# Patient Record
Sex: Male | Born: 1975 | Race: Black or African American | Hispanic: No | Marital: Single | Smoking: Never smoker
Health system: Southern US, Community
[De-identification: ages and names within clinical notes are randomized; demographics above are authoritative.]

## PROBLEM LIST (undated history)

## (undated) DIAGNOSIS — E7800 Pure hypercholesterolemia, unspecified: Secondary | ICD-10-CM

## (undated) DIAGNOSIS — E119 Type 2 diabetes mellitus without complications: Secondary | ICD-10-CM

## (undated) DIAGNOSIS — D649 Anemia, unspecified: Secondary | ICD-10-CM

## (undated) DIAGNOSIS — E559 Vitamin D deficiency, unspecified: Secondary | ICD-10-CM

## (undated) DIAGNOSIS — B338 Other specified viral diseases: Secondary | ICD-10-CM

## (undated) DIAGNOSIS — K602 Anal fissure, unspecified: Secondary | ICD-10-CM

## (undated) DIAGNOSIS — E538 Deficiency of other specified B group vitamins: Secondary | ICD-10-CM

## (undated) DIAGNOSIS — A048 Other specified bacterial intestinal infections: Secondary | ICD-10-CM

## (undated) DIAGNOSIS — I10 Essential (primary) hypertension: Secondary | ICD-10-CM

## (undated) DIAGNOSIS — G4733 Obstructive sleep apnea (adult) (pediatric): Secondary | ICD-10-CM

## (undated) DIAGNOSIS — E349 Endocrine disorder, unspecified: Secondary | ICD-10-CM

## (undated) HISTORY — DX: Other specified bacterial intestinal infections: A04.8

## (undated) HISTORY — DX: Deficiency of other specified B group vitamins: E53.8

## (undated) HISTORY — DX: Obstructive sleep apnea (adult) (pediatric): G47.33

## (undated) HISTORY — DX: Anal fissure, unspecified: K60.2

## (undated) HISTORY — DX: Other specified viral diseases: B33.8

## (undated) HISTORY — DX: Vitamin D deficiency, unspecified: E55.9

## (undated) HISTORY — DX: Morbid (severe) obesity due to excess calories: E66.01

## (undated) HISTORY — DX: Endocrine disorder, unspecified: E34.9

## (undated) HISTORY — DX: Anemia, unspecified: D64.9

## (undated) HISTORY — DX: Hypomagnesemia: E83.42

## (undated) HISTORY — DX: Type 2 diabetes mellitus without complications: E11.9

## (undated) HISTORY — PX: KNEE ARTHROSCOPY W/ ACL RECONSTRUCTION: SHX1858

---

## 2015-05-23 ENCOUNTER — Emergency Department (HOSPITAL_BASED_OUTPATIENT_CLINIC_OR_DEPARTMENT_OTHER): Payer: Self-pay

## 2015-05-23 ENCOUNTER — Encounter (HOSPITAL_BASED_OUTPATIENT_CLINIC_OR_DEPARTMENT_OTHER): Payer: Self-pay | Admitting: *Deleted

## 2015-05-23 ENCOUNTER — Emergency Department (HOSPITAL_BASED_OUTPATIENT_CLINIC_OR_DEPARTMENT_OTHER)
Admission: EM | Admit: 2015-05-23 | Discharge: 2015-05-23 | Disposition: A | Discharge status: Home / Self Care | Payer: Self-pay | Attending: Emergency Medicine | Admitting: Emergency Medicine

## 2015-05-23 DIAGNOSIS — S62309A Unspecified fracture of unspecified metacarpal bone, initial encounter for closed fracture: Secondary | ICD-10-CM

## 2015-05-23 DIAGNOSIS — Y9389 Activity, other specified: Secondary | ICD-10-CM | POA: Insufficient documentation

## 2015-05-23 DIAGNOSIS — S62336A Displaced fracture of neck of fifth metacarpal bone, right hand, initial encounter for closed fracture: Secondary | ICD-10-CM | POA: Insufficient documentation

## 2015-05-23 DIAGNOSIS — W231XXA Caught, crushed, jammed, or pinched between stationary objects, initial encounter: Secondary | ICD-10-CM | POA: Insufficient documentation

## 2015-05-23 DIAGNOSIS — Y9289 Other specified places as the place of occurrence of the external cause: Secondary | ICD-10-CM | POA: Insufficient documentation

## 2015-05-23 DIAGNOSIS — Y998 Other external cause status: Secondary | ICD-10-CM | POA: Insufficient documentation

## 2015-05-23 MED ORDER — OXYCODONE-ACETAMINOPHEN 5-325 MG PO TABS: 1.0000 | ORAL_TABLET | Freq: Four times a day (QID) | ORAL | Status: DC | PRN

## 2015-05-23 MED ORDER — KETOROLAC TROMETHAMINE 60 MG/2ML IM SOLN
60.0000 mg | Freq: Once | INTRAMUSCULAR | Status: AC
  Administered 2015-05-23: 60 mg via INTRAMUSCULAR
  Filled 2015-05-23: qty 2
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
## 2015-05-23 NOTE — ED Notes (Signed)
Right hand injury. It was caught in a door 2 days ago. Swelling, pain and bruising.

## 2015-05-23 NOTE — Discharge Instructions (Signed)
Boxer's Fracture °Boxer's fracture is a broken bone (fracture) of the fourth or fifth metacarpal (ring or pinky finger). The metacarpal bones connect the wrist to the fingers and make up the arch of the hand. Boxer's fracture occurs toward the body (proximal) from the first knuckle. This injury is known as a boxer's fracture, because it often occurs from hitting an object with a closed fist. °SYMPTOMS  °· Severe pain at the time of injury. °· Pain and swelling around the first knuckle on the fourth or fifth finger. °· Bruising (contusion) in the area within 48 hours of injury. °· Visible deformity, such as a pushed down knuckle. This can occur if the fracture is complete, and the bone fragments separate enough to distort normal body shape. °· Numbness or paralysis from swelling in the hand, causing pressure on the blood vessels or nerves (uncommon). °CAUSES  °· Direct injury (trauma), such as a striking blow with the fist. °· Indirect stress to the hand, such as twisting or violent muscle contraction (uncommon). °RISK INCREASES WITH: °· Punching an object with an unprotected knuckle. °· Contact sports (football, rugby). °· Sports that require hitting (boxing, martial arts). °· History of bone or joint disease (osteoporosis). °PREVENTION °· Maintain physical fitness: °¨ Muscle strength and flexibility. °¨ Endurance. °¨ Cardiovascular fitness. °· For participation in contact sports, wear proper protective equipment for the hand and make sure it fits properly. °· Learn and use proper technique when hitting or punching. °PROGNOSIS  °When proper treatment is given, to ensure normal alignment of the bones, healing can usually be expected in 4 to 6 weeks. Occasionally, surgery is necessary.  °RELATED COMPLICATIONS  °· Bone does not heal back together (nonunion). °· Bone heals together in an improper position (malunion), causing twisting of the finger when making a fist. °· Chronic pain, stiffness, or swelling of the  hand. °· Excessive bleeding in the hand, causing pressure and injury to nerves and blood vessels (rare). °· Stopping of normal hand growth in children. °· Infection of the wound, if skin is broken over the fracture (open fracture), or at the incision site if surgery is performed. °· Shortening of injured bones. °· Bony bump (prominence) in the palm or loss of shape of the knuckles. °· Pain and weakness when gripping. °· Arthritis of the affected joint, if the fracture goes into the joint, after repeated injury, or after delayed treatment. °· Scarring around the knuckle, and limited motion. °TREATMENT  °Treatment varies, depending on the injury. The place in the hand where the injury occurs has a great deal of motion, which allows the hand to move properly. If the fracture is not aligned properly, this function may be decreased. If the bone ends are in proper alignment, treatment first involves ice and elevation of the injured hand, at or above heart level, to reduce inflammation. Pain medicines help to relieve pain. Treatment also involves restraint by splinting, bandaging, casting, or bracing for 4 or more weeks.  °If the fracture is out of alignment (displaced), or it involves the joint, surgery is usually recommended. Surgery typically involves cutting through the skin to place removable pins, screws, and sometimes plates over the fracture. After surgery, the bone and joint are restrained for 4 or more weeks. After restraint (with or without surgery), stretching and strengthening exercises are needed to regain proper strength and function of the joint. Exercises may be done at home or with the assistance of a therapist. Depending on the sport and position played, a   brace or splint may be recommended when first returning to sports.  °MEDICATION  °· If pain medication is necessary, nonsteroidal anti-inflammatory medications, such as aspirin and ibuprofen, or other minor pain relievers, such as acetaminophen, are  often recommended. °· Do not take pain medication for 7 days before surgery. °· Prescription pain relievers may be necessary. Use only as directed and only as much as you need. °COLD THERAPY °Cold treatment (icing) relieves pain and reduces inflammation. Cold treatment should be applied for 10 to 15 minutes every 2 to 3 hours for inflammation and pain, and immediately after any activity that aggravates your symptoms. Use ice packs or an ice massage. °SEEK MEDICAL CARE IF:  °· Pain, tenderness, or swelling gets worse, despite treatment. °· You experience pain, numbness, or coldness in the hand. °· Blue, gray, or dark color appears in the fingernails. °· You develop signs of infection, after surgery (fever, increased pain, swelling, redness, drainage of fluids, or bleeding in the surgical area). °· You feel you have reinjured the hand. °· New, unexplained symptoms develop. (Drugs used in treatment may produce side effects.) °Document Released: 12/02/2005 Document Revised: 02/24/2012 Document Reviewed: 03/16/2009 °ExitCare® Patient Information ©2015 [employer]. This information is not intended to replace advice given to you by your health care provider. Make sure you discuss any questions you have with your health care provider. ° °

## 2015-06-01 ENCOUNTER — Ambulatory Visit: Payer: Self-pay | Attending: Orthopedic Surgery | Admitting: Occupational Therapy

## 2015-06-01 ENCOUNTER — Encounter: Payer: Self-pay | Admitting: Occupational Therapy

## 2015-06-01 DIAGNOSIS — M25641 Stiffness of right hand, not elsewhere classified: Secondary | ICD-10-CM | POA: Insufficient documentation

## 2015-06-01 DIAGNOSIS — M79641 Pain in right hand: Secondary | ICD-10-CM | POA: Insufficient documentation

## 2015-06-01 NOTE — Therapy (Signed)
[facility] [address] [place], [place], [address] Phone: [phone]   Fax:  [phone]  Occupational Therapy Evaluation  Patient Details  Name: Dylan Munoz MRN: [id number] Date of Birth: 13-Dec-1976 Referring Provider:  Betha Loa, MD  Encounter Date: 06/01/2015      OT End of Session - 06/01/15 0934    Visit Number 1   Date for OT Re-Evaluation 08/01/15   Authorization Type self pay   OT Start Time 0800   OT Stop Time 0900   OT Time Calculation (min) 60 min   Activity Tolerance Patient tolerated treatment well      History reviewed. No pertinent past medical history.  History reviewed. No pertinent past surgical history.  There were no vitals filed for this visit.  Visit Diagnosis:  Pain in joint, hand, right - Plan: Ot plan of care cert/re-cert  Stiffness of hand joint, right - Plan: Ot plan of care cert/re-cert      Subjective Assessment - 06/01/15 0810    Subjective  I didn't have surgery, but it happened 2 Sunday's ago (June 5th) and then I got the soft cast the following Tuesday (June 7th)   Pertinent History none   Currently in Pain? Yes   Pain Score 7    Pain Location Hand   Pain Orientation Right   Pain Descriptors / Indicators Aching;Throbbing   Pain Type Acute pain   Pain Onset In the past 7 days   Pain Frequency Intermittent   Aggravating Factors  certain movements   Pain Relieving Factors nothing           [facility] OT Assessment - 06/01/15 0001    Assessment   Diagnosis Rt small metacarpal neck fracture  no surgery   Onset Date 05/21/15  Soft cast placed 05/23/15 for immobolization until splint fabrication today   Assessment Pt arrived fully wrapped and protected from 05/23/15   Prior Therapy none   Precautions   Precautions --  splint on at all times (except hygiene care)   Required Braces or Orthoses Other Brace/Splint  ulnar gutter (wrist neutral, MP's flexed, IP's ext)  per MD orders   Home  Environment   Lives With Son  39 y.o.    Prior Function   Level of Independence Independent   Vocation Full time employment   Vocation Requirements working in [employer]   ADL   ADL comments Pt mod Independence with BADLS (wearing slip on shoes and elastic pants). Requires assist from son for IADLS   Written Expression   Dominant Hand Right   Edema   Edema Mild Rt hand                  OT Treatments/Exercises (OP) - 06/01/15 0001    Splinting   Splinting Pt arrived with soft cast, which was carefully removed and hand cleaned/dried while keeping immobolized in same position he arrived. Fabricated and fitted ulnar gutter splint with wrist neutral, MP's flexed and IP's extended (of 4th-5th digits) per MD orders. Issued splint                OT Education - 06/01/15 0904    Education provided Yes   Education Details Splint wear and care, precautions   Person(s) Educated Patient   Methods Explanation;Handout   Comprehension Verbalized understanding          OT Short Term Goals - 06/01/15 0939    OT SHORT TERM GOAL #1   Title Independent  w/ splint wear and care - due 07/01/15   Baseline issued today, may need adjustments/review   Time 4   Period Weeks   Status On-going   OT SHORT TERM GOAL #2   Title Verbalize understanding with precautions of Rt hand   Time 4   Period Weeks   Status Achieved           OT Long Term Goals - 06/01/15 0940    OT LONG TERM GOAL #1   Title Independent w/ HEP (if warranted) - due 08/01/15   Time 8   Period Weeks   Status New   OT LONG TERM GOAL #2   Title Pt to return to using Rt hand as dominant hand for BADLS    Baseline DEPENDENT    Time 8   Period Weeks   Status New   OT LONG TERM GOAL #3   Title Pt to demo composite finger flexion to 90% or greater    Time 8   Period Weeks   Status New               Plan - 06/01/15 0935    Clinical Impression Statement Pt is a 39 y.o. male  who presents to outpatient rehab with Rt small metacarpal neck fracture (no surgery) fully wrapped/protected since June 7th, 2016 per pt report. Pt arrives today for splint fabrication. Issued splint with no current concerns/problems   Pt will benefit from skilled therapeutic intervention in order to improve on the following deficits (Retired) Increased edema;Pain;Decreased range of motion;Decreased strength;Decreased knowledge of precautions;Impaired UE functional use   Rehab Potential Good   OT Frequency 1x / week   OT Duration 8 weeks   OT Treatment/Interventions Self-care/ADL training;Therapeutic exercise;Electrical Stimulation;Moist Heat;Splinting;Fluidtherapy;Scar mobilization;Patient/family education;DME and/or AE instruction;Manual Therapy;Passive range of motion;Therapeutic activities;Ultrasound   Plan splint adjustments prn, will begin f/u therapy if/when MD deems appropriate   Consulted and Agree with Plan of Care Patient        Problem List There are no active problems to display for this patient.   Kelli Churn, OTR/L 06/01/2015, 9:45 AM  [facility] [address] [place], [place], [address] Phone: [phone]   Fax:  [phone]

## 2015-06-01 NOTE — Patient Instructions (Signed)
SPLINT WEAR AND CARE   WEARING SCHEDULE:  Wear splint at ALL times except for hygiene care   PURPOSE:  To prevent movement and for protection until injury can heal  CARE OF SPLINT:  Keep splint away from heat sources including: stove, radiator or furnace, or a car in sunlight. The splint can melt and will no longer fit you properly  Keep away from pets and children  Clean the splint with rubbing alcohol 1-2 times per day.  * During this time, make sure you also clean your hand/arm as instructed by your therapist and/or perform dressing changes as needed. Then dry hand/arm completely before replacing splint. (When cleaning hand/arm, keep it immobilized in same position until splint is replaced)  PRECAUTIONS/POTENTIAL PROBLEMS: *If you notice or experience increased pain, swelling, numbness, or a lingering reddened area from the splint: Contact your therapist immediately by calling [phone]. You must wear the splint for protection, but we will get you scheduled for adjustments as quickly as possible.  (If only straps or hooks need to be replaced and NO adjustments to the splint need to be made, just call the office ahead and let them know you are coming in)  If you have any medical concerns or signs of infection, please call your doctor immediately    

## 2015-07-21 ENCOUNTER — Ambulatory Visit: Payer: Self-pay | Attending: Orthopedic Surgery | Admitting: Occupational Therapy

## 2015-07-21 DIAGNOSIS — M79642 Pain in left hand: Secondary | ICD-10-CM | POA: Insufficient documentation

## 2015-07-21 DIAGNOSIS — M25642 Stiffness of left hand, not elsewhere classified: Secondary | ICD-10-CM | POA: Insufficient documentation

## 2015-07-21 NOTE — Patient Instructions (Signed)
WEARING SCHEDULE:  ?Wear splint at ALL times except for hygiene care  ? ?PURPOSE:  ?To prevent movement and for protection until injury can heal ? ?CARE OF SPLINT:  ?Keep splint away from heat sources including: stove, radiator or furnace, or a car in sunlight. The splint can melt and will no longer fit you properly ? ?Keep away from pets and children ? ?Clean the splint with rubbing alcohol 1-2 times per day.  ?* During this time, make sure you also clean your hand/arm as instructed by your therapist and/or perform dressing changes as needed. Then dry hand/arm completely before replacing splint. (When cleaning hand/arm, keep it immobilized in same position until splint is replaced) ? ?PRECAUTIONS/POTENTIAL PROBLEMS: ?*If you notice or experience increased pain, swelling, numbness, or a lingering reddened area from the splint: ?Contact your therapist immediately by calling [phone]. You must wear the splint for protection, but we will get you scheduled for adjustments as quickly as possible.  ?(If only straps or hooks need to be replaced and NO adjustments to the splint need to be made, just call the office ahead and let them know you are coming in) ? ?If you have any medical concerns or signs of infection, please call your doctor immediately ?  ?

## 2015-07-21 NOTE — Therapy (Signed)
[facility] [address] [place], [place], [address] Phone: [phone]   Fax:  [phone]  Occupational Therapy Evaluation  Patient Details  Name: Dylan Munoz MRN: [id number] Date of Birth: 1976/04/03 Referring Provider:  Betha Loa, MD  Encounter Date: 07/21/2015      OT End of Session - 07/21/15 1245    Visit Number 1   Number of Visits 4   Date for OT Re-Evaluation 09/18/15   Authorization Type self pay   OT Start Time 1130   OT Stop Time 1225   OT Time Calculation (min) 55 min      No past medical history on file.  No past surgical history on file.  There were no vitals filed for this visit.  Visit Diagnosis:  Stiffness of hand joint, left - Plan: Ot plan of care cert/re-cert  Pain in joint, hand, left - Plan: Ot plan of care cert/re-cert      Subjective Assessment - 07/21/15 1125    Subjective  Pt reports no pain today, it was hurting last week.   Patient Stated Goals get splint   Currently in Pain? No/denies           [facility] OT Assessment - 07/21/15 0001    Assessment   Diagnosis left small metacarpal fx   Onset Date --  approx 1 week ago, pt saw MD today 07/21/15   Assessment Pt sustained a left small metacarpal fx last week and saw MD today for confirmation. Pt arrived unprotected with prescription for ulnar gutter splint.   Prior Therapy splinting for RUE   Precautions   Precautions Other (comment)   Precaution Comments splint at all times except hygeine   Required Braces or Orthoses Other Brace/Splint  ulnar gutter    Restrictions   Weight Bearing Restrictions Yes   LUE Weight Bearing --  no heavy use of LUE   Home  Environment   Lives With Son   Prior Function   Level of Independence Independent   Vocation Full time employment   Vocation Requirements works at [employer] station   ADL   ADL comments Pt is modified independent with basic ADLS using RUE   Written Expression   Dominant Hand Right   Edema   Edema mild left hand                  OT Treatments/Exercises (OP) - 07/21/15 0001    Splinting   Splinting Pt arrived unprotected. He was fitted with an ulnar gutter splint with MP's slightly flexed and IP's extended, wrist in neutral. Pt was educated in splint wear, care and precautions. (However pt may be non compliant as he asked therapist "Is this really necessary prior to fabrication of splint" Therapist reinforced importance of splint wear for proper healing.               OT Education - 07/21/15 1309    Education provided Yes   Education Details splint wear, care and precautions   Person(s) Educated Patient   Methods Explanation;Demonstration;Verbal cues;Handout   Comprehension Verbalized understanding             OT Long Term Goals - 07/21/15 1304    OT LONG TERM GOAL #1   Title I with splint wear care and precautions following 1-2 weeks of wear to ensure proper fit.   Time 8   Period Weeks   Status New  Plan - 07/21/15 1247    Clinical Impression Statement Pt presents to occupational therapy with a left small metacarpal fx. Pt can benefit from skilled OT for splinting.(Pt arrived unprotected)   Pt will benefit from skilled therapeutic intervention in order to improve on the following deficits (Retired) Increased edema;Pain;Decreased range of motion;Decreased strength;Decreased knowledge of precautions;Impaired UE functional use   Rehab Potential Good   OT Frequency --  4 visits   OT Duration 8 weeks   OT Treatment/Interventions Self-care/ADL training;Therapeutic exercise;Electrical Stimulation;Moist Heat;Splinting;Fluidtherapy;Scar mobilization;Patient/family education;DME and/or AE instruction;Manual Therapy;Passive range of motion;Therapeutic activities;Ultrasound   Plan splint adjustments PRN   Consulted and Agree with Plan of Care Patient        Problem List There are no active problems  to display for this patient.   RINE,KATHRYN 07/21/2015, 1:09 PM Keene Breath, OTR/L [phone] Phone: [phone] 1:09 PM 07/21/2015 [facility] [address] [place], [place], [address] Phone: [phone]   Fax:  [phone]

## 2015-11-25 ENCOUNTER — Emergency Department (HOSPITAL_BASED_OUTPATIENT_CLINIC_OR_DEPARTMENT_OTHER)
Admission: EM | Admit: 2015-11-25 | Discharge: 2015-11-25 | Disposition: A | Discharge status: Home / Self Care | Payer: No Typology Code available for payment source | Attending: Emergency Medicine | Admitting: Emergency Medicine

## 2015-11-25 ENCOUNTER — Emergency Department (HOSPITAL_BASED_OUTPATIENT_CLINIC_OR_DEPARTMENT_OTHER): Payer: No Typology Code available for payment source

## 2015-11-25 ENCOUNTER — Encounter (HOSPITAL_BASED_OUTPATIENT_CLINIC_OR_DEPARTMENT_OTHER): Payer: Self-pay | Admitting: *Deleted

## 2015-11-25 DIAGNOSIS — S9031XA Contusion of right foot, initial encounter: Secondary | ICD-10-CM | POA: Insufficient documentation

## 2015-11-25 DIAGNOSIS — Y92410 Unspecified street and highway as the place of occurrence of the external cause: Secondary | ICD-10-CM | POA: Diagnosis not present

## 2015-11-25 DIAGNOSIS — S99921A Unspecified injury of right foot, initial encounter: Secondary | ICD-10-CM | POA: Diagnosis present

## 2015-11-25 DIAGNOSIS — Y998 Other external cause status: Secondary | ICD-10-CM | POA: Insufficient documentation

## 2015-11-25 DIAGNOSIS — Y9389 Activity, other specified: Secondary | ICD-10-CM | POA: Insufficient documentation

## 2015-11-25 DIAGNOSIS — Z79899 Other long term (current) drug therapy: Secondary | ICD-10-CM | POA: Insufficient documentation

## 2015-11-25 MED ORDER — METHOCARBAMOL 500 MG PO TABS
ORAL_TABLET | ORAL | Status: AC
  Filled 2015-11-25: qty 2

## 2015-11-25 MED ORDER — ACETAMINOPHEN 500 MG PO TABS
1000.0000 mg | ORAL_TABLET | Freq: Once | ORAL | Status: AC
  Administered 2015-11-25: 1000 mg via ORAL
  Filled 2015-11-25: qty 2

## 2015-11-25 MED ORDER — METHOCARBAMOL 500 MG PO TABS
1000.0000 mg | ORAL_TABLET | Freq: Once | ORAL | Status: AC
  Administered 2015-11-25: 1000 mg via ORAL
  Filled 2015-11-25: qty 2

## 2015-11-25 MED ORDER — MELOXICAM 15 MG PO TABS: 15.0000 mg | ORAL_TABLET | Freq: Every day | ORAL | Status: DC

## 2015-11-25 MED ORDER — METHOCARBAMOL 500 MG PO TABS: 500.0000 mg | ORAL_TABLET | Freq: Two times a day (BID) | ORAL | Status: DC
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
## 2015-11-25 NOTE — Discharge Instructions (Signed)

## 2016-04-17 ENCOUNTER — Encounter: Payer: Self-pay | Admitting: Family Medicine

## 2016-04-17 ENCOUNTER — Ambulatory Visit (INDEPENDENT_AMBULATORY_CARE_PROVIDER_SITE_OTHER): Payer: 59 | Admitting: Family Medicine

## 2016-04-17 VITALS — BP 154/102 | HR 101 | Ht 72.0 in | Wt 359.0 lb

## 2016-04-17 DIAGNOSIS — E291 Testicular hypofunction: Secondary | ICD-10-CM

## 2016-04-17 DIAGNOSIS — G4730 Sleep apnea, unspecified: Secondary | ICD-10-CM

## 2016-04-17 DIAGNOSIS — Z23 Encounter for immunization: Secondary | ICD-10-CM | POA: Diagnosis not present

## 2016-04-17 DIAGNOSIS — G4733 Obstructive sleep apnea (adult) (pediatric): Secondary | ICD-10-CM | POA: Insufficient documentation

## 2016-04-17 DIAGNOSIS — M25562 Pain in left knee: Secondary | ICD-10-CM

## 2016-04-17 DIAGNOSIS — R7303 Prediabetes: Secondary | ICD-10-CM

## 2016-04-17 DIAGNOSIS — I10 Essential (primary) hypertension: Secondary | ICD-10-CM | POA: Diagnosis not present

## 2016-04-17 DIAGNOSIS — E559 Vitamin D deficiency, unspecified: Secondary | ICD-10-CM

## 2016-04-17 DIAGNOSIS — E785 Hyperlipidemia, unspecified: Secondary | ICD-10-CM

## 2016-04-17 DIAGNOSIS — E349 Endocrine disorder, unspecified: Secondary | ICD-10-CM

## 2016-04-17 DIAGNOSIS — R0602 Shortness of breath: Secondary | ICD-10-CM

## 2016-04-17 DIAGNOSIS — M79602 Pain in left arm: Secondary | ICD-10-CM

## 2016-04-17 DIAGNOSIS — M79601 Pain in right arm: Secondary | ICD-10-CM

## 2016-04-17 DIAGNOSIS — M79603 Pain in arm, unspecified: Secondary | ICD-10-CM

## 2016-04-17 DIAGNOSIS — R202 Paresthesia of skin: Secondary | ICD-10-CM

## 2016-04-17 HISTORY — DX: Morbid (severe) obesity due to excess calories: E66.01

## 2016-04-17 HISTORY — DX: Obstructive sleep apnea (adult) (pediatric): G47.33

## 2016-04-17 MED ORDER — LISINOPRIL-HYDROCHLOROTHIAZIDE 10-12.5 MG PO TABS: 1.0000 | ORAL_TABLET | Freq: Every day | ORAL | Status: DC

## 2016-04-17 NOTE — Assessment & Plan Note (Addendum)
Records 10 years are not obtainable. Repeat sleep study

## 2016-04-17 NOTE — Assessment & Plan Note (Signed)
Very likely excessive calories are we will perform a endocrine/metabolic workup including TSH and testosterone. Recommend patient keep calorie log with myfitnesspal

## 2016-04-17 NOTE — Assessment & Plan Note (Signed)
EKG normal. Basic fasting labs and blood pressure control. His symptoms continue will likely obtain echocardiogram and stress test.

## 2016-04-17 NOTE — Assessment & Plan Note (Signed)
Unclear etiology continue to follow

## 2016-04-17 NOTE — Patient Instructions (Addendum)
Thank you for coming in today. Take the blood pressure pill.  Get fasting labs in the morning in the near future.  Return in 1 month.  Call or go to the emergency room if you get worse, have trouble breathing, have chest pains, or palpitations.  Use the application Myfitnesspal to keep track of calories.  We will also order a home sleep study to check for sleep apnea.    Hydrochlorothiazide, HCTZ; Lisinopril tablets What is this medicine? HYDROCHLOROTHIAZIDE; LISINOPRIL (hye droe klor oh THYE a zide; lyse IN oh pril) is a combination of a diuretic and an ACE inhibitor. It is used to treat high blood pressure. This medicine may be used for other purposes; ask your health care provider or pharmacist if you have questions. What should I tell my health care provider before I take this medicine? They need to know if you have any of these conditions: -bone marrow disease -decreased urine -heart or blood vessel disease -if you are on a special diet like a low salt diet -immune system problems, like lupus -kidney disease -liver disease -previous swelling of the tongue, face, or lips with difficulty breathing, difficulty swallowing, hoarseness, or tightening of the throat -recent heart attack or stroke -an unusual or allergic reaction to lisinopril, hydrochlorothiazide, sulfa drugs, other medicines, insect venom, foods, dyes, or preservatives -pregnant or trying to get pregnant -breast-feeding How should I use this medicine? Take this medicine by mouth with a glass of water. Follow the directions on the prescription label. You can take it with or without food. If it upsets your stomach, take it with food. Take your medicine at regular intervals. Do not take it more often than directed. Do not stop taking except on your doctor's advice. Talk to your pediatrician regarding the use of this medicine in children. Special care may be needed. Overdosage: If you think you have taken too much of this  medicine contact a poison control center or emergency room at once. NOTE: This medicine is only for you. Do not share this medicine with others. What if I miss a dose? If you miss a dose, take it as soon as you can. If it is almost time for your next dose, take only that dose. Do not take double or extra doses. What may interact with this medicine? -barbiturates like phenobarbital -blood pressure medicines -corticosteroids like prednisone -diabetic medications -diuretics, especially triamterene, spironolactone or amiloride -lithium -NSAIDs like ibuprofen -potassium salts or potassium supplements -prescription pain medicines -skeletal muscle relaxants like tubocurarine -some cholesterol lowering medications like cholestyramine or colestipol This list may not describe all possible interactions. Give your health care provider a list of all the medicines, herbs, non-prescription drugs, or dietary supplements you use. Also tell them if you smoke, drink alcohol, or use illegal drugs. Some items may interact with your medicine. What should I watch for while using this medicine? Visit your doctor or health care professional for regular checks on your progress. Check your blood pressure as directed. Ask your doctor or health care professional what your blood pressure should be and when you should contact him or her. Call your doctor or health care professional if you notice an irregular or fast heart beat. You must not get dehydrated. Ask your doctor or health care professional how much fluid you need to drink a day. Check with him or her if you get an attack of severe diarrhea, nausea and vomiting, or if you sweat a lot. The loss of too much body fluid  can make it dangerous for you to take this medicine. Women should inform their doctor if they wish to become pregnant or think they might be pregnant. There is a potential for serious side effects to an unborn child. Talk to your health care professional  or pharmacist for more information. You may get drowsy or dizzy. Do not drive, use machinery, or do anything that needs mental alertness until you know how this drug affects you. Do not stand or sit up quickly, especially if you are an older patient. This reduces the risk of dizzy or fainting spells. Alcohol can make you more drowsy and dizzy. Avoid alcoholic drinks. This medicine may affect your blood sugar level. If you have diabetes, check with your doctor or health care professional before changing the dose of your diabetic medicine. Avoid salt substitutes unless you are told otherwise by your doctor or health care professional. This medicine can make you more sensitive to the sun. Keep out of the sun. If you cannot avoid being in the sun, wear protective clothing and use sunscreen. Do not use sun lamps or tanning beds/booths. Do not treat yourself for coughs, colds, or pain while you are taking this medicine without asking your doctor or health care professional for advice. Some ingredients may increase your blood pressure. What side effects may I notice from receiving this medicine? Side effects that you should report to your doctor or health care professional as soon as possible: -changes in vision -confusion, dizziness, light headedness or fainting spells -decreased amount of urine passed -difficulty breathing or swallowing, hoarseness, or tightening of the throat -eye pain -fast or irregular heart beat, palpitations, or chest pain -muscle cramps -nausea and vomiting -persistent dry cough -redness, blistering, peeling or loosening of the skin, including inside the mouth -stomach pain -swelling of your face, lips, tongue, hands, or feet -unusual rash, bleeding or bruising, or pinpoint red spots on the skin -worsened gout pain -yellowing of the eyes or skin Side effects that usually do not require medical attention (report to your doctor or health care professional if they continue or  are bothersome): -change in sex drive or performance -cough -headache This list may not describe all possible side effects. Call your doctor for medical advice about side effects. You may report side effects to FDA at 1-800-FDA-1088. Where should I keep my medicine? Keep out of the reach of children. Store at room temperature between 20 and 25 degrees C (68 and 77 degrees F). Protect from moisture and excessive light. Keep container tightly closed. Throw away any unused medicine after the expiration date. NOTE: This sheet is a summary. It may not cover all possible information. If you have questions about this medicine, talk to your doctor, pharmacist, or health care provider.    2016, Elsevier/Gold Standard. (2010-08-22 13:33:52)

## 2016-04-17 NOTE — Progress Notes (Signed)
       Dylan MunozDerrell Munoz is a 40 y.o. male [facility] today for establish care and discuss hypertension, left knee pain, shortness of breath was exertion, obesity, and sleep apnea.  1) hypertension: Patient has been told previously he has elevated blood pressure. He denies any chest pains currently or palpitations or severe shortness of breath. He feels well. He is reluctant to take medications if possible.  2) left knee pain: Ongoing intermittently for years now. Patient denies any recent injury. He does not take medicines for it. Has not had a workup for this issue previously. Symptoms are worse with activity and better with rest.  3) shortness of breath with exertion. Patient occasionally becomes short of breath with exertion. In the past and episode of chest pain in 2015 that was worked up with negative troponins and a reportedly normal EKG. He feels well currently with no recent chest pain episodes. He denies any palpitations.  4) obesity: Patient has a history of obesity. He's tried to lose weight in the past with some mild modification of his activity and diet. He does not count calories. He continues to gain weight. He is concerning may have a thyroid problem.  5) sleep apnea: Patient 10 years ago was diagnosed with sleep apnea but was unable to get a CPAP machine at that time due to lack of health insurance. He notes continued snoring.  6) hand numbness and tingling: Patient notes intermittent left hand numbness and tingling on the dorsal aspect of the hand. He denies any injury. This occurs with activity and out of sleep.    History reviewed. No pertinent past medical history. Past Surgical History  Procedure Laterality Date  . Knee arthroscopy w/ acl reconstruction     Social History  Substance Use Topics  . Smoking status: Never Smoker   . Smokeless tobacco:  Current User    Types: Chew  . Alcohol Use: 0.0 oz/week    0 Standard drinks or equivalent per week     Comment: 3x's per week   family history includes Diabetes in his paternal uncle; Heart disease in his father.  ROS as above No headache, visual changes, nausea, vomiting, diarrhea, constipation, dizziness, abdominal pain, skin rash, fevers, chills, night sweats, weight loss, swollen lymph nodes, body aches, joint swelling, muscle aches, chest pain, mood changes, visual or auditory hallucinations.    Medications: Current Outpatient Prescriptions  Medication Sig Dispense Refill  . lisinopril-hydrochlorothiazide (PRINZIDE,ZESTORETIC) 10-12.5 MG tablet Take 1 tablet by mouth daily. 30 tablet 0   No current facility-administered medications for this visit.   No Known Allergies   Exam:  BP 154/102 mmHg  Pulse 101  Ht 6' (1.829 m)  Wt 359 lb (162.841 kg)  BMI 48.68 kg/m2 Gen: Well NAD Morbidly obese HEENT: EOMI,  MMM Lungs: Normal work of breathing. CTABL Heart: RRR no MRG Abd: NABS, Soft. Nondistended, Nontender Exts: Brisk capillary refill, warm and well perfused.  Neck: Nontender to midline normal neck motion Upper extremity strength is equal and normal throughout. Sensation pulses capillary refill are intact throughout upper extremities. Negative Tinel's bilateral carpal tunnel and cubital tunnel.   12-lead EKG: Sinus tachycardia at 101 bpm. No ST segment elevation or depression. Aside from tachycardia normal EKG.  No results found for this or any previous visit (from the past 24 hour(s)). No results found.  Tdap given prior to discharge.   Please see individual assessment and plan sections.

## 2016-04-17 NOTE — Assessment & Plan Note (Signed)
Start senna plus/hydrochlorothiazide. Check basic fasting labs. Recheck in one month. Workup sleep apnea hopefully will treat which will also improve blood pressure.

## 2016-04-17 NOTE — Assessment & Plan Note (Signed)
Deferred to the next visit

## 2016-04-18 LAB — CBC
HEMATOCRIT: 43.6 % (ref 38.5–50.0)
Hemoglobin: 14.9 g/dL (ref 13.2–17.1)
MCH: 32.2 pg (ref 27.0–33.0)
MCHC: 34.2 g/dL (ref 32.0–36.0)
MCV: 94.2 fL (ref 80.0–100.0)
MPV: 9.3 fL (ref 7.5–12.5)
PLATELETS: 338 10*3/uL (ref 140–400)
RBC: 4.63 MIL/uL (ref 4.20–5.80)
RDW: 15.7 % — AB (ref 11.0–15.0)
WBC: 6.6 10*3/uL (ref 3.8–10.8)

## 2016-04-18 LAB — LIPID PANEL
CHOLESTEROL: 433 mg/dL — AB (ref 125–200)
HDL: 39 mg/dL — AB (ref >=40)
LDL Cholesterol: 357 mg/dL — ABNORMAL HIGH (ref <130)
TRIGLYCERIDES: 185 mg/dL — AB (ref <150)
Total CHOL/HDL Ratio: 11.1 Ratio — ABNORMAL HIGH (ref <=5.0)
VLDL: 37 mg/dL — AB (ref <30)

## 2016-04-18 LAB — HEMOGLOBIN A1C
Hgb A1c MFr Bld: 6.2 % — ABNORMAL HIGH (ref <5.7)
Mean Plasma Glucose: 131 mg/dL

## 2016-04-18 LAB — COMPREHENSIVE METABOLIC PANEL
ALT: 57 U/L — AB (ref 9–46)
AST: 56 U/L — ABNORMAL HIGH (ref 10–40)
Albumin: 4.1 g/dL (ref 3.6–5.1)
Alkaline Phosphatase: 95 U/L (ref 40–115)
BUN: 11 mg/dL (ref 7–25)
CALCIUM: 9.2 mg/dL (ref 8.6–10.3)
CHLORIDE: 103 mmol/L (ref 98–110)
CO2: 28 mmol/L (ref 20–31)
Creat: 0.85 mg/dL (ref 0.60–1.35)
GLUCOSE: 105 mg/dL — AB (ref 65–99)
POTASSIUM: 4.7 mmol/L (ref 3.5–5.3)
Sodium: 140 mmol/L (ref 135–146)
Total Bilirubin: 0.4 mg/dL (ref 0.2–1.2)
Total Protein: 6.9 g/dL (ref 6.1–8.1)

## 2016-04-18 LAB — TSH: TSH: 1.3 mIU/L (ref 0.40–4.50)

## 2016-04-18 LAB — URIC ACID: Uric Acid, Serum: 6.9 mg/dL (ref 4.0–7.8)

## 2016-04-19 DIAGNOSIS — R7303 Prediabetes: Secondary | ICD-10-CM | POA: Insufficient documentation

## 2016-04-19 DIAGNOSIS — E349 Endocrine disorder, unspecified: Secondary | ICD-10-CM | POA: Insufficient documentation

## 2016-04-19 DIAGNOSIS — E559 Vitamin D deficiency, unspecified: Secondary | ICD-10-CM

## 2016-04-19 DIAGNOSIS — E1165 Type 2 diabetes mellitus with hyperglycemia: Secondary | ICD-10-CM | POA: Insufficient documentation

## 2016-04-19 DIAGNOSIS — E785 Hyperlipidemia, unspecified: Secondary | ICD-10-CM | POA: Insufficient documentation

## 2016-04-19 HISTORY — DX: Endocrine disorder, unspecified: E34.9

## 2016-04-19 HISTORY — DX: Vitamin D deficiency, unspecified: E55.9

## 2016-04-19 LAB — VITAMIN D 25 HYDROXY (VIT D DEFICIENCY, FRACTURES): Vit D, 25-Hydroxy: 6 ng/mL — ABNORMAL LOW (ref 30–100)

## 2016-04-19 LAB — TESTOSTERONE: TESTOSTERONE: 135 ng/dL — AB (ref 250–827)

## 2016-04-19 MED ORDER — VITAMIN D (ERGOCALCIFEROL) 1.25 MG (50000 UNIT) PO CAPS: 50000.0000 [IU] | ORAL_CAPSULE | ORAL | Status: DC

## 2016-04-19 MED ORDER — ATORVASTATIN CALCIUM 80 MG PO TABS: 80.0000 mg | ORAL_TABLET | Freq: Every day | ORAL | Status: DC

## 2016-04-19 NOTE — Progress Notes (Signed)
Quick Note:  1) The cholesterol is really really bad. I think there may be a genetic cause. This is life threatening if left untreated. I prescribed Lipitor. Start taking this daily and return in one month. 2) vitamin D is also very low. I prescribed vitamin D tablets. 3) testosterone is very low as well. We will recheck this in 2 weeks in the morning along with some other labs and likely start you on testosterone or runs. This will definitely help with weight loss. 4) you have prediabetes. Weight loss and diet will definitely help. 5) thyroid hormone levels look normal ______

## 2016-04-19 NOTE — Addendum Note (Signed)
Addended by: Rodolph BongOREY, EVAN S on: 04/19/2016 07:58 AM   Modules accepted: Orders, SmartSet

## 2016-04-25 ENCOUNTER — Emergency Department (HOSPITAL_BASED_OUTPATIENT_CLINIC_OR_DEPARTMENT_OTHER)
Admission: EM | Admit: 2016-04-25 | Discharge: 2016-04-25 | Disposition: A | Discharge status: Home / Self Care | Payer: 59 | Attending: Emergency Medicine | Admitting: Emergency Medicine

## 2016-04-25 ENCOUNTER — Encounter (HOSPITAL_BASED_OUTPATIENT_CLINIC_OR_DEPARTMENT_OTHER): Payer: Self-pay

## 2016-04-25 DIAGNOSIS — R000 Tachycardia, unspecified: Secondary | ICD-10-CM | POA: Insufficient documentation

## 2016-04-25 DIAGNOSIS — R252 Cramp and spasm: Secondary | ICD-10-CM | POA: Diagnosis not present

## 2016-04-25 DIAGNOSIS — I10 Essential (primary) hypertension: Secondary | ICD-10-CM | POA: Diagnosis not present

## 2016-04-25 DIAGNOSIS — M79642 Pain in left hand: Secondary | ICD-10-CM | POA: Diagnosis present

## 2016-04-25 HISTORY — DX: Essential (primary) hypertension: I10

## 2016-04-25 HISTORY — DX: Pure hypercholesterolemia, unspecified: E78.00

## 2016-04-25 LAB — CBC WITH DIFFERENTIAL/PLATELET
BASOS ABS: 0 10*3/uL (ref 0.0–0.1)
Basophils Relative: 0 %
EOS ABS: 0.1 10*3/uL (ref 0.0–0.7)
EOS PCT: 2 %
HEMATOCRIT: 45 % (ref 39.0–52.0)
Hemoglobin: 15.7 g/dL (ref 13.0–17.0)
Lymphocytes Relative: 23 %
Lymphs Abs: 1.7 10*3/uL (ref 0.7–4.0)
MCH: 33 pg (ref 26.0–34.0)
MCHC: 34.9 g/dL (ref 30.0–36.0)
MCV: 94.5 fL (ref 78.0–100.0)
MONO ABS: 0.7 10*3/uL (ref 0.1–1.0)
MONOS PCT: 9 %
NEUTROS ABS: 4.7 10*3/uL (ref 1.7–7.7)
Neutrophils Relative %: 66 %
Platelets: 298 10*3/uL (ref 150–400)
RBC: 4.76 MIL/uL (ref 4.22–5.81)
RDW: 13.4 % (ref 11.5–15.5)
WBC: 7.2 10*3/uL (ref 4.0–10.5)

## 2016-04-25 LAB — COMPREHENSIVE METABOLIC PANEL
ALBUMIN: 4.1 g/dL (ref 3.5–5.0)
ALK PHOS: 104 U/L (ref 38–126)
ALT: 63 U/L (ref 17–63)
AST: 61 U/L — ABNORMAL HIGH (ref 15–41)
Anion gap: 7 (ref 5–15)
BILIRUBIN TOTAL: 1.2 mg/dL (ref 0.3–1.2)
BUN: 16 mg/dL (ref 6–20)
CALCIUM: 8.9 mg/dL (ref 8.9–10.3)
CO2: 26 mmol/L (ref 22–32)
Chloride: 101 mmol/L (ref 101–111)
Creatinine, Ser: 1.01 mg/dL (ref 0.61–1.24)
GFR calc Af Amer: >60 mL/min (ref >60)
GFR calc non Af Amer: >60 mL/min (ref >60)
GLUCOSE: 127 mg/dL — AB (ref 65–99)
Potassium: 4 mmol/L (ref 3.5–5.1)
SODIUM: 134 mmol/L — AB (ref 135–145)
Total Protein: 8 g/dL (ref 6.5–8.1)

## 2016-04-25 LAB — MAGNESIUM: Magnesium: 2.1 mg/dL (ref 1.7–2.4)

## 2016-04-25 MED ORDER — SODIUM CHLORIDE 0.9 % IV BOLUS (SEPSIS)
500.0000 mL | Freq: Once | INTRAVENOUS | Status: AC
  Administered 2016-04-25: 500 mL via INTRAVENOUS

## 2016-04-25 NOTE — ED Provider Notes (Signed)
CSN: [id number]     Arrival date & time 04/25/16  1703 History   First MD Initiated Contact with Patient 04/25/16 1719     Chief Complaint  Patient presents with  . Hand Pain     Patient is a 40 y.o. male presenting with hand pain. The history is provided by the patient. No language interpreter was used.  Hand Pain   Rasheed Welty. is a 40 y.o. male who presents to the Emergency Department complaining of hand cramps.  He reports 1 day of intermittent cramping in bilateral hands, left greater than right. Episodes of cramping result in significant pain to the hand and for arm. Episodes last about 30 minutes at a time. He denies any chest pain or shortness of breath, fevers, abdominal pain, vomiting, diarrhea. He saw his PCP a week ago and was started on Lipitor, lisinopril, HCTZ. He works as a Investment banker, operational and sweats significantly at work as well.   Past Medical History  Diagnosis Date  . Hypertension   . High cholesterol    Past Surgical History  Procedure Laterality Date  . Knee arthroscopy w/ acl reconstruction     Family History  Problem Relation Age of Onset  . Heart disease Father   . Diabetes Paternal Uncle    Social History  Substance Use Topics  . Smoking status: Never Smoker   . Smokeless tobacco: Current User    Types: Chew  . Alcohol Use: 0.0 oz/week    0 Standard drinks or equivalent per week     Comment: weekly    Review of Systems  All other systems reviewed and are negative.     Allergies  Review of patient's allergies indicates no known allergies.  Home Medications   Prior to Admission medications   Medication Sig Start Date End Date Taking? Authorizing Provider  lisinopril-hydrochlorothiazide (PRINZIDE,ZESTORETIC) 10-12.5 MG tablet Take 1 tablet by mouth daily. 04/17/16   Rodolph Bong, MD  Vitamin D, Ergocalciferol, (DRISDOL) 50000 units CAPS capsule Take 1 capsule (50,000 Units total) by mouth every 7 (seven) days. Take for 8 total doses(weeks) 04/19/16    Rodolph Bong, MD   BP 137/94 mmHg  Pulse 104  Temp(Src) 98.8 F (37.1 C) (Oral)  Resp 13  Ht  (1.778 m)  Wt 356 lb (161.481 kg)  BMI 51.08 kg/m2  SpO2 98% Physical Exam  Constitutional: He is oriented to person, place, and time. He appears well-developed and well-nourished.  obese  HENT:  Head: Normocephalic and atraumatic.  Cardiovascular: Regular rhythm.   No murmur heard. tachycardic  Pulmonary/Chest: Effort normal and breath sounds normal. No respiratory distress.  Abdominal: Soft. There is no tenderness. There is no rebound and no guarding.  Musculoskeletal: He exhibits no edema or tenderness.  2+ radial pulses bilaterally.  No swelling to hands, no masses or deformities.    Neurological: He is alert and oriented to person, place, and time.  Skin: Skin is warm and dry.  Psychiatric: He has a normal mood and affect. His behavior is normal.  Nursing note and vitals reviewed.   ED Course  Procedures (including critical care time) Labs Review Labs Reviewed  COMPREHENSIVE METABOLIC PANEL - Abnormal; Notable for the following:    Sodium 134 (*)    Glucose, Bld 127 (*)    AST 61 (*)    All other components within normal limits  CBC WITH DIFFERENTIAL/PLATELET  MAGNESIUM    Imaging Review No results found. I have personally reviewed and  evaluated these images and lab results as part of my medical decision-making.   EKG Interpretation   Date/Time:  Thursday Apr 25 2016 17:18:09 EDT Ventricular Rate:  116 PR Interval:  142 QRS Duration: 76 QT Interval:  302 QTC Calculation: 419 R Axis:   79 Text Interpretation:  Sinus tachycardia Borderline repolarization  abnormality Baseline wander in lead(s) II III aVF Confirmed by Lincoln Brighamees, Liz  [phone]([id number]) on 04/25/2016 5:47:44 PM      MDM   Final diagnoses:  Hand cramps  Sinus tachycardia (HCC)    Patient here for cramping in bilateral hands, cramping improved on recheck. He was recently started on Lipitor,  lisinopril, HCTZ. Electrolytes with no significant abnormalities. He is noted to be tachycardic but on review of prior PCP and ED visits he has been persistently tachycardic. He seems to be asymptomatic from the tachycardia standpoint. Recommend discontinuing the Lipitor with outpatient PCP follow-up. Presentation is not consistent with ACS or dissection.    Tilden FossaElizabeth Orlandria Kissner, MD 04/25/16 1931

## 2016-04-25 NOTE — ED Notes (Signed)
C/o bilat hand cramps started yesterday-today left hand is worse than right-NAD-steady gait

## 2016-04-25 NOTE — Discharge Instructions (Signed)
Stop taking your lipitor.  Follow up with your family doctor for recheck.  Your heart rate was elevated on today's visit and appears to be elevated on previous visits, this may need to be further evaluated by your family doctor or a Cardiologist.  Get rechecked immediately if you develop chest pain, difficulty breathing, dizziness, or new concerning symptoms.     Muscle Cramps and Spasms Muscle cramps and spasms occur when a muscle or muscles tighten and you have no control over this tightening (involuntary muscle contraction). They are a common problem and can develop in any muscle. The most common place is in the calf muscles of the leg. Both muscle cramps and muscle spasms are involuntary muscle contractions, but they also have differences:   Muscle cramps are sporadic and painful. They may last a few seconds to a quarter of an hour. Muscle cramps are often more forceful and last longer than muscle spasms.  Muscle spasms may or may not be painful. They may also last just a few seconds or much longer. CAUSES  It is uncommon for cramps or spasms to be due to a serious underlying problem. In many cases, the cause of cramps or spasms is unknown. Some common causes are:   Overexertion.   Overuse from repetitive motions (doing the same thing over and over).   Remaining in a certain position for a long period of time.   Improper preparation, form, or technique while performing a sport or activity.   Dehydration.   Injury.   Side effects of some medicines.   Abnormally low levels of the salts and ions in your blood (electrolytes), especially potassium and calcium. This could happen if you are taking water pills (diuretics) or you are pregnant.  Some underlying medical problems can make it more likely to develop cramps or spasms. These include, but are not limited to:   Diabetes.   Parkinson disease.   Hormone disorders, such as thyroid problems.   Alcohol abuse.   Diseases  specific to muscles, joints, and bones.   Blood vessel disease where not enough blood is getting to the muscles.  HOME CARE INSTRUCTIONS   Stay well hydrated. Drink enough water and fluids to keep your urine clear or pale yellow.  It may be helpful to massage, stretch, and relax the affected muscle.  For tight or tense muscles, use a warm towel, heating pad, or hot shower water directed to the affected area.  If you are sore or have pain after a cramp or spasm, applying ice to the affected area may relieve discomfort.  Put ice in a plastic bag.  Place a towel between your skin and the bag.  Leave the ice on for 15-20 minutes, 03-04 times a day.  Medicines used to treat a known cause of cramps or spasms may help reduce their frequency or severity. Only take over-the-counter or prescription medicines as directed by your caregiver. SEEK MEDICAL CARE IF:  Your cramps or spasms get more severe, more frequent, or do not improve over time.  MAKE SURE YOU:   Understand these instructions.  Will watch your condition.  Will get help right away if you are not doing well or get worse.   This information is not intended to replace advice given to you by your health care provider. Make sure you discuss any questions you have with your health care provider.   Document Released: 05/24/2002 Document Revised: 03/29/2013 Document Reviewed: 11/18/2012 Elsevier Interactive Patient Education [employer]2016 [employer]vier Inc.

## 2016-04-25 NOTE — ED Notes (Signed)
Pt on heart monitor 

## 2016-05-20 ENCOUNTER — Ambulatory Visit: Payer: 59 | Admitting: Family Medicine

## 2016-05-23 ENCOUNTER — Ambulatory Visit: Payer: 59 | Admitting: Family Medicine

## 2016-05-28 ENCOUNTER — Encounter: Payer: Self-pay | Admitting: Family Medicine

## 2016-05-28 ENCOUNTER — Other Ambulatory Visit: Payer: Self-pay | Admitting: Family Medicine

## 2016-05-28 ENCOUNTER — Ambulatory Visit (INDEPENDENT_AMBULATORY_CARE_PROVIDER_SITE_OTHER): Payer: 59 | Admitting: Family Medicine

## 2016-05-28 VITALS — BP 128/89 | HR 99 | Wt 356.0 lb

## 2016-05-28 DIAGNOSIS — I10 Essential (primary) hypertension: Secondary | ICD-10-CM | POA: Diagnosis not present

## 2016-05-28 DIAGNOSIS — E559 Vitamin D deficiency, unspecified: Secondary | ICD-10-CM

## 2016-05-28 DIAGNOSIS — R252 Cramp and spasm: Secondary | ICD-10-CM | POA: Diagnosis not present

## 2016-05-28 DIAGNOSIS — E291 Testicular hypofunction: Secondary | ICD-10-CM

## 2016-05-28 DIAGNOSIS — E785 Hyperlipidemia, unspecified: Secondary | ICD-10-CM

## 2016-05-28 DIAGNOSIS — E349 Endocrine disorder, unspecified: Secondary | ICD-10-CM

## 2016-05-28 MED ORDER — LISINOPRIL-HYDROCHLOROTHIAZIDE 10-12.5 MG PO TABS: 1.0000 | ORAL_TABLET | Freq: Every day | ORAL | Status: DC

## 2016-05-28 MED ORDER — ATORVASTATIN CALCIUM 80 MG PO TABS: 80.0000 mg | ORAL_TABLET | Freq: Every day | ORAL | Status: DC

## 2016-05-28 NOTE — Progress Notes (Signed)
       Dylan CullensWendell Cong Jr. is a 40 y.o. male who [facility] today for follow-up hypertension and hyperlipidemia and testosterone deficiency.  Patient was seen last month and started on lisinopril hydrochlorothiazide for hypertension. Additionally he was found to have significantly abnormal cholesterol started on Lipitor 80. Since then he developed significant cramping involving his left hand was seen in the emergency department. Lab workup was essentially normal. He is advised to discontinue the Lipitor. Since then the cramping has continued. He notes spasm in his forearm causing contracture of his fingers. This is quite painful and occurs intermittently. He denies any fevers or chills or abdominal pain.   Past Medical History  Diagnosis Date  . Hypertension   . High cholesterol    Past Surgical History  Procedure Laterality Date  . Knee arthroscopy w/ acl reconstruction     Social History  Substance Use Topics  . Smoking status: Never Smoker   . Smokeless tobacco: Current User    Types: Chew  . Alcohol Use: 0.0 oz/week    0 Standard drinks or equivalent per week     Comment: weekly   family history includes Diabetes in his paternal uncle; Heart disease in his father.  ROS as above:  Medications: Current Outpatient Prescriptions  Medication Sig Dispense Refill  . lisinopril-hydrochlorothiazide (PRINZIDE,ZESTORETIC) 10-12.5 MG tablet Take 1 tablet by mouth daily. 90 tablet 0  . Vitamin D, Ergocalciferol, (DRISDOL) 50000 units CAPS capsule Take 1 capsule (50,000 Units total) by mouth every 7 (seven) days. Take for 8 total doses(weeks) 8 capsule 0  . atorvastatin (LIPITOR) 80 MG tablet Take 1 tablet (80 mg total) by mouth daily at 6 PM. 90 tablet 1   No current facility-administered medications for this visit.   No Known Allergies   Exam:  BP 128/89  mmHg  Pulse 99  Wt 356 lb (161.481 kg) Gen: Well NAD Morbidly obese HEENT: EOMI,  MMM Lungs: Normal work of breathing. CTABL Heart: RRR no MRG Abd: NABS, Soft. Nondistended, Nontender Exts: Brisk capillary refill, warm and well perfused.   No results found for this or any previous visit (from the past 24 hour(s)). No results found.    Assessment and Plan: 40 y.o. male with  1) hypertension: Well-controlled. Continue current regimen.  2) hyperlipidemia: Severe likely genetic. Restart Lipitor.  3) cramping: Unclear etiology. Recheck metabolic panel and check calcium and parathyroid hormone.  4) testosterone deficiency: Recheck testosterone and check FSH and LH    Discussed warning signs or symptoms. Please see discharge instructions. Patient expresses understanding.

## 2016-05-28 NOTE — Patient Instructions (Signed)
Thank you for coming in today. Continue medicines.  Get labs in the morning.  Return in 2-4 weeks.   Muscle Cramps and Spasms Muscle cramps and spasms occur when a muscle or muscles tighten and you have no control over this tightening (involuntary muscle contraction). They are a common problem and can develop in any muscle. The most common place is in the calf muscles of the leg. Both muscle cramps and muscle spasms are involuntary muscle contractions, but they also have differences:   Muscle cramps are sporadic and painful. They may last a few seconds to a quarter of an hour. Muscle cramps are often more forceful and last longer than muscle spasms.  Muscle spasms may or may not be painful. They may also last just a few seconds or much longer. CAUSES  It is uncommon for cramps or spasms to be due to a serious underlying problem. In many cases, the cause of cramps or spasms is unknown. Some common causes are:   Overexertion.   Overuse from repetitive motions (doing the same thing over and over).   Remaining in a certain position for a long period of time.   Improper preparation, form, or technique while performing a sport or activity.   Dehydration.   Injury.   Side effects of some medicines.   Abnormally low levels of the salts and ions in your blood (electrolytes), especially potassium and calcium. This could happen if you are taking water pills (diuretics) or you are pregnant.  Some underlying medical problems can make it more likely to develop cramps or spasms. These include, but are not limited to:   Diabetes.   Parkinson disease.   Hormone disorders, such as thyroid problems.   Alcohol abuse.   Diseases specific to muscles, joints, and bones.   Blood vessel disease where not enough blood is getting to the muscles.  HOME CARE INSTRUCTIONS   Stay well hydrated. Drink enough water and fluids to keep your urine clear or pale yellow.  It may be helpful to  massage, stretch, and relax the affected muscle.  For tight or tense muscles, use a warm towel, heating pad, or hot shower water directed to the affected area.  If you are sore or have pain after a cramp or spasm, applying ice to the affected area may relieve discomfort.  Put ice in a plastic bag.  Place a towel between your skin and the bag.  Leave the ice on for 15-20 minutes, 03-04 times a day.  Medicines used to treat a known cause of cramps or spasms may help reduce their frequency or severity. Only take over-the-counter or prescription medicines as directed by your caregiver. SEEK MEDICAL CARE IF:  Your cramps or spasms get more severe, more frequent, or do not improve over time.  MAKE SURE YOU:   Understand these instructions.  Will watch your condition.  Will get help right away if you are not doing well or get worse.   This information is not intended to replace advice given to you by your health care provider. Make sure you discuss any questions you have with your health care provider.   Document Released: 05/24/2002 Document Revised: 03/29/2013 Document Reviewed: 11/18/2012 Elsevier Interactive Patient Education [employer] Elsevier Inc.

## 2016-06-15 ENCOUNTER — Other Ambulatory Visit: Payer: Self-pay | Admitting: Family Medicine

## 2016-06-25 ENCOUNTER — Ambulatory Visit (INDEPENDENT_AMBULATORY_CARE_PROVIDER_SITE_OTHER): Payer: 59 | Admitting: Family Medicine

## 2016-06-25 DIAGNOSIS — Z5329 Procedure and treatment not carried out because of patient's decision for other reasons: Secondary | ICD-10-CM

## 2016-06-26 NOTE — Progress Notes (Signed)
No show f/u soon.

## 2016-10-25 ENCOUNTER — Emergency Department (HOSPITAL_BASED_OUTPATIENT_CLINIC_OR_DEPARTMENT_OTHER)
Admission: EM | Admit: 2016-10-25 | Discharge: 2016-10-26 | Disposition: A | Discharge status: Home / Self Care | Payer: Self-pay | Attending: Emergency Medicine | Admitting: Emergency Medicine

## 2016-10-25 ENCOUNTER — Emergency Department (HOSPITAL_BASED_OUTPATIENT_CLINIC_OR_DEPARTMENT_OTHER): Payer: Self-pay

## 2016-10-25 ENCOUNTER — Encounter (HOSPITAL_BASED_OUTPATIENT_CLINIC_OR_DEPARTMENT_OTHER): Payer: Self-pay | Admitting: *Deleted

## 2016-10-25 DIAGNOSIS — R1033 Periumbilical pain: Secondary | ICD-10-CM | POA: Insufficient documentation

## 2016-10-25 DIAGNOSIS — R55 Syncope and collapse: Secondary | ICD-10-CM | POA: Insufficient documentation

## 2016-10-25 DIAGNOSIS — F17220 Nicotine dependence, chewing tobacco, uncomplicated: Secondary | ICD-10-CM | POA: Insufficient documentation

## 2016-10-25 DIAGNOSIS — R61 Generalized hyperhidrosis: Secondary | ICD-10-CM | POA: Insufficient documentation

## 2016-10-25 DIAGNOSIS — I10 Essential (primary) hypertension: Secondary | ICD-10-CM | POA: Insufficient documentation

## 2016-10-25 LAB — CBC WITH DIFFERENTIAL/PLATELET
BASOS PCT: 0 %
Basophils Absolute: 0 10*3/uL (ref 0.0–0.1)
Eosinophils Absolute: 0.1 10*3/uL (ref 0.0–0.7)
Eosinophils Relative: 1 %
HEMATOCRIT: 44.8 % (ref 39.0–52.0)
HEMOGLOBIN: 15.9 g/dL (ref 13.0–17.0)
LYMPHS PCT: 22 %
Lymphs Abs: 2.3 10*3/uL (ref 0.7–4.0)
MCH: 33.2 pg (ref 26.0–34.0)
MCHC: 35.5 g/dL (ref 30.0–36.0)
MCV: 93.5 fL (ref 78.0–100.0)
MONO ABS: 0.9 10*3/uL (ref 0.1–1.0)
MONOS PCT: 9 %
NEUTROS ABS: 7.3 10*3/uL (ref 1.7–7.7)
NEUTROS PCT: 68 %
Platelets: 372 10*3/uL (ref 150–400)
RBC: 4.79 MIL/uL (ref 4.22–5.81)
RDW: 12.4 % (ref 11.5–15.5)
WBC: 10.7 10*3/uL — ABNORMAL HIGH (ref 4.0–10.5)

## 2016-10-25 LAB — COMPREHENSIVE METABOLIC PANEL
ALBUMIN: 4.3 g/dL (ref 3.5–5.0)
ALK PHOS: 94 U/L (ref 38–126)
ALT: 44 U/L (ref 17–63)
ANION GAP: 12 (ref 5–15)
AST: 42 U/L — AB (ref 15–41)
BILIRUBIN TOTAL: 1.1 mg/dL (ref 0.3–1.2)
BUN: 9 mg/dL (ref 6–20)
CALCIUM: 9.7 mg/dL (ref 8.9–10.3)
CO2: 26 mmol/L (ref 22–32)
Chloride: 98 mmol/L — ABNORMAL LOW (ref 101–111)
Creatinine, Ser: 1.05 mg/dL (ref 0.61–1.24)
GFR calc Af Amer: >60 mL/min (ref >60)
GLUCOSE: 148 mg/dL — AB (ref 65–99)
POTASSIUM: 3.9 mmol/L (ref 3.5–5.1)
Sodium: 136 mmol/L (ref 135–145)
TOTAL PROTEIN: 8.2 g/dL — AB (ref 6.5–8.1)

## 2016-10-25 LAB — TROPONIN I

## 2016-10-25 LAB — LIPASE, BLOOD: Lipase: 18 U/L (ref 11–51)

## 2016-10-25 LAB — CBG MONITORING, ED: Glucose-Capillary: 141 mg/dL — ABNORMAL HIGH (ref 65–99)

## 2016-10-25 LAB — I-STAT CG4 LACTIC ACID, ED: Lactic Acid, Venous: 2.62 mmol/L (ref 0.5–1.9)

## 2016-10-25 MED ORDER — FENTANYL CITRATE (PF) 100 MCG/2ML IJ SOLN
50.0000 ug | Freq: Once | INTRAMUSCULAR | Status: AC
  Administered 2016-10-25: 50 ug via INTRAVENOUS
  Filled 2016-10-25: qty 2

## 2016-10-25 MED ORDER — ONDANSETRON HCL 4 MG/2ML IJ SOLN
4.0000 mg | Freq: Once | INTRAMUSCULAR | Status: AC
  Administered 2016-10-25: 4 mg via INTRAVENOUS
  Filled 2016-10-25: qty 2

## 2016-10-25 MED ORDER — SODIUM CHLORIDE 0.9 % IV BOLUS (SEPSIS)
500.0000 mL | Freq: Once | INTRAVENOUS | Status: AC
  Administered 2016-10-25: 500 mL via INTRAVENOUS

## 2016-10-25 NOTE — ED Triage Notes (Addendum)
Pt c/o diffuse abd pain x 6 hrs , denies n/v.  Pt is diaphoretic reports lightheaded.

## 2016-10-25 NOTE — ED Provider Notes (Signed)
[facility] Note   CSN: [id number] Arrival date & time: 10/25/16  2222     History   Chief Complaint Chief Complaint  Patient presents with  . Abdominal Pain    HPI Dylan CullensWendell Klonowski Jr. is a 40 y.o. male.  HPI Dylan CullensWendell Luscher Jr. is a 40 y.o. male history of hypertension and high cholesterol, presents to emergency department complaining of severe abdominal pain. Patient has been off of his medications for approximately 2 months because he lost his insurance. He states pain started suddenly. He reports associated nausea, denies vomiting. Denies diarrhea. Normal bowel movement this morning. Denies urinary symptoms. Has not taken any medications for this. Patient drove himself here. Upon arriving to the emergency department, patient became very diaphoretic and slow to respond, he was rushed to the exam room.  Past Medical History:  Diagnosis Date  . High cholesterol   . Hypertension     Patient Active Problem List   Diagnosis Date Noted  . Cramping of hands 05/28/2016  . Hyperlipidemia 04/19/2016  . Prediabetes 04/19/2016  . Testosterone deficiency 04/19/2016  . Vitamin D deficiency 04/19/2016  . HTN (hypertension) 04/17/2016  . Morbid obesity (HCC) 04/17/2016  . Sleep apnea 04/17/2016  . SOB (shortness of breath) on exertion 04/17/2016  . Left knee pain 04/17/2016  . Paresthesia and pain of both upper extremities 04/17/2016    Past Surgical History:  Procedure Laterality Date  . KNEE ARTHROSCOPY W/ ACL RECONSTRUCTION         Home Medications    Prior to Admission medications   Not on File    Family History Family History  Problem Relation Age of Onset  . Heart disease Father   . Diabetes Paternal Uncle     Social History Social History  Substance Use Topics  . Smoking status: Never Smoker  . Smokeless tobacco: Current User    Types: Chew  . Alcohol use 0.0 oz/week     Comment: weekly     Allergies   Patient has no known  allergies.   Review of Systems Review of Systems  Constitutional: Positive for diaphoresis. Negative for chills and fever.  Respiratory: Negative for cough, chest tightness and shortness of breath.   Cardiovascular: Negative for chest pain, palpitations and leg swelling.  Gastrointestinal: Positive for abdominal pain. Negative for abdominal distention, diarrhea, nausea and vomiting.  Genitourinary: Negative for dysuria, frequency, hematuria and urgency.  Musculoskeletal: Negative for arthralgias, myalgias, neck pain and neck stiffness.  Skin: Negative for rash.  Allergic/Immunologic: Negative for immunocompromised state.  Neurological: Negative for dizziness, weakness, light-headedness, numbness and headaches.  All other systems reviewed and are negative.    Physical Exam Updated Vital Signs BP (!) 180/116   Pulse 100   Temp 98.8 F (37.1 C)   Resp 18   Ht 6' (1.829 m)   Wt (!) 156.5 kg   SpO2 98%   BMI 46.79 kg/m   Physical Exam  Constitutional: He appears well-developed and well-nourished. No distress.  HENT:  Head: Normocephalic and atraumatic.  Eyes: Conjunctivae are normal.  Neck: Neck supple.  Cardiovascular: Normal rate, regular rhythm and normal heart sounds.   Pulmonary/Chest: Effort normal. No respiratory distress. He has no wheezes. He has no rales.  Abdominal: Soft. Bowel sounds are normal. He exhibits no distension. There is tenderness. There is no rebound.  Mild periumbilical tenderness  Musculoskeletal: He exhibits no edema.  Neurological: He is alert.  Skin: Skin is warm and dry.  Nursing note  and vitals reviewed.    ED Treatments / Results  Labs (all labs ordered are listed, but only abnormal results are displayed) Labs Reviewed  CBC WITH DIFFERENTIAL/PLATELET - Abnormal; Notable for the following:       Result Value   WBC 10.7 (*)    All other components within normal limits  I-STAT CG4 LACTIC ACID, ED - Abnormal; Notable for the following:      Lactic Acid, Venous 2.62 (*)    All other components within normal limits  CBG MONITORING, ED - Abnormal; Notable for the following:    Glucose-Capillary 141 (*)    All other components within normal limits  COMPREHENSIVE METABOLIC PANEL  TROPONIN I  LIPASE, BLOOD  URINALYSIS, ROUTINE W REFLEX MICROSCOPIC (NOT AT [facility])    EKG  EKG Interpretation  Date/Time:  Friday October 25 2016 22:35:38 EST Ventricular Rate:  99 PR Interval:    QRS Duration: 83 QT Interval:  365 QTC Calculation: 469 R Axis:   54 Text Interpretation:  Sinus rhythm ST/T changes from May 2017 have improved Confirmed by Criss Alvine MD, SCOTT [phone]) on 10/25/2016 10:58:05 PM       Radiology No results found.  Procedures Procedures (including critical care time)  Medications Ordered in ED Medications  sodium chloride 0.9 % bolus 500 mL (not administered)  ondansetron (ZOFRAN) injection 4 mg (4 mg Intravenous Given 10/25/16 2254)  fentaNYL (SUBLIMAZE) injection 50 mcg (50 mcg Intravenous Given 10/25/16 2255)  sodium chloride 0.9 % bolus 500 mL (500 mLs Intravenous New Bag/Given 10/25/16 2256)     Initial Impression / Assessment and Plan / ED Course  I have reviewed the triage vital signs and the nursing notes.  Pertinent labs & imaging results that were available during my care of the patient were reviewed by me and considered in my medical decision making (see chart for details).  Clinical Course    Patient apparently very diaphoretic, slow to respond upon triage. patient stated that he is in severe abdominal pain, denies any chest pain, back pain, dizziness, confusion. Pain onset 5 PM this evening. No significant tenderness on his abdominal exam. Patient is diaphoretic. He is hypertensive and mildly tachycardic with heart rate around 100. He is afebrile. Will check labs, chest x-ray, EKG, troponin, CT abdomen ordered.  Patient improved within 5 minutes, he is no longer diaphoretic, he is appropriate,  reports 10 out of 10 abdominal pain. No significant tenderness, no pulsatile masses, no pain radiating to the back or chest reported. Continue to monitor, labs and CT abdomen and pelvis pending. EKG normal.  1:15 AM CT scan with no acute abnormalities. Urinalysis normal. A repeat lactic acid is 1.64. Patient has not had anymore pain since being in emergency department. His abdomen is benign. Question bowel spasms, will treat with Bentyl. EKG repeated, unchanged.   Vitals:   10/26/16 0000 10/26/16 0045 10/26/16 0100 10/26/16 0114  BP: 126/91 129/81 116/86 119/88  Pulse: 92 94 99 92  Resp: 16 16 20 14   Temp:      SpO2: 100% 100% 93% 96%  Weight:      Height:         Final Clinical Impressions(s) / ED Diagnoses   Final diagnoses:  Periumbilical abdominal pain  Vasovagal reaction    New Prescriptions New Prescriptions   DICYCLOMINE (BENTYL) 20 MG TABLET    Take 1 tablet (20 mg total) by mouth 2 (two) times daily.     Jaynie Crumble, PA-C 10/26/16 1933  Pricilla LovelessScott Goldston, MD 10/26/16 2330

## 2016-10-26 LAB — URINALYSIS, ROUTINE W REFLEX MICROSCOPIC
BILIRUBIN URINE: NEGATIVE
Glucose, UA: NEGATIVE mg/dL
HGB URINE DIPSTICK: NEGATIVE
Ketones, ur: NEGATIVE mg/dL
Leukocytes, UA: NEGATIVE
Nitrite: NEGATIVE
Protein, ur: NEGATIVE mg/dL
SPECIFIC GRAVITY, URINE: 1.016 (ref 1.005–1.030)
pH: 6 (ref 5.0–8.0)

## 2016-10-26 LAB — I-STAT CG4 LACTIC ACID, ED: LACTIC ACID, VENOUS: 1.64 mmol/L (ref 0.5–1.9)

## 2016-10-26 MED ORDER — IOPAMIDOL (ISOVUE-300) INJECTION 61%
100.0000 mL | Freq: Once | INTRAVENOUS | Status: AC | PRN
  Administered 2016-10-26: 100 mL via INTRAVENOUS

## 2016-10-26 MED ORDER — DICYCLOMINE HCL 10 MG PO CAPS
10.0000 mg | ORAL_CAPSULE | Freq: Once | ORAL | Status: AC
  Administered 2016-10-26: 10 mg via ORAL
  Filled 2016-10-26: qty 1

## 2016-10-26 MED ORDER — DICYCLOMINE HCL 20 MG PO TABS: 20.0000 mg | ORAL_TABLET | Freq: Two times a day (BID) | ORAL | 0 refills | Status: DC

## 2016-10-26 MED ORDER — SODIUM CHLORIDE 0.9 % IV BOLUS (SEPSIS)
500.0000 mL | Freq: Once | INTRAVENOUS | Status: AC
  Administered 2016-10-26: 500 mL via INTRAVENOUS

## 2016-10-26 NOTE — Discharge Instructions (Signed)
Your blood work and CT scan did not show any abnormalities. Take bentyl as prescribed for possible bowel spasms. Drink plenty of fluids. Take tylenol for pain. Follow up with a family doctor in 2 days. Return if worsening symptoms.

## 2016-10-26 NOTE — ED Notes (Signed)
Patient transported to CT 

## 2017-03-21 ENCOUNTER — Encounter: Payer: 59 | Admitting: Family Medicine

## 2017-04-03 ENCOUNTER — Ambulatory Visit (INDEPENDENT_AMBULATORY_CARE_PROVIDER_SITE_OTHER): Payer: BC Managed Care – PPO | Admitting: Family Medicine

## 2017-04-03 ENCOUNTER — Encounter: Payer: Self-pay | Admitting: Family Medicine

## 2017-04-03 VITALS — BP 181/100 | HR 100 | Wt 362.0 lb

## 2017-04-03 DIAGNOSIS — G4730 Sleep apnea, unspecified: Secondary | ICD-10-CM | POA: Diagnosis not present

## 2017-04-03 DIAGNOSIS — E559 Vitamin D deficiency, unspecified: Secondary | ICD-10-CM | POA: Diagnosis not present

## 2017-04-03 DIAGNOSIS — E349 Endocrine disorder, unspecified: Secondary | ICD-10-CM | POA: Diagnosis not present

## 2017-04-03 DIAGNOSIS — I10 Essential (primary) hypertension: Secondary | ICD-10-CM

## 2017-04-03 DIAGNOSIS — R7303 Prediabetes: Secondary | ICD-10-CM

## 2017-04-03 LAB — COMPLETE METABOLIC PANEL WITH GFR
ALBUMIN: 4.1 g/dL (ref 3.6–5.1)
ALK PHOS: 100 U/L (ref 40–115)
ALT: 57 U/L — ABNORMAL HIGH (ref 9–46)
AST: 55 U/L — ABNORMAL HIGH (ref 10–40)
BUN: 9 mg/dL (ref 7–25)
CALCIUM: 9.2 mg/dL (ref 8.6–10.3)
CHLORIDE: 107 mmol/L (ref 98–110)
CO2: 21 mmol/L (ref 20–31)
Creat: 1.01 mg/dL (ref 0.60–1.35)
Glucose, Bld: 104 mg/dL — ABNORMAL HIGH (ref 65–99)
POTASSIUM: 4.1 mmol/L (ref 3.5–5.3)
Sodium: 143 mmol/L (ref 135–146)
Total Bilirubin: 0.6 mg/dL (ref 0.2–1.2)
Total Protein: 7 g/dL (ref 6.1–8.1)

## 2017-04-03 LAB — LIPID PANEL W/REFLEX DIRECT LDL
CHOL/HDL RATIO: 12.4 ratio — AB (ref <5.0)
CHOLESTEROL: 470 mg/dL — AB (ref <200)
HDL: 38 mg/dL — AB (ref >40)
Non-HDL Cholesterol (Calc): 432 mg/dL — ABNORMAL HIGH (ref <130)
TRIGLYCERIDES: 176 mg/dL — AB (ref <150)

## 2017-04-03 LAB — CBC
HEMATOCRIT: 43.5 % (ref 38.5–50.0)
HEMOGLOBIN: 14.8 g/dL (ref 13.2–17.1)
MCH: 32.5 pg (ref 27.0–33.0)
MCHC: 34 g/dL (ref 32.0–36.0)
MCV: 95.6 fL (ref 80.0–100.0)
MPV: 9.7 fL (ref 7.5–12.5)
Platelets: 302 10*3/uL (ref 140–400)
RBC: 4.55 MIL/uL (ref 4.20–5.80)
RDW: 15 % (ref 11.0–15.0)
WBC: 5.5 10*3/uL (ref 3.8–10.8)

## 2017-04-03 MED ORDER — LISINOPRIL-HYDROCHLOROTHIAZIDE 10-12.5 MG PO TABS: 1.0000 | ORAL_TABLET | Freq: Every day | ORAL | 1 refills | Status: DC

## 2017-04-03 MED ORDER — ATORVASTATIN CALCIUM 80 MG PO TABS: 80.0000 mg | ORAL_TABLET | Freq: Every day | ORAL | 1 refills | Status: DC

## 2017-04-03 NOTE — Progress Notes (Signed)
       Dylan Munoz. is a 41 y.o. male who presents to [name] today for follow-up hypertension hyperlipidemia and low testosterone.  Patient was seen last year for the above problems. He lost his health insurance and was lost to follow-up. He is not taking any medications now denies chest pain palpitations or shortness of breath. He doesn't exercise or follow a close diet. He notes decreased libido with low testosterone.   Past Medical History:  Diagnosis Date  . High cholesterol   . Hypertension    Past Surgical History:  Procedure Laterality Date  . KNEE ARTHROSCOPY W/ ACL RECONSTRUCTION     Social History  Substance Use Topics  . Smoking status: Never Smoker  . Smokeless tobacco: Current User    Types: Chew  . Alcohol use 0.0 oz/week     Comment: weekly   family history includes Diabetes in his paternal uncle; Heart disease in his father.  ROS as above:  Medications: Current Outpatient Prescriptions  Medication Sig Dispense Refill  . atorvastatin (LIPITOR) 80 MG tablet Take 1 tablet (80 mg total) by mouth daily. 30 tablet 1  . lisinopril-hydrochlorothiazide (PRINZIDE,ZESTORETIC) 10-12.5 MG tablet Take 1 tablet by mouth daily. 30 tablet 1   No current facility-administered medications for this visit.    No Known Allergies  Health Maintenance Health Maintenance  Topic Date Due  . HIV Screening  04/03/1991  . INFLUENZA VACCINE  07/16/2017  . TETANUS/TDAP  04/17/2026     Exam:  BP (!) 181/100   Pulse 100   Wt (!) 362 lb (164.2 kg)   BMI 49.10 kg/m  Gen: Well NAD Morbidly obese HEENT: EOMI,  MMM Lungs: Normal work of breathing. CTABL Heart: RRR no MRG Abd: NABS, Soft. Nondistended, Nontender Exts: Brisk capillary refill, warm and well perfused.    No results found for this or any previous visit (from the past 72 hour(s)). No results  found.    Assessment and Plan: 41 y.o. male with  Hypertension not well-controlled. Start lisinopril/hydrochlorothiazide. Check fasting labs. Recheck in one month.  Hyperlipidemia: Previously class for a list significantly elevated. This is likely genetic. Restart atorvastatin check fasting labs. Recheck in one month.  Low testosterone with decreased libido. Check testosterone today.    pre-diabetes: A1c pending.  Morbid obesity central medical problem. We'll address this issue along with sleep apnea in the near future.   Orders Placed This Encounter  Procedures  . CBC  . COMPLETE METABOLIC PANEL WITH GFR  . Lipid Panel w/reflex Direct LDL  . Hemoglobin A1c  . Testosterone   Meds ordered this encounter  Medications  . lisinopril-hydrochlorothiazide (PRINZIDE,ZESTORETIC) 10-12.5 MG tablet    Sig: Take 1 tablet by mouth daily.    Dispense:  30 tablet    Refill:  1  . atorvastatin (LIPITOR) 80 MG tablet    Sig: Take 1 tablet (80 mg total) by mouth daily.    Dispense:  30 tablet    Refill:  1     Discussed warning signs or symptoms. Please see discharge instructions. Patient expresses understanding.

## 2017-04-03 NOTE — Patient Instructions (Signed)
Thank you for coming in today. Restart the blood pressure and cholesterol medicine.  Recheck in 1 month.  Start taking over the counter Vitamin D pills.  Get the 5000 unit pills and take daily.  Call or go to the emergency room if you get worse, have trouble breathing, have chest pains, or palpitations.     Hypertension Hypertension, commonly called high blood pressure, is when the force of blood pumping through the arteries is too strong. The arteries are the blood vessels that carry blood from the heart throughout the body. Hypertension forces the heart to work harder to pump blood and may cause arteries to become narrow or stiff. Having untreated or uncontrolled hypertension can cause heart attacks, strokes, kidney disease, and other problems. A blood pressure reading consists of a higher number over a lower number. Ideally, your blood pressure should be below 120/80. The first ("top") number is called the systolic pressure. It is a measure of the pressure in your arteries as your heart beats. The second ("bottom") number is called the diastolic pressure. It is a measure of the pressure in your arteries as the heart relaxes. What are the causes? The cause of this condition is not known. What increases the risk? Some risk factors for high blood pressure are under your control. Others are not. Factors you can change   Smoking.  Having type 2 diabetes mellitus, high cholesterol, or both.  Not getting enough exercise or physical activity.  Being overweight.  Having too much fat, sugar, calories, or salt (sodium) in your diet.  Drinking too much alcohol. Factors that are difficult or impossible to change   Having chronic kidney disease.  Having a family history of high blood pressure.  Age. Risk increases with age.  Race. You may be at higher risk if you are African-American.  Gender. Men are at higher risk than women before age 73. After age 55, women are at higher risk than  men.  Having obstructive sleep apnea.  Stress. What are the signs or symptoms? Extremely high blood pressure (hypertensive crisis) may cause:  Headache.  Anxiety.  Shortness of breath.  Nosebleed.  Nausea and vomiting.  Severe chest pain.  Jerky movements you cannot control (seizures). How is this diagnosed? This condition is diagnosed by measuring your blood pressure while you are seated, with your arm resting on a surface. The cuff of the blood pressure monitor will be placed directly against the skin of your upper arm at the level of your heart. It should be measured at least twice using the same arm. Certain conditions can cause a difference in blood pressure between your right and left arms. Certain factors can cause blood pressure readings to be lower or higher than normal (elevated) for a short period of time:  When your blood pressure is higher when you are in a health care provider's office than when you are at home, this is called white coat hypertension. Most people with this condition do not need medicines.  When your blood pressure is higher at home than when you are in a health care provider's office, this is called masked hypertension. Most people with this condition may need medicines to control blood pressure. If you have a high blood pressure reading during one visit or you have normal blood pressure with other risk factors:  You may be asked to return on a different day to have your blood pressure checked again.  You may be asked to monitor your blood pressure at  home for 1 week or longer. If you are diagnosed with hypertension, you may have other blood or imaging tests to help your health care provider understand your overall risk for other conditions. How is this treated? This condition is treated by making healthy lifestyle changes, such as eating healthy foods, exercising more, and reducing your alcohol intake. Your health care provider may prescribe medicine  if lifestyle changes are not enough to get your blood pressure under control, and if:  Your systolic blood pressure is above 130.  Your diastolic blood pressure is above 80. Your personal target blood pressure may vary depending on your medical conditions, your age, and other factors. Follow these instructions at home: Eating and drinking   Eat a diet that is high in fiber and potassium, and low in sodium, added sugar, and fat. An example eating plan is called the DASH (Dietary Approaches to Stop Hypertension) diet. To eat this way:  Eat plenty of fresh fruits and vegetables. Try to fill half of your plate at each meal with fruits and vegetables.  Eat whole grains, such as whole wheat pasta, brown rice, or whole grain bread. Fill about one quarter of your plate with whole grains.  Eat or drink low-fat dairy products, such as skim milk or low-fat yogurt.  Avoid fatty cuts of meat, processed or cured meats, and poultry with skin. Fill about one quarter of your plate with lean proteins, such as fish, chicken without skin, beans, eggs, and tofu.  Avoid premade and processed foods. These tend to be higher in sodium, added sugar, and fat.  Reduce your daily sodium intake. Most people with hypertension should eat less than 1,500 mg of sodium a day.  Limit alcohol intake to no more than 1 drink a day for nonpregnant women and 2 drinks a day for men. One drink equals 12 oz of beer, 5 oz of wine, or 1 oz of hard liquor. Lifestyle   Work with your health care provider to maintain a healthy body weight or to lose weight. Ask what an ideal weight is for you.  Get at least 30 minutes of exercise that causes your heart to beat faster (aerobic exercise) most days of the week. Activities may include walking, swimming, or biking.  Include exercise to strengthen your muscles (resistance exercise), such as pilates or lifting weights, as part of your weekly exercise routine. Try to do these types of  exercises for 30 minutes at least 3 days a week.  Do not use any products that contain nicotine or tobacco, such as cigarettes and e-cigarettes. If you need help quitting, ask your health care provider.  Monitor your blood pressure at home as told by your health care provider.  Keep all follow-up visits as told by your health care provider. This is important. Medicines   Take over-the-counter and prescription medicines only as told by your health care provider. Follow directions carefully. Blood pressure medicines must be taken as prescribed.  Do not skip doses of blood pressure medicine. Doing this puts you at risk for problems and can make the medicine less effective.  Ask your health care provider about side effects or reactions to medicines that you should watch for. Contact a health care provider if:  You think you are having a reaction to a medicine you are taking.  You have headaches that keep coming back (recurring).  You feel dizzy.  You have swelling in your ankles.  You have trouble with your vision. Get help right  away if:  You develop a severe headache or confusion.  You have unusual weakness or numbness.  You feel faint.  You have severe pain in your chest or abdomen.  You vomit repeatedly.  You have trouble breathing. Summary  Hypertension is when the force of blood pumping through your arteries is too strong. If this condition is not controlled, it may put you at risk for serious complications.  Your personal target blood pressure may vary depending on your medical conditions, your age, and other factors. For most people, a normal blood pressure is less than 120/80.  Hypertension is treated with lifestyle changes, medicines, or a combination of both. Lifestyle changes include weight loss, eating a healthy, low-sodium diet, exercising more, and limiting alcohol. This information is not intended to replace advice given to you by your health care provider.  Make sure you discuss any questions you have with your health care provider. Document Released: 12/02/2005 Document Revised: 10/30/2016 Document Reviewed: 10/30/2016 Elsevier Interactive Patient Education  2017 [employer].

## 2017-04-03 NOTE — Progress Notes (Signed)
1 

## 2017-04-04 ENCOUNTER — Encounter: Payer: Self-pay | Admitting: Family Medicine

## 2017-04-04 LAB — TESTOSTERONE: Testosterone: 118 ng/dL — ABNORMAL LOW (ref 250–827)

## 2017-04-04 LAB — HEMOGLOBIN A1C
Hgb A1c MFr Bld: 5.4 % (ref <5.7)
MEAN PLASMA GLUCOSE: 108 mg/dL

## 2017-05-05 ENCOUNTER — Encounter: Payer: Self-pay | Admitting: Family Medicine

## 2017-05-05 ENCOUNTER — Ambulatory Visit (INDEPENDENT_AMBULATORY_CARE_PROVIDER_SITE_OTHER): Payer: BC Managed Care – PPO | Admitting: Family Medicine

## 2017-05-05 VITALS — BP 125/79 | HR 100 | Wt 348.0 lb

## 2017-05-05 DIAGNOSIS — I10 Essential (primary) hypertension: Secondary | ICD-10-CM | POA: Diagnosis not present

## 2017-05-05 DIAGNOSIS — E349 Endocrine disorder, unspecified: Secondary | ICD-10-CM

## 2017-05-05 DIAGNOSIS — E559 Vitamin D deficiency, unspecified: Secondary | ICD-10-CM

## 2017-05-05 DIAGNOSIS — G4730 Sleep apnea, unspecified: Secondary | ICD-10-CM

## 2017-05-05 MED ORDER — ATORVASTATIN CALCIUM 80 MG PO TABS: 80.0000 mg | ORAL_TABLET | Freq: Every day | ORAL | 3 refills | Status: DC

## 2017-05-05 MED ORDER — TESTOSTERONE CYPIONATE 200 MG/ML IM SOLN
200.0000 mg | Freq: Once | INTRAMUSCULAR | Status: AC
  Administered 2017-05-05: 200 mg via INTRAMUSCULAR

## 2017-05-05 MED ORDER — LISINOPRIL-HYDROCHLOROTHIAZIDE 10-12.5 MG PO TABS: 1.0000 | ORAL_TABLET | Freq: Every day | ORAL | 3 refills | Status: DC

## 2017-05-05 NOTE — Patient Instructions (Signed)
Thank you for coming in today. Return for a nurse visit every 2 weeks for testosterone shots.  Get labs in the morning fasting before 10am about 1 week after a testosterone shot in July.  We will contact you with results.   You should also be hearing about a home sleep test in a few days.  If it has been a week and you have not heard anything please let me know.   Testosterone injection What is this medicine? TESTOSTERONE (tes TOS ter one) is the main male hormone. It supports normal male development such as muscle growth, facial hair, and deep voice. It is used in males to treat low testosterone levels. This medicine may be used for other purposes; ask your health care provider or pharmacist if you have questions. COMMON BRAND NAME(S): Andro-L.A., Aveed, Delatestryl, Depo-Testosterone, Virilon What should I tell my health care provider before I take this medicine? They need to know if you have any of these conditions: -cancer -diabetes -heart disease -kidney disease -liver disease -lung disease -prostate disease -an unusual or allergic reaction to testosterone, other medicines, foods, dyes, or preservatives -pregnant or trying to get pregnant -breast-feeding How should I use this medicine? This medicine is for injection into a muscle. It is usually given by a health care professional in a hospital or clinic setting. Contact your pediatrician regarding the use of this medicine in children. While this medicine may be prescribed for children as young as 41 years of age for selected conditions, precautions do apply. Overdosage: If you think you have taken too much of this medicine contact a poison control center or emergency room at once. NOTE: This medicine is only for you. Do not share this medicine with others. What if I miss a dose? Try not to miss a dose. Your doctor or health care professional will tell you when your next injection is due. Notify the office if you are unable to  keep an appointment. What may interact with this medicine? -medicines for diabetes -medicines that treat or prevent blood clots like warfarin -oxyphenbutazone -propranolol -steroid medicines like prednisone or cortisone This list may not describe all possible interactions. Give your health care provider a list of all the medicines, herbs, non-prescription drugs, or dietary supplements you use. Also tell them if you smoke, drink alcohol, or use illegal drugs. Some items may interact with your medicine. What should I watch for while using this medicine? Visit your doctor or health care professional for regular checks on your progress. They will need to check the level of testosterone in your blood. This medicine is only approved for use in men who have low levels of testosterone related to certain medical conditions. Heart attacks and strokes have been reported with the use of this medicine. Notify your doctor or health care professional and seek emergency treatment if you develop breathing problems; changes in vision; confusion; chest pain or chest tightness; sudden arm pain; severe, sudden headache; trouble speaking or understanding; sudden numbness or weakness of the face, arm or leg; loss of balance or coordination. Talk to your doctor about the risks and benefits of this medicine. This medicine may affect blood sugar levels. If you have diabetes, check with your doctor or health care professional before you change your diet or the dose of your diabetic medicine. Testosterone injections are not commonly used in women. Women should inform their doctor if they wish to become pregnant or think they might be pregnant. There is a potential for serious side  effects to an unborn child. Talk to your health care professional or pharmacist for more information. Talk with your doctor or health care professional about your birth control options while taking this medicine. This drug is banned from use in athletes  by most athletic organizations. What side effects may I notice from receiving this medicine? Side effects that you should report to your doctor or health care professional as soon as possible: -allergic reactions like skin rash, itching or hives, swelling of the face, lips, or tongue -breast enlargement -breathing problems -changes in emotions or moods -deep or hoarse voice -irregular menstrual periods -signs and symptoms of liver injury like dark yellow or brown urine; general ill feeling or flu-like symptoms; light-colored stools; loss of appetite; nausea; right upper belly pain; unusually weak or tired; yellowing of the eyes or skin -stomach pain -swelling of the ankles, feet, hands -too frequent or persistent erections -trouble passing urine or change in the amount of urine Side effects that usually do not require medical attention (report to your doctor or health care professional if they continue or are bothersome): -acne -change in sex drive or performance -facial hair growth -hair loss -headache This list may not describe all possible side effects. Call your doctor for medical advice about side effects. You may report side effects to FDA at 1-800-FDA-1088. Where should I keep my medicine? Keep out of the reach of children. This medicine can be abused. Keep your medicine in a safe place to protect it from theft. Do not share this medicine with anyone. Selling or giving away this medicine is dangerous and against the law. Store at room temperature between 20 and 25 degrees C (68 and 77 degrees F). Do not freeze. Protect from light. Follow the directions for the product you are prescribed. Throw away any unused medicine after the expiration date. NOTE: This sheet is a summary. It may not cover all possible information. If you have questions about this medicine, talk to your doctor, pharmacist, or health care provider.  2018 Elsevier/Gold Standard (2016-01-06 07:33:55)

## 2017-05-05 NOTE — Progress Notes (Signed)
[name] is a 41 y.o. male who [facility] today for follow-up fatigue low libido and low testosterone and hyperlipidemia.  At the last visit patient had testosterone rechecked along with lipids. The testosterone was significantly low at less than 200 and the lipids are significantly elevated. He is here today to follow-up the lab results. He notes decreased libido and fatigue and is interested in starting testosterone supplementation.  Additionally he was prescribed atorvastatin is taking it daily with no muscle aches or pains and feels well.  Lastly he notes that he does feel fatigued and snores frequently. In the past he was tested  positive for sleep apnea but does not use a CPAP machine. The sleep study was many years ago and he can't remember where it was.   Past Medical History:  Diagnosis Date  . High cholesterol   . Hypertension    Past Surgical History:  Procedure Laterality Date  . KNEE ARTHROSCOPY W/ ACL RECONSTRUCTION     Social History  Substance Use Topics  . Smoking status: Never Smoker  . Smokeless tobacco: Current User    Types: Chew  . Alcohol use 0.0 oz/week     Comment: weekly   family history includes Diabetes in his paternal uncle; Heart disease in his father.  ROS as above:  Medications: Current Outpatient Prescriptions  Medication Sig Dispense Refill  . atorvastatin (LIPITOR) 80 MG tablet Take 1 tablet (80 mg total) by mouth daily. 30 tablet 3  . lisinopril-hydrochlorothiazide (PRINZIDE,ZESTORETIC) 10-12.5 MG tablet Take 1 tablet by mouth daily. 30 tablet 3   No current facility-administered medications for this visit.    No Known Allergies  Health Maintenance Health Maintenance  Topic Date Due  . HIV Screening  04/03/1991  . INFLUENZA VACCINE  07/16/2017  . TETANUS/TDAP  04/17/2026     Exam:  BP  125/79   Pulse 100   Wt (!) 348 lb (157.9 kg)   BMI 47.20 kg/m  Gen: Well NAD Morbidly obese HEENT: EOMI,  MMM large neck Lungs: Normal work of breathing. CTABL Heart: RRR no MRG Abd: NABS, Soft. Nondistended, Nontender Exts: Brisk capillary refill, warm and well perfused.   Lab Results  Component Value Date   TESTOSTERONE 118 (L) 04/03/2017   Lab Results  Component Value Date   CHOL 470 (H) 04/03/2017   HDL 38 (L) 04/03/2017   LDLCALC 357 (H) 04/17/2016   TRIG 176 (H) 04/03/2017   CHOLHDL 12.4 (H) 04/03/2017      No results found for this or any previous visit (from the past 72 hour(s)). No results found.    Assessment and Plan: 41 y.o. male with  Low testosterone: Symptomatic plan to start injectable testosterone. We'll recheck labs in about 6 weeks.  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 Plan to start high-dose Lipitor. Recheck lipids and metabolic panel in about 6 weeks.  Snoring and fatigue: Concerning for sleep apnea. This is associated with morbid obesity. Home sleep study pending.    Orders Placed This Encounter  Procedures  . CBC  . COMPLETE METABOLIC PANEL WITH GFR  . Lipid Panel w/reflex Direct LDL  . Testosterone  . PSA  . Home sleep test    Standing Status:   Future    Standing Expiration Date:   05/05/2018    Order Specific Question:   Where should this test be performed:    Answer:   [facility]  Meds ordered this encounter  Medications  . atorvastatin (LIPITOR) 80 MG tablet    Sig: Take 1 tablet (80 mg total) by mouth daily.    Dispense:  30 tablet    Refill:  3  . lisinopril-hydrochlorothiazide (PRINZIDE,ZESTORETIC) 10-12.5 MG tablet    Sig: Take 1 tablet by mouth daily.    Dispense:  30 tablet    Refill:  3  . testosterone cypionate (DEPOTESTOSTERONE CYPIONATE) injection 200 mg     Discussed warning signs or symptoms. Please see discharge instructions. Patient expresses understanding.

## 2017-05-08 ENCOUNTER — Telehealth: Payer: Self-pay

## 2017-05-08 NOTE — Telephone Encounter (Signed)
Pt returned call and was advised to contact his insurance regarding eligibility for sleep study. Pt verbalized understanding and stated that he will call his insurance in the morning.

## 2017-05-08 NOTE — Telephone Encounter (Signed)
[facility] called stating that she contacted pts insurance for auth for home sleep study and was informed that there were questions about a pts insurance eligibility. The insurance representative advised that the pt and provider needed to call in together to verify eligibility. Called pt and left detailed vm requesting a call back to discuss matter.

## 2017-05-19 ENCOUNTER — Ambulatory Visit (INDEPENDENT_AMBULATORY_CARE_PROVIDER_SITE_OTHER): Payer: BC Managed Care – PPO | Admitting: Family Medicine

## 2017-05-19 VITALS — BP 131/83 | HR 102 | Ht 71.0 in | Wt 355.0 lb

## 2017-05-19 DIAGNOSIS — E349 Endocrine disorder, unspecified: Secondary | ICD-10-CM

## 2017-05-19 MED ORDER — TESTOSTERONE CYPIONATE 200 MG/ML IM SOLN
200.0000 mg | Freq: Once | INTRAMUSCULAR | Status: AC
  Administered 2017-05-19: 200 mg via INTRAMUSCULAR

## 2017-05-19 NOTE — Progress Notes (Signed)
   Subjective:    Patient ID: Dylan CullensWendell Thurow Jr., male    DOB: Jul 21, 1976, 41 y.o.   MRN: [id number]  HPI Pt is here for a Testosterone Injection today. Pt denies mood swings, palpitations, chest pain, or any medication problems.    Review of Systems     Objective:   Physical Exam        Assessment & Plan:  Pt tolerated injection in RUOQ well and without complications. Pt advised to schedule a nurse visit in   weeks.

## 2017-06-02 ENCOUNTER — Ambulatory Visit (INDEPENDENT_AMBULATORY_CARE_PROVIDER_SITE_OTHER): Payer: BC Managed Care – PPO | Admitting: Family Medicine

## 2017-06-02 VITALS — BP 116/64 | HR 90 | Ht 69.69 in | Wt 352.0 lb

## 2017-06-02 DIAGNOSIS — E349 Endocrine disorder, unspecified: Secondary | ICD-10-CM | POA: Diagnosis not present

## 2017-06-02 MED ORDER — TESTOSTERONE CYPIONATE 200 MG/ML IM SOLN
200.0000 mg | Freq: Once | INTRAMUSCULAR | Status: AC
  Administered 2017-06-02: 200 mg via INTRAMUSCULAR

## 2017-06-02 NOTE — Progress Notes (Signed)
Pt here for testosterone injection. Pt denies any SOB,CP or other complications with injections. Injection tolerated well given in LUOQ. Pt to RTC in 2 weeks for next injection. Pt reminded to have labwork done prior ot his next injection in July. Laureen Ochs.Annalysse Shoemaker, Viann Shoveonya Lynetta

## 2017-06-16 ENCOUNTER — Ambulatory Visit (INDEPENDENT_AMBULATORY_CARE_PROVIDER_SITE_OTHER): Payer: BC Managed Care – PPO | Admitting: Family Medicine

## 2017-06-16 VITALS — BP 126/84 | HR 99

## 2017-06-16 DIAGNOSIS — E349 Endocrine disorder, unspecified: Secondary | ICD-10-CM | POA: Diagnosis not present

## 2017-06-16 MED ORDER — TESTOSTERONE CYPIONATE 200 MG/ML IM SOLN
200.0000 mg | Freq: Once | INTRAMUSCULAR | Status: AC
  Administered 2017-06-16: 200 mg via INTRAMUSCULAR

## 2017-06-16 NOTE — Progress Notes (Signed)
Testosterone injection given RUOQ without complications.

## 2017-06-30 ENCOUNTER — Ambulatory Visit (INDEPENDENT_AMBULATORY_CARE_PROVIDER_SITE_OTHER): Payer: BC Managed Care – PPO | Admitting: Family Medicine

## 2017-06-30 VITALS — BP 127/85 | HR 98 | Wt 347.0 lb

## 2017-06-30 DIAGNOSIS — E349 Endocrine disorder, unspecified: Secondary | ICD-10-CM | POA: Diagnosis not present

## 2017-06-30 LAB — LIPID PANEL W/REFLEX DIRECT LDL
Cholesterol: 219 mg/dL — ABNORMAL HIGH (ref <200)
HDL: 38 mg/dL — AB (ref >40)
LDL-CHOLESTEROL: 157 mg/dL — AB
NON-HDL CHOLESTEROL (CALC): 181 mg/dL — AB (ref <130)
Total CHOL/HDL Ratio: 5.8 Ratio — ABNORMAL HIGH (ref <5.0)
Triglycerides: 118 mg/dL (ref <150)

## 2017-06-30 LAB — COMPLETE METABOLIC PANEL WITH GFR
ALT: 27 U/L (ref 9–46)
AST: 36 U/L (ref 10–40)
Albumin: 3.8 g/dL (ref 3.6–5.1)
Alkaline Phosphatase: 90 U/L (ref 40–115)
BILIRUBIN TOTAL: 1.1 mg/dL (ref 0.2–1.2)
BUN: 6 mg/dL — ABNORMAL LOW (ref 7–25)
CHLORIDE: 99 mmol/L (ref 98–110)
CO2: 28 mmol/L (ref 20–31)
Calcium: 8.6 mg/dL (ref 8.6–10.3)
Creat: 1.07 mg/dL (ref 0.60–1.35)
GFR, EST NON AFRICAN AMERICAN: 86 mL/min (ref >=60)
GFR, Est African American: >89 mL/min (ref >=60)
GLUCOSE: 92 mg/dL (ref 65–99)
Potassium: 3.7 mmol/L (ref 3.5–5.3)
SODIUM: 139 mmol/L (ref 135–146)
TOTAL PROTEIN: 6.5 g/dL (ref 6.1–8.1)

## 2017-06-30 LAB — CBC
HCT: 46 % (ref 38.5–50.0)
Hemoglobin: 15.7 g/dL (ref 13.2–17.1)
MCH: 33.3 pg — AB (ref 27.0–33.0)
MCHC: 34.1 g/dL (ref 32.0–36.0)
MCV: 97.5 fL (ref 80.0–100.0)
MPV: 9.1 fL (ref 7.5–12.5)
PLATELETS: 347 10*3/uL (ref 140–400)
RBC: 4.72 MIL/uL (ref 4.20–5.80)
RDW: 15.6 % — ABNORMAL HIGH (ref 11.0–15.0)
WBC: 8.3 10*3/uL (ref 3.8–10.8)

## 2017-06-30 MED ORDER — TESTOSTERONE CYPIONATE 200 MG/ML IM SOLN
200.0000 mg | Freq: Once | INTRAMUSCULAR | Status: AC
  Administered 2017-06-30: 200 mg via INTRAMUSCULAR

## 2017-06-30 NOTE — Progress Notes (Signed)
Pt is here for testosterone injection.  Denies SOB, mood swings, and palpitations. Pt tolerated injection in the LUOQ.  Advised to pt make next appointment in 14 days for next injection. -EH/RMA

## 2017-07-01 LAB — TESTOSTERONE: TESTOSTERONE: 884 ng/dL — AB (ref 250–827)

## 2017-07-01 LAB — PSA: PSA: 0.6 ng/mL (ref <=4.0)

## 2017-07-15 ENCOUNTER — Ambulatory Visit (INDEPENDENT_AMBULATORY_CARE_PROVIDER_SITE_OTHER): Payer: BC Managed Care – PPO | Admitting: Sports Medicine

## 2017-07-15 VITALS — BP 136/84 | HR 102

## 2017-07-15 DIAGNOSIS — E291 Testicular hypofunction: Secondary | ICD-10-CM

## 2017-07-15 MED ORDER — TESTOSTERONE CYPIONATE 200 MG/ML IM SOLN
200.0000 mg | Freq: Once | INTRAMUSCULAR | Status: AC
  Administered 2017-07-15: 200 mg via INTRAMUSCULAR

## 2017-07-15 NOTE — Progress Notes (Signed)
Patient came into office today for testosterone injection. Denies chest pain, shortness of breath, headaches and problems associated with taking this medication. Patient states he has had no abnornal mood swings. Patient tolerated injection in RUOQ well without complications. Patient advised to schedule his next injection for 2 weeks from today. 

## 2017-07-29 ENCOUNTER — Ambulatory Visit (INDEPENDENT_AMBULATORY_CARE_PROVIDER_SITE_OTHER): Payer: BC Managed Care – PPO | Admitting: Family Medicine

## 2017-07-29 VITALS — BP 136/85 | HR 108

## 2017-07-29 DIAGNOSIS — E291 Testicular hypofunction: Secondary | ICD-10-CM | POA: Diagnosis not present

## 2017-07-29 MED ORDER — TESTOSTERONE CYPIONATE 200 MG/ML IM SOLN
200.0000 mg | Freq: Once | INTRAMUSCULAR | Status: AC
  Administered 2017-07-29: 200 mg via INTRAMUSCULAR

## 2017-07-29 NOTE — Progress Notes (Signed)
Patient came into office today for testosterone injection. Denies chest pain, shortness of breath, headaches and problems associated with taking this medication. Patient states he has had no abnornal mood swings. Patient tolerated injection in LUOQ well without complications. Patient advised to schedule his next injection for 2 weeks from today. 

## 2017-08-12 ENCOUNTER — Ambulatory Visit (INDEPENDENT_AMBULATORY_CARE_PROVIDER_SITE_OTHER): Payer: BC Managed Care – PPO | Admitting: Family Medicine

## 2017-08-12 VITALS — BP 141/91 | HR 107 | Wt 345.0 lb

## 2017-08-12 DIAGNOSIS — Z23 Encounter for immunization: Secondary | ICD-10-CM | POA: Diagnosis not present

## 2017-08-12 DIAGNOSIS — E349 Endocrine disorder, unspecified: Secondary | ICD-10-CM

## 2017-08-12 DIAGNOSIS — I10 Essential (primary) hypertension: Secondary | ICD-10-CM

## 2017-08-12 MED ORDER — TESTOSTERONE CYPIONATE 200 MG/ML IM SOLN
200.0000 mg | Freq: Once | INTRAMUSCULAR | Status: AC
  Administered 2017-08-12: 200 mg via INTRAMUSCULAR

## 2017-08-12 NOTE — Progress Notes (Signed)
   Subjective:    Patient ID: Dylan Cullens., male    DOB: [birth year], 41 y.o.   MRN: [id number]  HPI  Dylan Yahnke. is here for a testosterone injection. Denies chest pain, shortness of breath, headaches or mood changes.   Hypertension - His blood pressure is elevated today. He states he did take his blood pressure medication this morning.   Review of Systems     Objective:   Physical Exam        Assessment & Plan:  Testosterone deficiency - Testosterone 200 mg IM given. Patient tolerated injection well without complications. Patient advised to schedule next injection 14 days from today.   HTN - After recheck blood pressure was still slightly elevated. Scheduled patient to return in 2 weeks to see PCP for elevated blood pressure.

## 2017-08-17 ENCOUNTER — Encounter (HOSPITAL_BASED_OUTPATIENT_CLINIC_OR_DEPARTMENT_OTHER): Payer: Self-pay | Admitting: Emergency Medicine

## 2017-08-17 ENCOUNTER — Emergency Department (HOSPITAL_BASED_OUTPATIENT_CLINIC_OR_DEPARTMENT_OTHER)
Admission: EM | Admit: 2017-08-17 | Discharge: 2017-08-17 | Disposition: A | Discharge status: Home / Self Care | Payer: BC Managed Care – PPO | Attending: Emergency Medicine | Admitting: Emergency Medicine

## 2017-08-17 DIAGNOSIS — M5416 Radiculopathy, lumbar region: Secondary | ICD-10-CM | POA: Diagnosis not present

## 2017-08-17 DIAGNOSIS — I10 Essential (primary) hypertension: Secondary | ICD-10-CM | POA: Insufficient documentation

## 2017-08-17 DIAGNOSIS — F17220 Nicotine dependence, chewing tobacco, uncomplicated: Secondary | ICD-10-CM | POA: Insufficient documentation

## 2017-08-17 DIAGNOSIS — Z79899 Other long term (current) drug therapy: Secondary | ICD-10-CM | POA: Insufficient documentation

## 2017-08-17 DIAGNOSIS — M545 Low back pain: Secondary | ICD-10-CM | POA: Diagnosis present

## 2017-08-17 MED ORDER — KETOROLAC TROMETHAMINE 60 MG/2ML IM SOLN
60.0000 mg | Freq: Once | INTRAMUSCULAR | Status: AC
  Administered 2017-08-17: 60 mg via INTRAMUSCULAR
  Filled 2017-08-17: qty 2

## 2017-08-17 MED ORDER — NAPROXEN SODIUM 550 MG PO TABS: 550.0000 mg | ORAL_TABLET | Freq: Two times a day (BID) | ORAL | 0 refills | Status: DC

## 2017-08-17 MED ORDER — CYCLOBENZAPRINE HCL 10 MG PO TABS: 10.0000 mg | ORAL_TABLET | Freq: Two times a day (BID) | ORAL | 0 refills | Status: DC | PRN

## 2017-08-17 NOTE — ED Triage Notes (Signed)
R lower back pain radiating into R leg, denies injury.

## 2017-08-17 NOTE — ED Provider Notes (Signed)
[facility] Provider Note   CSN: [id number] Arrival date & time: 08/17/17  1417     History   Chief Complaint Chief Complaint  Patient presents with  . Back Pain    HPI Dylan CullensWendell Ke Jr. is a 41 y.o. male.  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  No known injury but thinks he might have lifted something wrong   The history is provided by the patient.  Back Pain   This is a new problem. The current episode started 2 days ago. The problem occurs constantly. The problem has been gradually worsening. The pain is associated with no known injury. The pain is present in the lumbar spine. The quality of the pain is described as shooting and burning. The pain radiates to the right thigh, right knee and right foot. The pain is at a severity of 8/10. The pain is moderate. The symptoms are aggravated by bending, twisting and certain positions (prolonged standing and walking). The pain is the same all the time. Associated symptoms include leg pain. Pertinent negatives include no chest pain, no fever, no numbness, no abdominal pain, no bowel incontinence, no bladder incontinence, no dysuria and no weakness. He has tried NSAIDs for the symptoms. The treatment provided no relief. Risk factors include obesity and lack of exercise.    Past Medical History:  Diagnosis Date  . High cholesterol   . Hypertension     Patient Active Problem List   Diagnosis Date Noted  . Cramping of hands 05/28/2016  . Hyperlipidemia 04/19/2016  . Testosterone deficiency 04/19/2016  . Vitamin D deficiency 04/19/2016  . HTN (hypertension) 04/17/2016  . Morbid obesity (HCC) 04/17/2016  . Sleep apnea 04/17/2016  . SOB (shortness of breath) on exertion 04/17/2016  . Left knee pain 04/17/2016  . Paresthesia and pain of both upper extremities 04/17/2016    Past Surgical History:  Procedure Laterality Date  . KNEE ARTHROSCOPY W/ ACL  RECONSTRUCTION         Home Medications    Prior to Admission medications   Medication Sig Start Date End Date Taking? Authorizing Provider  atorvastatin (LIPITOR) 80 MG tablet Take 1 tablet (80 mg total) by mouth daily. 05/05/17   Rodolph Bongorey, Evan S, MD  lisinopril-hydrochlorothiazide (PRINZIDE,ZESTORETIC) 10-12.5 MG tablet Take 1 tablet by mouth daily. 05/05/17   Rodolph Bongorey, Evan S, MD  testosterone cypionate (DEPOTESTOSTERONE CYPIONATE) 200 MG/ML injection Inject 200 mg into the muscle every 14 (fourteen) days.    [provider]    Family History Family History  Problem Relation Age of Onset  . Heart disease Father   . Diabetes Paternal Uncle     Social History Social History  Substance Use Topics  . Smoking status: Never Smoker  . Smokeless tobacco: Current User    Types: Chew  . Alcohol use 0.0 oz/week     Comment: weekly     Allergies   Patient has no known allergies.   Review of Systems Review of Systems  Constitutional: Negative for fever.  Cardiovascular: Negative for chest pain.  Gastrointestinal: Negative for abdominal pain and bowel incontinence.  Genitourinary: Negative for bladder incontinence and dysuria.  Musculoskeletal: Positive for back pain.  Neurological: Negative for weakness and numbness.  All other systems reviewed and are negative.    Physical Exam Updated Vital Signs BP (!) 132/119 (BP Location: Left Arm)   Pulse (!) 115   Temp 98.5 F (36.9 C) (  Oral)   Resp 18   Ht 5\' 11"  (1.803 m)   Wt (!) 156.5 kg (345 lb)   SpO2 97%   BMI 48.12 kg/m   Physical Exam  Constitutional: He is oriented to person, place, and time. He appears well-developed and well-nourished. No distress.  HENT:  Head: Normocephalic and atraumatic.  Mouth/Throat: Oropharynx is clear and moist.  Eyes: Pupils are equal, round, and reactive to light. EOM are normal.  Neck: Normal range of motion. Neck supple. No spinous process tenderness and no muscular tenderness  present. No neck rigidity. Normal range of motion present.  Cardiovascular: Regular rhythm, normal heart sounds and intact distal pulses.  Tachycardia present.   No murmur heard. Pulmonary/Chest: Effort normal and breath sounds normal. He has no wheezes. He has no rales.  Abdominal: Soft. He exhibits no distension. There is no tenderness. There is no CVA tenderness.  Musculoskeletal: He exhibits no tenderness.       Lumbar back: He exhibits decreased range of motion. He exhibits no swelling, no deformity and normal pulse.       Back:  Neurological: He is alert and oriented to person, place, and time. He has normal strength. No sensory deficit. Coordination normal.  Reflex Scores:      Patellar reflexes are 1+ on the right side and 1+ on the left side. Normal strength bilaterally in quadriceps, calves and plantar/dorsiflexion of feet.  No rashes present over the back or skin  Skin: Skin is warm and dry. No rash noted.  Psychiatric: He has a normal mood and affect.  Nursing note and vitals reviewed.    ED Treatments / Results  Labs (all labs ordered are listed, but only abnormal results are displayed) Labs Reviewed - No data to display  EKG  EKG Interpretation None       Radiology No results found.  Procedures Procedures (including critical care time)  Medications Ordered in ED Medications  ketorolac (TORADOL) injection 60 mg (not administered)     Initial Impression / Assessment and Plan / ED Course  I have reviewed the triage vital signs and the nursing notes.  Pertinent labs & imaging results that were available during my care of the patient were reviewed by me and considered in my medical decision making (see chart for details).     Pt with gradual onset of back pain suggestive of radiculopathy.  No neurovascular compromise and no incontinence.  Pt has no infectious sx, hx of CA  or other red flags concerning for pathologic back pain.  Pt is able to ambulate but is  painful.  Normal strength and reflexes on exam.  Denies trauma. Will give pt pain control and to return for developement of above sx.  Has f/u with his PCP next week for blood pressure and will f/u with back symptoms as well.   Here pt noted to be mildly tachycardic 104 in the room however this seems to be a more chronic problem as pt's last 2 office visits which had nothing to do with back pain and pt was assymptomatic just for f/u had HR's around 107 and 108.  Pt denies any chest pain, abd pain, SOB or other concerns.  He took his home meds today already and BP 132/119 which is not much different than office visits.   Final Clinical Impressions(s) / ED Diagnoses   Final diagnoses:  Lumbar radiculopathy, acute    New Prescriptions New Prescriptions   CYCLOBENZAPRINE (FLEXERIL) 10 MG TABLET  Take 1 tablet (10 mg total) by mouth 2 (two) times daily as needed for muscle spasms.   NAPROXEN SODIUM (ANAPROX) 550 MG TABLET    Take 1 tablet (550 mg total) by mouth 2 (two) times daily with a meal.     Gwyneth Sprout, MD 08/17/17 1520

## 2017-08-17 NOTE — Discharge Instructions (Signed)
In addition to taking the naproxen 2 times a day add 2 tabs (1000mg ) of tylenol with it.  Return immediately for weakness of the leg, trouble walking or problems with urine or bowel movements.

## 2017-08-26 ENCOUNTER — Encounter: Payer: Self-pay | Admitting: Family Medicine

## 2017-08-26 ENCOUNTER — Ambulatory Visit (INDEPENDENT_AMBULATORY_CARE_PROVIDER_SITE_OTHER): Payer: BC Managed Care – PPO | Admitting: Family Medicine

## 2017-08-26 VITALS — BP 127/79 | HR 98 | Wt 349.0 lb

## 2017-08-26 DIAGNOSIS — I10 Essential (primary) hypertension: Secondary | ICD-10-CM | POA: Diagnosis not present

## 2017-08-26 DIAGNOSIS — E782 Mixed hyperlipidemia: Secondary | ICD-10-CM | POA: Diagnosis not present

## 2017-08-26 DIAGNOSIS — E349 Endocrine disorder, unspecified: Secondary | ICD-10-CM | POA: Diagnosis not present

## 2017-08-26 MED ORDER — TESTOSTERONE CYPIONATE 200 MG/ML IM SOLN
200.0000 mg | Freq: Once | INTRAMUSCULAR | Status: AC
  Administered 2017-08-26: 200 mg via INTRAMUSCULAR

## 2017-08-26 NOTE — Patient Instructions (Signed)
Thank you for coming in today. Nurse visit in 2 weeks we will switch to  testosterone weekly and start working on self teaching.  Recheck with me in 6-8 weeks.   Call or go to the emergency room if you get worse, have trouble breathing, have chest pains, or palpitations.

## 2017-08-26 NOTE — Progress Notes (Signed)
Dylan Mullett. is a 41 y.o. male who presents to [name] today for follow-up hypertension and testosterone.  Hypertension: Patient was here for a nurse testosterone injection a few weeks ago and was found to have elevated blood pressure. He continues to take the lisinopril/hydrochlorothiazide daily and denies chest pain palpitations or shortness of breath. He feels well and notes that blood pressures typically well controlled.  Testosterone: Patient continues to receive 200 mg of testosterone injected every 2 weeks with a nurse visit. He notes the medication works about for week and then it tends to wear off. He wonders if he can get the injections more frequent. He is not interested in topical gel.  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 He denies any significant muscle aches or pains.   Past Medical History:  Diagnosis Date  . High cholesterol   . Hypertension    Past Surgical History:  Procedure Laterality Date  . KNEE ARTHROSCOPY W/ ACL RECONSTRUCTION     Social History  Substance Use Topics  . Smoking status: Never Smoker  . Smokeless tobacco: Current User    Types: Chew  . Alcohol use 0.0 oz/week     Comment: weekly   family history includes Diabetes in his paternal uncle; Heart disease in his father.  ROS as above:  Medications: Current Outpatient Prescriptions  Medication Sig Dispense Refill  . atorvastatin (LIPITOR) 80 MG tablet Take 1 tablet (80 mg total) by mouth daily. 30 tablet 3  . cyclobenzaprine (FLEXERIL) 10 MG tablet Take 1 tablet (10 mg total) by mouth 2 (two) times daily as needed for muscle spasms. 20 tablet 0  . lisinopril-hydrochlorothiazide (PRINZIDE,ZESTORETIC) 10-12.5 MG tablet Take 1 tablet by mouth daily. 30 tablet 3  . naproxen sodium (ANAPROX) 550 MG tablet Take 1  tablet (550 mg total) by mouth 2 (two) times daily with a meal. 20 tablet 0  . testosterone cypionate (DEPOTESTOSTERONE CYPIONATE) 200 MG/ML injection Inject 200 mg into the muscle every 14 (fourteen) days.     No current facility-administered medications for this visit.    No Known Allergies  Health Maintenance Health Maintenance  Topic Date Due  . HIV Screening  04/03/1991  . TETANUS/TDAP  04/17/2026  . INFLUENZA VACCINE  Completed     Exam:  BP 127/79   Pulse 98   Wt (!) 349 lb (158.3 kg)   BMI 48.68 kg/m  Gen: Well NAD Morbidly obese HEENT: EOMI,  MMM Lungs: Normal work of breathing. CTABL Heart: RRR no MRG Abd: NABS, Soft. Nondistended, Nontender Exts: Brisk capillary refill, warm and well perfused.   Lab Results  Component Value Date   TESTOSTERONE 884 (H) 06/30/2017     Chemistry      Component Value Date/Time   NA 139 06/30/2017 0809   K 3.7 06/30/2017 0809   CL 99 06/30/2017 0809   CO2 28 06/30/2017 0809   BUN 6 (L) 06/30/2017 0809   CREATININE 1.07 06/30/2017 0809      Component Value Date/Time   CALCIUM 8.6 06/30/2017 0809   ALKPHOS 90 06/30/2017 0809   AST 36 06/30/2017 0809   ALT 27 06/30/2017 0809   BILITOT 1.1 06/30/2017 0809     Lab Results  Component Value Date   WBC 8.3 06/30/2017   HGB 15.7 06/30/2017   HCT 46.0 06/30/2017   MCV 97.5 06/30/2017   PLT 347 06/30/2017   Lab Results  Component Value Date   CHOL 219 (H) 06/30/2017   HDL 38 (L) 06/30/2017   LDLCALC 357 (H) 04/17/2016   TRIG 118 06/30/2017   CHOLHDL 5.8 (H) 06/30/2017      No results found for this or any previous visit (from the past 72 hour(s)). No results found.    Assessment and Plan: 41 y.o. male with  Hypertension: Blood pressure goal today. Plan for watchful waiting with continued medication.  Testosterone: Patient had therapeutic levels of testosterone following recheck after starting injections. With his symptoms of the testosterone wearing off too  soon I think it's reasonable to decrease the frequency to 100 mg once per week. He'll start this in 2 weeks with a nurse visit. We'll start teaching for home injections at this time to. Hopefully he'll be able to do the injections on his own at home and not have to come in weekly for a nurse visit. Recheck with physician visit in about 6-8 weeks.  Hyperlipidemia: Cholesterol significantly improved following statin initiation. Continue current medications as patient seems to be tolerating it well.    No orders of the defined types were placed in this encounter.  Meds ordered this encounter  Medications  . testosterone cypionate (DEPOTESTOSTERONE CYPIONATE) injection 200 mg     Discussed warning signs or symptoms. Please see discharge instructions. Patient expresses understanding.

## 2017-09-01 ENCOUNTER — Encounter: Payer: Self-pay | Admitting: Family Medicine

## 2017-09-09 ENCOUNTER — Other Ambulatory Visit: Payer: Self-pay

## 2017-09-09 ENCOUNTER — Telehealth: Payer: Self-pay

## 2017-09-09 ENCOUNTER — Ambulatory Visit (INDEPENDENT_AMBULATORY_CARE_PROVIDER_SITE_OTHER): Payer: BC Managed Care – PPO | Admitting: Family Medicine

## 2017-09-09 VITALS — BP 138/84 | HR 102 | Ht 71.0 in | Wt 345.0 lb

## 2017-09-09 DIAGNOSIS — E349 Endocrine disorder, unspecified: Secondary | ICD-10-CM

## 2017-09-09 DIAGNOSIS — E291 Testicular hypofunction: Secondary | ICD-10-CM

## 2017-09-09 MED ORDER — NAPROXEN 500 MG PO TABS: 500.0000 mg | ORAL_TABLET | Freq: Two times a day (BID) | ORAL | 0 refills | Status: DC

## 2017-09-09 MED ORDER — "NEEDLE (DISP) 18G X 1-1/2"" MISC": 0 refills | Status: DC

## 2017-09-09 MED ORDER — TESTOSTERONE CYPIONATE 200 MG/ML IM SOLN: 100.0000 mg | INTRAMUSCULAR | 1 refills | Status: DC

## 2017-09-09 MED ORDER — CYCLOBENZAPRINE HCL 10 MG PO TABS: 10.0000 mg | ORAL_TABLET | Freq: Two times a day (BID) | ORAL | 3 refills | Status: DC | PRN

## 2017-09-09 MED ORDER — ALCOHOL PREP PADS: MEDICATED_PAD | 0 refills | Status: DC

## 2017-09-09 MED ORDER — TESTOSTERONE CYPIONATE 200 MG/ML IM SOLN
100.0000 mg | Freq: Once | INTRAMUSCULAR | Status: AC
  Administered 2017-09-09: 100 mg via INTRAMUSCULAR

## 2017-09-09 MED ORDER — "SYRINGE/NEEDLE (DISP) 22G X 1-1/2"" 3 ML MISC": 0 refills | Status: DC

## 2017-09-09 NOTE — Progress Notes (Signed)
Pt is here for testosterone injection.  Pt does not have his own medication and supplies, so I will order them, and he will start with instruction next week.  Denies chest pain, SOB, headaches, problems with medication or mood changes.   Injection given in LUOQ, 100 mg per note on 9-11.  Pt tolerated injection well and without complications.  Pt advised to schedule next injection in 7 days.

## 2017-09-09 NOTE — Telephone Encounter (Signed)
Flexeril and Naproxen sent to [facility] in [place].

## 2017-09-09 NOTE — Progress Notes (Unsigned)
test

## 2017-09-09 NOTE — Telephone Encounter (Signed)
Notified pt. 

## 2017-09-09 NOTE — Telephone Encounter (Signed)
Pt is requesting a refill on Flexeril and anaprox.  Please advise.

## 2017-09-10 ENCOUNTER — Telehealth: Payer: Self-pay | Admitting: *Deleted

## 2017-09-10 NOTE — Telephone Encounter (Signed)
Pre Authorization sent to cover my meds. [id number]

## 2017-09-16 ENCOUNTER — Ambulatory Visit (INDEPENDENT_AMBULATORY_CARE_PROVIDER_SITE_OTHER): Payer: BC Managed Care – PPO | Admitting: Family Medicine

## 2017-09-16 ENCOUNTER — Ambulatory Visit: Payer: BC Managed Care – PPO

## 2017-09-16 VITALS — BP 134/87 | HR 103

## 2017-09-16 DIAGNOSIS — E349 Endocrine disorder, unspecified: Secondary | ICD-10-CM

## 2017-09-16 MED ORDER — TESTOSTERONE CYPIONATE 200 MG/ML IM SOLN
100.0000 mg | Freq: Once | INTRAMUSCULAR | Status: AC
  Administered 2017-09-16: 100 mg via INTRAMUSCULAR

## 2017-09-16 NOTE — Progress Notes (Signed)
   Subjective:    Patient ID: Dylan Cullens., male    DOB: January 31, 1976, 41 y.o.   MRN: [id number]  HPI  Dylan Munoz is here for testosterone injection education. He has all the supplies he needs for the injection.   Review of Systems     Objective:   Physical Exam        Assessment & Plan:  Low testosterone - Patient was educated IM injection. He was able to give himself the testosterone injection. He voiced understanding. Advised patient to draw up 0.5 ml/1/2 on the syringe once a week, per the Rx.

## 2017-09-23 ENCOUNTER — Other Ambulatory Visit: Payer: Self-pay | Admitting: Family Medicine

## 2017-09-23 ENCOUNTER — Ambulatory Visit: Payer: BC Managed Care – PPO

## 2017-09-24 NOTE — Telephone Encounter (Signed)
Approved on 09/10/2017

## 2017-09-30 ENCOUNTER — Ambulatory Visit: Payer: BC Managed Care – PPO

## 2017-10-07 ENCOUNTER — Ambulatory Visit: Payer: BC Managed Care – PPO

## 2017-10-08 ENCOUNTER — Other Ambulatory Visit: Payer: Self-pay | Admitting: Family Medicine

## 2017-10-26 ENCOUNTER — Other Ambulatory Visit: Payer: Self-pay | Admitting: Family Medicine

## 2017-11-11 ENCOUNTER — Other Ambulatory Visit: Payer: Self-pay | Admitting: Family Medicine

## 2017-11-11 ENCOUNTER — Encounter: Payer: Self-pay | Admitting: Family Medicine

## 2017-11-11 DIAGNOSIS — E349 Endocrine disorder, unspecified: Secondary | ICD-10-CM

## 2017-11-12 ENCOUNTER — Other Ambulatory Visit: Payer: Self-pay

## 2017-11-12 ENCOUNTER — Encounter: Payer: Self-pay | Admitting: Family Medicine

## 2017-11-12 DIAGNOSIS — E349 Endocrine disorder, unspecified: Secondary | ICD-10-CM

## 2017-11-12 MED ORDER — ALCOHOL PREP PADS: MEDICATED_PAD | 99 refills | Status: AC

## 2017-11-12 MED ORDER — ALCOHOL PREP PADS: MEDICATED_PAD | 0 refills | Status: DC

## 2017-11-13 MED ORDER — VITAMIN D (ERGOCALCIFEROL) 1.25 MG (50000 UNIT) PO CAPS: 50000.0000 [IU] | ORAL_CAPSULE | ORAL | 0 refills | Status: DC

## 2017-11-29 ENCOUNTER — Emergency Department (HOSPITAL_BASED_OUTPATIENT_CLINIC_OR_DEPARTMENT_OTHER)
Admission: EM | Admit: 2017-11-29 | Discharge: 2017-11-29 | Disposition: A | Discharge status: Home / Self Care | Payer: BC Managed Care – PPO | Attending: Physician Assistant | Admitting: Physician Assistant

## 2017-11-29 ENCOUNTER — Encounter (HOSPITAL_BASED_OUTPATIENT_CLINIC_OR_DEPARTMENT_OTHER): Payer: Self-pay | Admitting: *Deleted

## 2017-11-29 ENCOUNTER — Other Ambulatory Visit: Payer: Self-pay

## 2017-11-29 DIAGNOSIS — I10 Essential (primary) hypertension: Secondary | ICD-10-CM | POA: Diagnosis not present

## 2017-11-29 DIAGNOSIS — Z79899 Other long term (current) drug therapy: Secondary | ICD-10-CM | POA: Diagnosis not present

## 2017-11-29 DIAGNOSIS — R252 Cramp and spasm: Secondary | ICD-10-CM | POA: Diagnosis not present

## 2017-11-29 LAB — CBC WITH DIFFERENTIAL/PLATELET
BASOS ABS: 0 10*3/uL (ref 0.0–0.1)
Basophils Relative: 0 %
EOS ABS: 0.1 10*3/uL (ref 0.0–0.7)
EOS PCT: 1 %
HCT: 43.9 % (ref 39.0–52.0)
Hemoglobin: 15.8 g/dL (ref 13.0–17.0)
Lymphocytes Relative: 22 %
Lymphs Abs: 1.7 10*3/uL (ref 0.7–4.0)
MCH: 35.7 pg — ABNORMAL HIGH (ref 26.0–34.0)
MCHC: 36 g/dL (ref 30.0–36.0)
MCV: 99.3 fL (ref 78.0–100.0)
MONO ABS: 0.7 10*3/uL (ref 0.1–1.0)
Monocytes Relative: 9 %
Neutro Abs: 5.6 10*3/uL (ref 1.7–7.7)
Neutrophils Relative %: 68 %
PLATELETS: 294 10*3/uL (ref 150–400)
RBC: 4.42 MIL/uL (ref 4.22–5.81)
RDW: 12.9 % (ref 11.5–15.5)
WBC: 8.1 10*3/uL (ref 4.0–10.5)

## 2017-11-29 LAB — COMPREHENSIVE METABOLIC PANEL
ALBUMIN: 4.4 g/dL (ref 3.5–5.0)
ALK PHOS: 83 U/L (ref 38–126)
ALT: 36 U/L (ref 17–63)
AST: 48 U/L — AB (ref 15–41)
Anion gap: 12 (ref 5–15)
BILIRUBIN TOTAL: 1.7 mg/dL — AB (ref 0.3–1.2)
BUN: 16 mg/dL (ref 6–20)
CALCIUM: 9 mg/dL (ref 8.9–10.3)
CO2: 26 mmol/L (ref 22–32)
CREATININE: 1.92 mg/dL — AB (ref 0.61–1.24)
Chloride: 96 mmol/L — ABNORMAL LOW (ref 101–111)
GFR calc Af Amer: 48 mL/min — ABNORMAL LOW (ref >60)
GFR, EST NON AFRICAN AMERICAN: 42 mL/min — AB (ref >60)
GLUCOSE: 100 mg/dL — AB (ref 65–99)
Potassium: 3.5 mmol/L (ref 3.5–5.1)
Sodium: 134 mmol/L — ABNORMAL LOW (ref 135–145)
TOTAL PROTEIN: 8 g/dL (ref 6.5–8.1)

## 2017-11-29 MED ORDER — CYCLOBENZAPRINE HCL 10 MG PO TABS: 10.0000 mg | ORAL_TABLET | Freq: Two times a day (BID) | ORAL | 0 refills | Status: DC | PRN

## 2017-11-29 MED ORDER — SODIUM CHLORIDE 0.9 % IV BOLUS (SEPSIS)
1000.0000 mL | Freq: Once | INTRAVENOUS | Status: AC
  Administered 2017-11-29: 1000 mL via INTRAVENOUS

## 2017-11-29 NOTE — ED Notes (Signed)
ED Provider at bedside. 

## 2017-11-29 NOTE — ED Notes (Signed)
Patient stated that he is cramping on his hands, fingers, neck , jaw since last night at 7pm  And at 10 pm, cramp hits his stomach, sides, back and foot.  He was taking muscle relaxant with no effect.

## 2017-11-29 NOTE — Discharge Instructions (Signed)
Is unclear what is causing yourcramping.  Please follow-up with your primary care physician as they may want to do an outpatient workup on your muscles.  Otherwise your labs look really reassuring here.  You can take the muscle relaxants to help but iin the care that it spasm the muscles that are causing the pain.

## 2017-11-29 NOTE — ED Provider Notes (Signed)
[facility] EMERGENCY DEPARTMENT Provider Note   CSN: [id number] Arrival date & time: 11/29/17  1536     History   Chief Complaint Chief Complaint  Patient presents with  . Generalized Body Aches    HPI Dylan CullensWendell Deyarmin Jr. is a 41 y.o. male.  HPI   Patient is 41 year old male with history of hypertension, morbid obesity, and history of cramping in his hands.  He is presenting today with cramping in his hands.  Patient reports his whole body is been cramping up.  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  He reports he thinks that he might be dehydrated.  Patient does drink 4-5 times a week.  And reports he has not had anything to drink since Friday.  Pt is very tremulous on arrival  Past Medical History:  Diagnosis Date  . High cholesterol   . Hypertension     Patient Active Problem List   Diagnosis Date Noted  . Cramping of hands 05/28/2016  . Hyperlipidemia 04/19/2016  . Testosterone deficiency 04/19/2016  . Vitamin D deficiency 04/19/2016  . HTN (hypertension) 04/17/2016  . Morbid obesity (HCC) 04/17/2016  . Sleep apnea 04/17/2016  . SOB (shortness of breath) on exertion 04/17/2016  . Left knee pain 04/17/2016  . Paresthesia and pain of both upper extremities 04/17/2016    Past Surgical History:  Procedure Laterality Date  . KNEE ARTHROSCOPY W/ ACL RECONSTRUCTION         Home Medications    Prior to Admission medications   Medication Sig Start Date End Date Taking? Authorizing Provider  Alcohol Swabs (ALCOHOL PREP) PADS Use to clean skin and vials for testosterone injections. 11/12/17   Rodolph Bongorey, Evan S, MD  atorvastatin (LIPITOR) 80 MG tablet TAKE 1 TABLET BY MOUTH EVERY DAY 10/08/17   Rodolph Bongorey, Evan S, MD  BD HYPODERMIC NEEDLE 18G X 1" MISC USE TO DRAW UP TESTOSTERONE WEEKLY 10/29/17   Rodolph Bongorey, Evan S, MD  cyclobenzaprine (FLEXERIL) 10 MG tablet Take 1 tablet (10 mg total) by mouth 2 (two) times daily as needed for muscle  spasms. 09/09/17   Rodolph Bongorey, Evan S, MD  lisinopril-hydrochlorothiazide (PRINZIDE,ZESTORETIC) 10-12.5 MG tablet TAKE 1 TABLET BY MOUTH EVERY DAY 10/08/17   Rodolph Bongorey, Evan S, MD  naproxen (NAPROSYN) 500 MG tablet TAKE 1 TABLET (500 MG TOTAL) BY MOUTH 2 (TWO) TIMES DAILY WITH A MEAL. 09/23/17   Rodolph Bongorey, Evan S, MD  naproxen sodium (ANAPROX) 550 MG tablet Take 1 tablet (550 mg total) by mouth 2 (two) times daily with a meal. 08/17/17   Gwyneth SproutPlunkett, Whitney, MD  NEEDLE, DISP, 18 G (B-D BLUNT FILL NEEDLE) 18G X 1-1/2" MISC Use to draw up testosterone weekly 09/09/17   Rodolph Bongorey, Evan S, MD  SYRINGE-NEEDLE, DISP, 3 ML (B-D 3CC LUER-LOK SYR 22GX1-1/2) 22G X 1-1/2" 3 ML MISC Use to administer testosterone weekly 09/09/17   Rodolph Bongorey, Evan S, MD  testosterone cypionate (DEPOTESTOSTERONE CYPIONATE) 200 MG/ML injection Inject 0.5 mLs (100 mg total) into the muscle once a week. 09/09/17   Rodolph Bongorey, Evan S, MD  Vitamin D, Ergocalciferol, (DRISDOL) 50000 units CAPS capsule Take 1 capsule (50,000 Units total) by mouth every 7 (seven) days. Take for 8 total doses(weeks) 11/13/17   Rodolph Bongorey, Evan S, MD    Family History Family History  Problem Relation Age of Onset  . Heart disease Father   . Diabetes Paternal Uncle     Social History Social History   Tobacco Use  .  Smoking status: Never Smoker  . Smokeless tobacco: Current User    Types: Chew  Substance Use Topics  . Alcohol use: Yes    Alcohol/week: 0.0 oz    Comment: weekly  . Drug use: No     Allergies   Patient has no known allergies.   Review of Systems Review of Systems  Constitutional: Negative for activity change.  HENT: Negative for congestion and dental problem.   Respiratory: Negative for shortness of breath.   Cardiovascular: Negative for chest pain.  Gastrointestinal: Negative for abdominal pain, diarrhea, nausea and vomiting.  Skin: Negative for rash.  Neurological: Positive for tremors.     Physical Exam Updated Vital Signs BP 121/82 (BP Location:  Left Arm)   Pulse (!) 128   Temp 98.1 F (36.7 C) (Oral)   Resp (!) 24   Ht 5\' 11"  (1.803 m)   Wt (!) 153.7 kg (338 lb 12.8 oz)   SpO2 98%   BMI 47.25 kg/m   Physical Exam  Constitutional: He is oriented to person, place, and time. He appears well-nourished.  Hands shaky  HENT:  Head: Normocephalic.  Right Ear: External ear normal.  Left Ear: External ear normal.  Eyes: Conjunctivae are normal. Right eye exhibits no discharge. Left eye exhibits no discharge.  Cardiovascular: Normal rate and regular rhythm.  Pulmonary/Chest: Effort normal and breath sounds normal. No stridor. No respiratory distress.  Abdominal: Soft. Bowel sounds are normal. He exhibits no distension. There is no tenderness.  Neurological: He is oriented to person, place, and time. No cranial nerve deficit. Coordination normal.  Skin: Skin is warm and dry. He is not diaphoretic.  Psychiatric: He has a normal mood and affect. His behavior is normal.     ED Treatments / Results  Labs (all labs ordered are listed, but only abnormal results are displayed) Labs Reviewed  COMPREHENSIVE METABOLIC PANEL  CBC WITH DIFFERENTIAL/PLATELET    EKG  EKG Interpretation None       Radiology No results found.  Procedures Procedures (including critical care time)  Medications Ordered in ED Medications  sodium chloride 0.9 % bolus 1,000 mL (not administered)     Initial Impression / Assessment and Plan / ED Course  I have reviewed the triage vital signs and the nursing notes.  Pertinent labs & imaging results that were available during my care of the patient were reviewed by me and considered in my medical decision making (see chart for details).      Patient is 41 year old male with history of hypertension, morbid obesity, and history of cramping in his hands.  He is presenting today with cramping in his hands.  Patient reports his whole body is been cramping up.  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  He reports he thinks that he might be dehydrated.  Patient does drink 4  times a week.  And reports he has not had anything to drink since Friday. But denies excess alcohol use.   Pt is tremulous on arrival. He keeps stating that he is really bad "nerves"   4:20 PM Will get biorad spectrum labs, give fluids.  Labs show mild bump in creatinine.  We will have patient given fluids.  Patient is taking oral fluids.  It is odd because it feels like patient is holding back some information howeverI double checked that he is not having any other symptoms except this "cramping all over".    We told patient  to follow-up with his primary care physician to get potential EMG and muscle studies or MRI for MS.  Otherwise his labs and physical exam and vital signs are reassuring.  Final Clinical Impressions(s) / ED Diagnoses   Final diagnoses:  None    ED Discharge Orders    None       Abelino DerrickMackuen, Yuri Flener Lyn, MD 11/29/17 2320

## 2017-11-29 NOTE — ED Triage Notes (Signed)
Generalized body cramping x 1 day.

## 2017-12-02 ENCOUNTER — Encounter: Payer: Self-pay | Admitting: Family Medicine

## 2017-12-21 ENCOUNTER — Encounter: Payer: Self-pay | Admitting: Family Medicine

## 2017-12-21 DIAGNOSIS — E349 Endocrine disorder, unspecified: Secondary | ICD-10-CM

## 2017-12-22 MED ORDER — TESTOSTERONE CYPIONATE 200 MG/ML IM SOLN: 100.0000 mg | INTRAMUSCULAR | 1 refills | Status: DC

## 2017-12-31 ENCOUNTER — Other Ambulatory Visit: Payer: Self-pay | Admitting: Family Medicine

## 2018-01-01 ENCOUNTER — Encounter: Payer: Self-pay | Admitting: Family Medicine

## 2018-01-06 ENCOUNTER — Other Ambulatory Visit: Payer: Self-pay | Admitting: Family Medicine

## 2018-01-19 ENCOUNTER — Ambulatory Visit (INDEPENDENT_AMBULATORY_CARE_PROVIDER_SITE_OTHER): Payer: BC Managed Care – PPO | Admitting: Family Medicine

## 2018-01-19 ENCOUNTER — Encounter: Payer: Self-pay | Admitting: Family Medicine

## 2018-01-19 VITALS — BP 154/99 | HR 102 | Ht 72.0 in | Wt 340.0 lb

## 2018-01-19 DIAGNOSIS — I10 Essential (primary) hypertension: Secondary | ICD-10-CM

## 2018-01-19 DIAGNOSIS — N179 Acute kidney failure, unspecified: Secondary | ICD-10-CM | POA: Diagnosis not present

## 2018-01-19 DIAGNOSIS — G4730 Sleep apnea, unspecified: Secondary | ICD-10-CM | POA: Diagnosis not present

## 2018-01-19 DIAGNOSIS — M25512 Pain in left shoulder: Secondary | ICD-10-CM | POA: Diagnosis not present

## 2018-01-19 LAB — COMPLETE METABOLIC PANEL WITH GFR
AG Ratio: 1.3 (calc) (ref 1.0–2.5)
ALBUMIN MSPROF: 4.3 g/dL (ref 3.6–5.1)
ALT: 37 U/L (ref 9–46)
AST: 38 U/L (ref 10–40)
Alkaline phosphatase (APISO): 73 U/L (ref 40–115)
BUN: 11 mg/dL (ref 7–25)
CALCIUM: 9.6 mg/dL (ref 8.6–10.3)
CO2: 30 mmol/L (ref 20–32)
CREATININE: 1.01 mg/dL (ref 0.60–1.35)
Chloride: 98 mmol/L (ref 98–110)
GFR, EST AFRICAN AMERICAN: 107 mL/min/{1.73_m2} (ref >=60)
GFR, EST NON AFRICAN AMERICAN: 92 mL/min/{1.73_m2} (ref >=60)
GLUCOSE: 100 mg/dL — AB (ref 65–99)
Globulin: 3.2 g/dL (calc) (ref 1.9–3.7)
Potassium: 3.9 mmol/L (ref 3.5–5.3)
Sodium: 138 mmol/L (ref 135–146)
TOTAL PROTEIN: 7.5 g/dL (ref 6.1–8.1)
Total Bilirubin: 0.9 mg/dL (ref 0.2–1.2)

## 2018-01-19 MED ORDER — METHOCARBAMOL 500 MG PO TABS: 500.0000 mg | ORAL_TABLET | Freq: Three times a day (TID) | ORAL | 0 refills | Status: DC

## 2018-01-19 MED ORDER — AMLODIPINE BESYLATE 10 MG PO TABS
10.0000 mg | ORAL_TABLET | Freq: Every day | ORAL | 0 refills | Status: DC
Start: 2018-01-19 — End: 2018-03-12

## 2018-01-19 NOTE — Patient Instructions (Addendum)
Thank you for coming in today. Get labs today.  START amlodipine.  Recheck in 4 weeks.  You should hear about CPAP machine soon.  Try the muscle relaxer and a heating pad and TENS unit.   TENS UNIT: This is helpful for muscle pain and spasm.   Search and Purchase a TENS 7000 2nd edition at  www.tenspros.com or www.Amazon.com It should be less than $30.     TENS unit instructions: Do not shower or bathe with the unit on Turn the unit off before removing electrodes or batteries If the electrodes lose stickiness add a drop of water to the electrodes after they are disconnected from the unit and place on plastic sheet. If you continued to have difficulty, call the TENS unit company to purchase more electrodes. Do not apply lotion on the skin area prior to use. Make sure the skin is clean and dry as this will help prolong the life of the electrodes. After use, always check skin for unusual red areas, rash or other skin difficulties. If there are any skin problems, does not apply electrodes to the same area. Never remove the electrodes from the unit by pulling the wires. Do not use the TENS unit or electrodes other than as directed. Do not change electrode placement without consultating your therapist or physician. Keep 2 fingers with between each electrode. Wear time ratio is 2:1, on to off times.    For example on for 30 minutes off for 15 minutes and then on for 30 minutes off for 15 minutes

## 2018-01-19 NOTE — Progress Notes (Signed)
Dylan CullensWendell Brummitt Jr. is a 42 y.o. male who [facility] today for follow up on ED visit.  Patient presented to ED 12/15 for muscle cramping. Given IV fluids and cramping largely resolved. He has not had issue with cramping since that time. However, on blood draw, ED did note bump in creatinine to 1.8 from baseline of approximately 1.0. No electrolyte abnormalities were noted. Otherwise, slight increase in bilirubin to 1.7 was only other abnormality. No other workup was performed at that time. Patient here for follow up labs.  HTN: patient with elevated BP in office x2 today. He is taking lisinopril-HCTZ for HTN at this point. Would benefit from better control. Patient without symptoms to current BP meds, but there is concern that his recent bump in creatinine could be secondary to the lisinopril.   Left shoulder pain: patient with 1 month history of left muscle pain in trapezius and periscapular area. Slightly limiting work and lifting weights. No specific movement recreates pain. He describes pain as achy in character and localized over scapula. No particular motion recreates pain. He has tried stretching, but has not seen improvement.    Past Medical History:  Diagnosis Date  . High cholesterol   . Hypertension    Past Surgical History:  Procedure Laterality Date  . KNEE ARTHROSCOPY W/ ACL RECONSTRUCTION     Social History   Tobacco Use  . Smoking status: Never Smoker  . Smokeless tobacco: Current User    Types: Chew  Substance Use Topics  . Alcohol use: Yes    Alcohol/week: 0.0 oz    Comment: weekly   family history includes Diabetes in his paternal uncle; Heart disease in his father.  ROS as above:  Medications: Current Outpatient Medications  Medication Sig Dispense Refill  . Alcohol Swabs (ALCOHOL PREP) PADS Use to clean skin and vials for  testosterone injections. 100 each prn  . atorvastatin (LIPITOR) 80 MG tablet TAKE 1 TABLET BY MOUTH EVERY DAY 30 tablet 3  . BD HYPODERMIC NEEDLE 18G X 1" MISC USE TO DRAW UP TESTOSTERONE WEEKLY 25 each prn  . lisinopril-hydrochlorothiazide (PRINZIDE,ZESTORETIC) 10-12.5 MG tablet TAKE 1 TABLET BY MOUTH EVERY DAY 30 tablet 3  . naproxen (NAPROSYN) 500 MG tablet TAKE 1 TABLET (500 MG TOTAL) BY MOUTH 2 (TWO) TIMES DAILY WITH A MEAL. 30 tablet 0  . naproxen sodium (ANAPROX) 550 MG tablet Take 1 tablet (550 mg total) by mouth 2 (two) times daily with a meal. 20 tablet 0  . NEEDLE, DISP, 18 G (B-D BLUNT FILL NEEDLE) 18G X 1-1/2" MISC Use to draw up testosterone weekly 50 each 0  . SYRINGE-NEEDLE, DISP, 3 ML (B-D 3CC LUER-LOK SYR 22GX1-1/2) 22G X 1-1/2" 3 ML MISC Use to administer testosterone weekly 50 each 0  . testosterone cypionate (DEPOTESTOSTERONE CYPIONATE) 200 MG/ML injection Inject 0.5 mLs (100 mg total) into the muscle once a week. Need follow up appointment soon 6 mL 1  . Vitamin D, Ergocalciferol, (DRISDOL) 50000 units CAPS capsule Take 1 capsule (50,000 Units total) by mouth every 7 (seven) days. Take for 8 total doses(weeks) 8 capsule 0  . amLODipine (NORVASC) 10 MG tablet Take 1 tablet (10 mg total) by mouth daily. 90 tablet 0  . methocarbamol (ROBAXIN) 500 MG tablet Take 1 tablet (500 mg total) by mouth 3 (three) times daily. 90 tablet 0   No current facility-administered medications for this visit.    No  Known Allergies  Health Maintenance Health Maintenance  Topic Date Due  . HIV Screening  04/03/1991  . TETANUS/TDAP  04/17/2026  . INFLUENZA VACCINE  Completed     Exam:  BP (!) 154/99   Pulse (!) 102   Ht 6' (1.829 m)   Wt (!) 340 lb (154.2 kg)   BMI 46.11 kg/m  Gen: Well NAD HEENT: EOMI,  MMM Lungs: Normal work of breathing. CTABL Heart: RRR no MRG Abd: NABS, Soft. Nondistended, Nontender Exts: Brisk capillary refill, warm and well perfused.  L. Shoulder: Normal in  appearance without effusion. Normal passive and active ROM. 5/5 strength throughout Neer's, crossover negative. Speeds, Yergeson's negative.   No results found for this or any previous visit (from the past 72 hour(s)). No results found.    Assessment and Plan: 42 y.o. male here for follow up of abnormal creatinine on ED visit in December. Likely transient increase in creatinine, but follow up labs are warranted. Will order CMP to follow up creatinine. Cramping, which elicited the ED visit, has resolved. He has not had an episode since that time. Electrolytes were fine in ED.  HTN: patient with elevated BP in office today. Will continue lisinopril-HCTZ and add amlodipine today. If creatinine comes back elevated today we will likely discontinue lisinopril.   OSA could be a cause of HTN.  The patient never got the sleep study previously. Will re-order sleep study  Shoulder pain is isolated to trapezius and periscapular muscles. Shoulder exam entirely benign, so rotator cuff muscles do not appear to be involved. Patient elected for home therapy over formal PT. Patient will benefit from heating pad, TINS unit, and muscle relaxants.   Orders Placed This Encounter  Procedures  . COMPLETE METABOLIC PANEL WITH GFR  . Home sleep test    Standing Status:   Future    Standing Expiration Date:   01/19/2019    Scheduling Instructions:     Sound sleep    Order Specific Question:   Where should this test be performed:    Answer:   Other   Meds ordered this encounter  Medications  . amLODipine (NORVASC) 10 MG tablet    Sig: Take 1 tablet (10 mg total) by mouth daily.    Dispense:  90 tablet    Refill:  0  . methocarbamol (ROBAXIN) 500 MG tablet    Sig: Take 1 tablet (500 mg total) by mouth 3 (three) times daily.    Dispense:  90 tablet    Refill:  0     Discussed warning signs or symptoms. Please see discharge instructions. Patient expresses understanding.

## 2018-01-27 ENCOUNTER — Other Ambulatory Visit: Payer: Self-pay | Admitting: Family Medicine

## 2018-02-16 ENCOUNTER — Ambulatory Visit: Payer: BC Managed Care – PPO | Admitting: Family Medicine

## 2018-02-17 ENCOUNTER — Other Ambulatory Visit: Payer: Self-pay | Admitting: Family Medicine

## 2018-02-22 ENCOUNTER — Other Ambulatory Visit: Payer: Self-pay | Admitting: Family Medicine

## 2018-03-09 ENCOUNTER — Ambulatory Visit: Payer: BC Managed Care – PPO | Admitting: Family Medicine

## 2018-03-12 ENCOUNTER — Ambulatory Visit: Payer: BC Managed Care – PPO | Admitting: Family Medicine

## 2018-03-12 ENCOUNTER — Other Ambulatory Visit: Payer: Self-pay | Admitting: Family Medicine

## 2018-03-12 ENCOUNTER — Encounter: Payer: Self-pay | Admitting: Family Medicine

## 2018-03-12 VITALS — BP 153/94 | HR 90 | Ht 72.0 in | Wt 332.0 lb

## 2018-03-12 DIAGNOSIS — G4730 Sleep apnea, unspecified: Secondary | ICD-10-CM | POA: Diagnosis not present

## 2018-03-12 DIAGNOSIS — I10 Essential (primary) hypertension: Secondary | ICD-10-CM

## 2018-03-12 DIAGNOSIS — M62838 Other muscle spasm: Secondary | ICD-10-CM

## 2018-03-12 DIAGNOSIS — E782 Mixed hyperlipidemia: Secondary | ICD-10-CM | POA: Diagnosis not present

## 2018-03-12 DIAGNOSIS — M7918 Myalgia, other site: Secondary | ICD-10-CM

## 2018-03-12 MED ORDER — LISINOPRIL-HYDROCHLOROTHIAZIDE 20-25 MG PO TABS: 1.0000 | ORAL_TABLET | Freq: Every day | ORAL | 1 refills | Status: DC

## 2018-03-12 MED ORDER — VITAMIN D (ERGOCALCIFEROL) 1.25 MG (50000 UNIT) PO CAPS: 50000.0000 [IU] | ORAL_CAPSULE | ORAL | 0 refills | Status: DC

## 2018-03-12 NOTE — Progress Notes (Signed)
Dylan Munoz. is a 42 y.o. male who presents to [name] today for HTN and shoulder and neck pain.   HTN: [name] notes continued elevated blood pressure.  When he does not check his blood pressure daily and denies chest pain or palpitations.  He does feel fatigued and notes that he snores.  I have suspected sleep apnea and have tried to obtain his home sleep study which has not yet been done.  The patient notes that he was never contacted.  Currently takes lisinopril/hydrochlorothiazide 10 x 12.5.  He was prescribed amlodipine but could not tolerate it due to leg swelling.  Additionally he notes continued left trapezius pain.  He notes pain in the left trapezius bilateral rhomboids and bilateral periscapular muscles.  He notes the pain is worse in the morning and with activity.  He works as a Financial risk analyst.  He denies any injury.  He had a trial of muscle relaxers and home management which did not sufficiently control his pain.  He is interested in physical therapy if possible.  Hyperlipidemia: Continuing to take Lipitor daily.  Muscle pains as noted above.   Past Medical History:  Diagnosis Date  . High cholesterol   . Hypertension    Past Surgical History:  Procedure Laterality Date  . KNEE ARTHROSCOPY W/ ACL RECONSTRUCTION     Social History   Tobacco Use  . Smoking status: Never Smoker  . Smokeless tobacco: Current User    Types: Chew  Substance Use Topics  . Alcohol use: Yes    Alcohol/week: 0.0 oz    Comment: weekly   family history includes Diabetes in his paternal uncle; Heart disease in his father.  ROS as above:  Medications: Current Outpatient Medications  Medication Sig Dispense Refill  . Alcohol Swabs (ALCOHOL PREP) PADS Use to clean skin and vials for testosterone injections. 100 each prn  . atorvastatin (LIPITOR) 80 MG tablet TAKE 1 TABLET  BY MOUTH EVERY DAY 30 tablet 3  . BD HYPODERMIC NEEDLE 18G X 1" MISC USE TO DRAW UP TESTOSTERONE WEEKLY 25 each prn  . NEEDLE, DISP, 18 G (B-D BLUNT FILL NEEDLE) 18G X 1-1/2" MISC Use to draw up testosterone weekly 50 each 0  . SYRINGE-NEEDLE, DISP, 3 ML (B-D 3CC LUER-LOK SYR 22GX1-1/2) 22G X 1-1/2" 3 ML MISC Use to administer testosterone weekly 50 each 0  . testosterone cypionate (DEPOTESTOSTERONE CYPIONATE) 200 MG/ML injection Inject 0.5 mLs (100 mg total) into the muscle once a week. Need follow up appointment soon 6 mL 1  . Vitamin D, Ergocalciferol, (DRISDOL) 50000 units CAPS capsule Take 1 capsule (50,000 Units total) by mouth every 7 (seven) days. Take for 8 total doses(weeks) 8 capsule 0  . lisinopril-hydrochlorothiazide (PRINZIDE,ZESTORETIC) 20-25 MG tablet Take 1 tablet by mouth daily. 90 tablet 1   No current facility-administered medications for this visit.    No Known Allergies  Health Maintenance Health Maintenance  Topic Date Due  . HIV Screening  03/13/2019 (Originally 04/03/1991)  . TETANUS/TDAP  04/17/2026  . INFLUENZA VACCINE  Completed     Exam:  BP (!) 153/94   Pulse 90   Ht 6' (1.829 m)   Wt (!) 332 lb (150.6 kg)   BMI 45.03 kg/m  Gen: Well NAD morbidly obese HEENT: EOMI,  MMM Lungs: Normal work of breathing. CTABL Heart: RRR no MRG Abd: NABS, Soft. Nondistended, Nontender Exts: Brisk capillary refill, warm and well perfused.  C-spine: Nontender to midline.  Normal neck motion.  Tender to palpation left trapezius. Upper extremity strength is intact throughout bilaterally.      Assessment and Plan: 42 y.o. male with  Hypertension: Not well controlled.  Plan to increase lisinopril/hydrochlorothiazide 20/25.  Discontinue amlodipine due to intolerance.  If not well controlled recheck in 1 month plan to add beta-blocker.   Possible sleep apnea: Likely.  Plan for home sleep study.  Order will be sent again today.  Trapezius pain: Likely myofascial  disruption and spasm.  Plan for physical therapy.  Consider statin intolerance as a possible explanation.  Vitamin D refilled.   Orders Placed This Encounter  Procedures  . Ambulatory referral to Physical Therapy    Referral Priority:   Routine    Referral Type:   Physical Medicine    Referral Reason:   Specialty Services Required    Requested Specialty:   Physical Therapy   Meds ordered this encounter  Medications  . lisinopril-hydrochlorothiazide (PRINZIDE,ZESTORETIC) 20-25 MG tablet    Sig: Take 1 tablet by mouth daily.    Dispense:  90 tablet    Refill:  1  . Vitamin D, Ergocalciferol, (DRISDOL) 50000 units CAPS capsule    Sig: Take 1 capsule (50,000 Units total) by mouth every 7 (seven) days. Take for 8 total doses(weeks)    Dispense:  8 capsule    Refill:  0     Discussed warning signs or symptoms. Please see discharge instructions. Patient expresses understanding.

## 2018-03-12 NOTE — Progress Notes (Signed)
Order changed so it can be sent.

## 2018-03-12 NOTE — Patient Instructions (Addendum)
Thank you for coming in today. Increase the lisinopril/HCTZ to 20/25. New pill sent.  Attend PT.  Recheck with me in 1 month.   You should hear about sleep study.  Let me know if you do not hear anything or it is too expensive.   If you have allergies try taking over the counter generic zyrtec or Claritin.

## 2018-03-19 ENCOUNTER — Ambulatory Visit: Payer: BC Managed Care – PPO | Admitting: Rehabilitative and Restorative Service Providers"

## 2018-03-26 ENCOUNTER — Encounter: Payer: Self-pay | Admitting: Family Medicine

## 2018-03-26 DIAGNOSIS — G4733 Obstructive sleep apnea (adult) (pediatric): Secondary | ICD-10-CM

## 2018-03-31 ENCOUNTER — Encounter: Payer: Self-pay | Admitting: Family Medicine

## 2018-04-07 ENCOUNTER — Telehealth: Payer: Self-pay | Admitting: Family Medicine

## 2018-04-07 DIAGNOSIS — G4733 Obstructive sleep apnea (adult) (pediatric): Secondary | ICD-10-CM

## 2018-04-07 MED ORDER — AMBULATORY NON FORMULARY MEDICATION: 0 refills | Status: DC

## 2018-04-07 NOTE — Telephone Encounter (Signed)
[facility]  CPAP ordered.

## 2018-04-13 ENCOUNTER — Ambulatory Visit: Payer: BC Managed Care – PPO

## 2018-04-14 ENCOUNTER — Ambulatory Visit: Payer: BC Managed Care – PPO | Admitting: Physical Therapy

## 2018-04-19 ENCOUNTER — Other Ambulatory Visit: Payer: Self-pay | Admitting: Family Medicine

## 2018-04-22 ENCOUNTER — Encounter (HOSPITAL_BASED_OUTPATIENT_CLINIC_OR_DEPARTMENT_OTHER): Payer: BC Managed Care – PPO

## 2018-04-26 ENCOUNTER — Encounter: Payer: Self-pay | Admitting: Family Medicine

## 2018-05-05 ENCOUNTER — Other Ambulatory Visit: Payer: Self-pay | Admitting: Family Medicine

## 2018-05-19 ENCOUNTER — Telehealth: Payer: Self-pay | Admitting: Family Medicine

## 2018-05-19 ENCOUNTER — Other Ambulatory Visit: Payer: Self-pay | Admitting: Family Medicine

## 2018-05-19 NOTE — Telephone Encounter (Signed)
Called patient left message for patient to call office regarding this. Rhonda Cunningham,CMA

## 2018-05-19 NOTE — Telephone Encounter (Signed)
CPAP set up letter received from [facility].  Will get 30 day report in early July.  Will need clinical follow up face to face visit between 06/15/18 and 08/13/18.  Will contact pt and ask for follow up appointment at some point after the 1st week of July.

## 2018-05-19 NOTE — Telephone Encounter (Signed)
Pt has finished his 8 week course. Do you want to refill this? Please advise

## 2018-05-26 ENCOUNTER — Other Ambulatory Visit: Payer: Self-pay | Admitting: Family Medicine

## 2018-06-13 ENCOUNTER — Other Ambulatory Visit: Payer: Self-pay | Admitting: Family Medicine

## 2018-06-13 DIAGNOSIS — I10 Essential (primary) hypertension: Secondary | ICD-10-CM

## 2018-06-13 DIAGNOSIS — E291 Testicular hypofunction: Secondary | ICD-10-CM

## 2018-06-13 DIAGNOSIS — E349 Endocrine disorder, unspecified: Secondary | ICD-10-CM

## 2018-06-13 DIAGNOSIS — E782 Mixed hyperlipidemia: Secondary | ICD-10-CM

## 2018-06-16 ENCOUNTER — Telehealth: Payer: Self-pay | Admitting: Family Medicine

## 2018-06-16 DIAGNOSIS — E349 Endocrine disorder, unspecified: Secondary | ICD-10-CM

## 2018-06-16 DIAGNOSIS — N179 Acute kidney failure, unspecified: Secondary | ICD-10-CM

## 2018-06-16 DIAGNOSIS — E782 Mixed hyperlipidemia: Secondary | ICD-10-CM

## 2018-06-16 DIAGNOSIS — I10 Essential (primary) hypertension: Secondary | ICD-10-CM

## 2018-06-16 NOTE — Telephone Encounter (Signed)
Labs ordered for testosterone follow-up.  Labs should be done fasting about 3 to 4 days after last testosterone shot.

## 2018-06-16 NOTE — Telephone Encounter (Signed)
Called patient and left detailed VM of MD instructions on labwork for testosterone. KG LPN

## 2018-06-23 ENCOUNTER — Other Ambulatory Visit: Payer: Self-pay | Admitting: Family Medicine

## 2018-06-26 ENCOUNTER — Encounter: Payer: Self-pay | Admitting: Family Medicine

## 2018-06-26 ENCOUNTER — Ambulatory Visit: Payer: Managed Care, Other (non HMO) | Admitting: Family Medicine

## 2018-06-26 VITALS — BP 115/77 | HR 105 | Ht 71.0 in | Wt 305.0 lb

## 2018-06-26 DIAGNOSIS — I10 Essential (primary) hypertension: Secondary | ICD-10-CM

## 2018-06-26 DIAGNOSIS — K5904 Chronic idiopathic constipation: Secondary | ICD-10-CM | POA: Diagnosis not present

## 2018-06-26 DIAGNOSIS — B360 Pityriasis versicolor: Secondary | ICD-10-CM

## 2018-06-26 DIAGNOSIS — K5900 Constipation, unspecified: Secondary | ICD-10-CM | POA: Insufficient documentation

## 2018-06-26 DIAGNOSIS — E349 Endocrine disorder, unspecified: Secondary | ICD-10-CM | POA: Diagnosis not present

## 2018-06-26 DIAGNOSIS — E782 Mixed hyperlipidemia: Secondary | ICD-10-CM | POA: Diagnosis not present

## 2018-06-26 DIAGNOSIS — G4733 Obstructive sleep apnea (adult) (pediatric): Secondary | ICD-10-CM

## 2018-06-26 MED ORDER — LINACLOTIDE 145 MCG PO CAPS: 145.0000 ug | ORAL_CAPSULE | Freq: Every day | ORAL | 1 refills | Status: DC

## 2018-06-26 MED ORDER — AMBULATORY NON FORMULARY MEDICATION: 0 refills | Status: DC

## 2018-06-26 MED ORDER — FLUCONAZOLE 150 MG PO TABS: 300.0000 mg | ORAL_TABLET | ORAL | 1 refills | Status: DC

## 2018-06-26 NOTE — Patient Instructions (Addendum)
Thank you for coming in today. Get fasting labs about 1 week after shot.  Shift the shot day to Friday.   I will get records from CPAP  Take the pills for the rash  Take linzess for constipation   Recheck in 3 months.   Return sooner if needed    If you know you will be sweating in the heat a lot take 1/2 pill of the lisinopril/HCTZ BP medicine.   Linaclotide oral capsules What is this medicine? LINACLOTIDE (lin a KLOE tide) is used to treat irritable bowel syndrome (IBS) with constipation as the main problem. It may also be used for relief of chronic constipation. This medicine may be used for other purposes; ask your health care provider or pharmacist if you have questions. COMMON BRAND NAME(S): Linzess What should I tell my health care provider before I take this medicine? They need to know if you have any of these conditions: -history of stool (fecal) impaction -now have diarrhea or have diarrhea often -other medical condition -stomach or intestinal disease, including bowel obstruction or abdominal adhesions -an unusual or allergic reaction to linaclotide, other medicines, foods, dyes, or preservatives -pregnant or trying to get pregnant -breast-feeding How should I use this medicine? Take this medicine by mouth with a glass of water. Follow the directions on the prescription label. Do not cut, crush or chew this medicine. Take on an empty stomach, at least 30 minutes before your first meal of the day. Take your medicine at regular intervals. Do not take your medicine more often than directed. Do not stop taking except on your doctor's advice. A special MedGuide will be given to you by the pharmacist with each prescription and refill. Be sure to read this information carefully each time. Talk to your pediatrician regarding the use of this medicine in children. This medicine is not approved for use in children. Overdosage: If you think you have taken too much of this medicine  contact a poison control center or emergency room at once. NOTE: This medicine is only for you. Do not share this medicine with others. What if I miss a dose? If you miss a dose, just skip that dose. Wait until your next dose, and take only that dose. Do not take double or extra doses. What may interact with this medicine? -certain medicines for bowel problems or bladder incontinence (these can cause constipation) This list may not describe all possible interactions. Give your health care provider a list of all the medicines, herbs, non-prescription drugs, or dietary supplements you use. Also tell them if you smoke, drink alcohol, or use illegal drugs. Some items may interact with your medicine. What should I watch for while using this medicine? Visit your doctor for regular check ups. Tell your doctor if your symptoms do not get better or if they get worse. Diarrhea is a common side effect of this medicine. It often begins within 2 weeks of starting this medicine. Stop taking this medicine and call your doctor if you get severe diarrhea. Stop taking this medicine and call your doctor or go to the nearest hospital emergency room right away if you develop unusual or severe stomach-area (abdominal) pain, especially if you also have bright red, bloody stools or black stools that look like tar. What side effects may I notice from receiving this medicine? Side effects that you should report to your doctor or health care professional as soon as possible: -allergic reactions like skin rash, itching or hives, swelling of the  face, lips, or tongue -black, tarry stools -bloody or watery diarrhea -new or worsening stomach pain -severe or prolonged diarrhea Side effects that usually do not require medical attention (report to your doctor or health care professional if they continue or are bothersome): -bloating -gas -loose stools This list may not describe all possible side effects. Call your doctor for  medical advice about side effects. You may report side effects to FDA at 1-800-FDA-1088. Where should I keep my medicine? Keep out of the reach of children. Store at room temperature between 20 and 25 degrees C (68 and 77 degrees F). Keep this medicine in the original container. Keep tightly closed in a dry place. Do not remove the desiccant packet from the bottle, it helps to protect your medicine from moisture. Throw away any unused medicine after the expiration date. NOTE: This sheet is a summary. It may not cover all possible information. If you have questions about this medicine, talk to your doctor, pharmacist, or health care provider.  2018 Elsevier/Gold Standard (2016-01-04 12:17:04)   Tinea Versicolor Tinea versicolor is a common fungal infection of the skin. It causes a rash that appears as light or dark patches on the skin. The rash most often occurs on the chest, back, neck, or upper arms. This condition is more common during warm weather. Other than affecting how your skin looks, tinea versicolor usually does not cause other problems. In most cases, the infection goes away in a few weeks with treatment. It may take a few months for the patches on your skin to clear up. What are the causes? Tinea versicolor occurs when a type of fungus that is normally present on the skin starts to overgrow. This fungus is a kind of yeast. The exact cause of the overgrowth is not known. This condition cannot be passed from one person to another (noncontagious). What increases the risk? This condition is more likely to develop when certain factors are present, such as:  Heat and humidity.  Sweating too much.  Hormone changes.  Oily skin.  A weak defense (immune) system.  What are the signs or symptoms? Symptoms of this condition may include:  A rash on your skin that is made up of light or dark patches. The rash may have: ? Patches of tan or pink spots on light skin. ? Patches of white or  brown spots on dark skin. ? Patches of skin that do not tan. ? Well-marked edges. ? Scales on the discolored areas.  Mild itching.  How is this diagnosed? A health care provider can usually diagnose this condition by looking at your skin. During the exam, he or she may use ultraviolet light to help determine the extent of the infection. In some cases, a skin sample may be taken by scraping the rash. This sample will be viewed under a microscope to check for yeast overgrowth. How is this treated? Treatment for this condition may include:  Dandruff shampoo that is applied to the affected skin during showers or bathing.  Over-the-counter medicated skin cream, lotion, or soaps.  Prescription antifungal medicine in the form of skin cream or pills.  Medicine to help reduce itching.  Follow these instructions at home:  Take medicines only as directed by your health care provider.  Apply dandruff shampoo to the affected area if told to do so by your health care provider. You may be instructed to scrub the affected skin for several minutes each day.  Do not scratch the affected area of  skin.  Avoid hot and humid conditions.  Do not use tanning booths.  Try to avoid sweating a lot. Contact a health care provider if:  Your symptoms get worse.  You have a fever.  You have redness, swelling, or pain at the site of your rash.  You have fluid, blood, or pus coming from your rash.  Your rash returns after treatment. This information is not intended to replace advice given to you by your health care provider. Make sure you discuss any questions you have with your health care provider. Document Released: 11/29/2000 Document Revised: 08/04/2016 Document Reviewed: 09/13/2014 Elsevier Interactive Patient Education  2018 [employer].

## 2018-06-26 NOTE — Progress Notes (Addendum)
Dylan Munoz. is a 42 y.o. male who presents to [name]: Primary Care Sports Medicine today for CPAP follow-up as well as discuss hypertension, rash, constipation.  Javares has been using his CPAP regularly but notes that it is uncomfortable.  He notes that it tends to fall off at night and he thinks the pressure is set too low.  He is tried adjusting the mass with the home health agency but the mask is not quite right.  30-day report not available at the time of today's note.  He uses the CPAP most nights any does feel a bit better.  He feels a bit more rested.  Rash: Messiyah notes a area of macular hypopigmentation on his left forearm.  He notes it slightly silvery.  Its been present now for about 2 months.  He notes is not painful or particularly itchy.  He denies any other similar rash or new symptoms detergents or shampoo.  Hypertension: Patient takes medications listed below.  No chest pain palpitations shortness of breath.  He tolerates medication well.  He notes on very hot days when he has been sweating a lot he occasionally has some lightheadedness.  He tries to drink lots of water during those days but is not sure if he is getting enough.  Testosterone: Patient notes that he is receiving testosterone injection.  He tolerates it well and notes increased libido.  Additionally patient notes constipation.  He is having hard painful stools approximately every other day with episodes of increased watery stool.  He has been using MiraLAX Colace and Ex-Lax which helps a little.  He would like to try other medications if possible.  He has tried increasing his fiber in his diet which is helped a bit.  ROS as above:  Exam:  BP 115/77   Pulse (!) 105   Ht 5\' 11"  (1.803 m)   Wt (!) 305 lb (138.3 kg)   BMI 42.54 kg/m  Gen: Well NAD HEENT: EOMI,  MMM Lungs: Normal work of breathing.  CTABL Heart: RRR no MRG Abd: NABS, Soft. Nondistended, Nontender Exts: Brisk capillary refill, warm and well perfused.  Skin: Macular hypopigmentation areas left forearm with slight scale.  Nontender.    Assessment and Plan: 42 y.o. male with  CPAP: Patient is using it and having benefit.  Awaiting 30-day compliance report.  Will update this note with addendum on report is available.  Recommend patient contact his home health agency and continue to have the mask adjusted.  Recheck 3 months.  Addendum: 30-day compliance report received Usage days 30/30 Greater than 4 hours 25 days Average usage 6 hours 41 minutes per night 95th percentile 12.8 maximum 14.4 cm h2o  Rash: Likely tinea versicolor.  Differential includes tinea corporis or contact dermatitis.  Trial of oral fluconazole 300 mg once weekly for 2 weeks.  If not improving next step would be trial of oral terbinafine for tinea corporis as well as potential topical steroids for contact dermatitis.  Hypertension: Blood pressure well controlled.  I am suspicious that he is becoming hypotensive on very hot days with lots of sweating.  On those days that he anticipates lots of sweating and heat recommend taking half of the lisinopril hydrochlorothiazide pill.  Check basic fasting labs.  Recheck 3 months.  Constipation: Likely idiopathic possible IBS constipation type.  Patient feeling typical conservative management.  Plan to prescribe Linzess and recheck in about 3 months.  Basic fasting labs for  testosterone follow-up as well as hyperlipidemia follow-up ordered and should be done 1 week after last testosterone injection.        No orders of the defined types were placed in this encounter.  Meds ordered this encounter  Medications  . linaclotide (LINZESS) 145 MCG CAPS capsule    Sig: Take 1 capsule (145 mcg total) by mouth daily before breakfast.    Dispense:  90 capsule    Refill:  1    Windell has tried Miralax and Exlax  and colace  . fluconazole (DIFLUCAN) 150 MG tablet    Sig: Take 2 tablets (300 mg total) by mouth once a week.    Dispense:  4 tablet    Refill:  1  . AMBULATORY NON FORMULARY MEDICATION    Sig: Please provide 30 day download of CPAP Fax to Dr Denyse Amassorey [phone]    Dispense:  1 each    Refill:  0     Historical information moved to improve visibility of documentation.  Past Medical History:  Diagnosis Date  . High cholesterol   . Hypertension   . Morbid obesity (HCC) 04/17/2016  . Severe obstructive sleep apnea 04/17/2016   AHI 14.9 with desats <88% for >90 mins on SNAP report 04/02/18. See scanned document.  . Testosterone deficiency 04/19/2016  . Vitamin D deficiency 04/19/2016   Past Surgical History:  Procedure Laterality Date  . KNEE ARTHROSCOPY W/ ACL RECONSTRUCTION     Social History   Tobacco Use  . Smoking status: Never Smoker  . Smokeless tobacco: Current User    Types: Chew  Substance Use Topics  . Alcohol use: Yes    Alcohol/week: 0.0 oz    Comment: weekly   family history includes Diabetes in his paternal uncle; Heart disease in his father.  Medications: Current Outpatient Medications  Medication Sig Dispense Refill  . Alcohol Swabs (ALCOHOL PREP) PADS Use to clean skin and vials for testosterone injections. 100 each prn  . AMBULATORY NON FORMULARY MEDICATION Please provide 30 day download of CPAP Fax to Dr Denyse Amassorey [phone] 1 each 0  . amLODipine (NORVASC) 10 MG tablet TAKE 1 TABLET (10 MG TOTAL) BY MOUTH DAILY. APPOINTMENT NEEDED FOR FURTHER REFILLS 30 tablet 0  . atorvastatin (LIPITOR) 80 MG tablet TAKE 1 TABLET BY MOUTH EVERY DAY 30 tablet 3  . BD HYPODERMIC NEEDLE 18G X 1" MISC USE TO DRAW UP TESTOSTERONE WEEKLY 25 each prn  . lisinopril-hydrochlorothiazide (PRINZIDE,ZESTORETIC) 20-25 MG tablet Take 1 tablet by mouth daily. 90 tablet 1  . NEEDLE, DISP, 18 G (B-D BLUNT FILL NEEDLE) 18G X 1-1/2" MISC Use to draw up testosterone weekly 50 each 0  .  SYRINGE-NEEDLE, DISP, 3 ML (B-D 3CC LUER-LOK SYR 22GX1-1/2) 22G X 1-1/2" 3 ML MISC Use to administer testosterone weekly 50 each 0  . testosterone cypionate (DEPOTESTOSTERONE CYPIONATE) 200 MG/ML injection Inject 0.5 mLs (100 mg total) into the muscle once a week. Need labs and follow up appointment 4 mL 0  . Vitamin D, Ergocalciferol, (DRISDOL) 50000 units CAPS capsule TAKE 1 CAPSULE (50,000 UNITS TOTAL) BY MOUTH EVERY 7 (SEVEN) DAYS. TAKE FOR 8 TOTAL WEEKS 8 capsule 0  . fluconazole (DIFLUCAN) 150 MG tablet Take 2 tablets (300 mg total) by mouth once a week. 4 tablet 1  . linaclotide (LINZESS) 145 MCG CAPS capsule Take 1 capsule (145 mcg total) by mouth daily before breakfast. 90 capsule 1   No current facility-administered medications for this visit.    No Known Allergies  Discussed warning signs or symptoms. Please see discharge instructions. Patient expresses understanding.

## 2018-07-01 MED ORDER — AMBULATORY NON FORMULARY MEDICATION: 0 refills | Status: DC

## 2018-07-01 NOTE — Addendum Note (Signed)
Addended by: Rodolph BongOREY, Rivaan Kendall S on: 07/01/2018 12:18 PM   Modules accepted: Orders

## 2018-07-27 ENCOUNTER — Other Ambulatory Visit: Payer: Self-pay | Admitting: Family Medicine

## 2018-07-27 DIAGNOSIS — E349 Endocrine disorder, unspecified: Secondary | ICD-10-CM

## 2018-07-31 ENCOUNTER — Other Ambulatory Visit: Payer: Self-pay | Admitting: Family Medicine

## 2018-07-31 DIAGNOSIS — E349 Endocrine disorder, unspecified: Secondary | ICD-10-CM

## 2018-08-08 LAB — LIPID PANEL W/REFLEX DIRECT LDL
CHOL/HDL RATIO: 5.4 (calc) — AB (ref <5.0)
CHOLESTEROL: 157 mg/dL (ref <200)
HDL: 29 mg/dL — AB (ref >40)
LDL CHOLESTEROL (CALC): 108 mg/dL — AB
Non-HDL Cholesterol (Calc): 128 mg/dL (calc) (ref <130)
Triglycerides: 105 mg/dL (ref <150)

## 2018-08-08 LAB — PSA: PSA: 0.8 ng/mL (ref <=4.0)

## 2018-08-08 LAB — COMPLETE METABOLIC PANEL WITH GFR
AG Ratio: 1.3 (calc) (ref 1.0–2.5)
ALKALINE PHOSPHATASE (APISO): 68 U/L (ref 40–115)
ALT: 22 U/L (ref 9–46)
AST: 31 U/L (ref 10–40)
Albumin: 3.9 g/dL (ref 3.6–5.1)
BUN: 9 mg/dL (ref 7–25)
CO2: 31 mmol/L (ref 20–32)
CREATININE: 1 mg/dL (ref 0.60–1.35)
Calcium: 9.1 mg/dL (ref 8.6–10.3)
Chloride: 95 mmol/L — ABNORMAL LOW (ref 98–110)
GFR, Est African American: 107 mL/min/{1.73_m2} (ref >=60)
GFR, Est Non African American: 92 mL/min/{1.73_m2} (ref >=60)
Globulin: 3 g/dL (calc) (ref 1.9–3.7)
Glucose, Bld: 83 mg/dL (ref 65–99)
Potassium: 3.4 mmol/L — ABNORMAL LOW (ref 3.5–5.3)
SODIUM: 139 mmol/L (ref 135–146)
Total Bilirubin: 1.1 mg/dL (ref 0.2–1.2)
Total Protein: 6.9 g/dL (ref 6.1–8.1)

## 2018-08-08 LAB — CBC
HCT: 36.7 % — ABNORMAL LOW (ref 38.5–50.0)
Hemoglobin: 13.1 g/dL — ABNORMAL LOW (ref 13.2–17.1)
MCH: 35.2 pg — ABNORMAL HIGH (ref 27.0–33.0)
MCHC: 35.7 g/dL (ref 32.0–36.0)
MCV: 98.7 fL (ref 80.0–100.0)
MPV: 9.4 fL (ref 7.5–12.5)
Platelets: 382 10*3/uL (ref 140–400)
RBC: 3.72 10*6/uL — ABNORMAL LOW (ref 4.20–5.80)
RDW: 17.1 % — AB (ref 11.0–15.0)
WBC: 4.8 10*3/uL (ref 3.8–10.8)

## 2018-08-08 LAB — TESTOSTERONE: Testosterone: 510 ng/dL (ref 250–827)

## 2018-09-01 ENCOUNTER — Other Ambulatory Visit: Payer: Self-pay

## 2018-09-01 MED ORDER — LISINOPRIL-HYDROCHLOROTHIAZIDE 20-25 MG PO TABS: 1.0000 | ORAL_TABLET | Freq: Every day | ORAL | 1 refills | Status: DC

## 2018-09-16 ENCOUNTER — Other Ambulatory Visit: Payer: Self-pay | Admitting: Family Medicine

## 2018-09-25 ENCOUNTER — Encounter: Payer: Self-pay | Admitting: Family Medicine

## 2018-09-25 ENCOUNTER — Ambulatory Visit (INDEPENDENT_AMBULATORY_CARE_PROVIDER_SITE_OTHER): Payer: Managed Care, Other (non HMO)

## 2018-09-25 ENCOUNTER — Ambulatory Visit (INDEPENDENT_AMBULATORY_CARE_PROVIDER_SITE_OTHER): Payer: Managed Care, Other (non HMO) | Admitting: Family Medicine

## 2018-09-25 VITALS — BP 143/87 | HR 91 | Ht 71.0 in | Wt 302.0 lb

## 2018-09-25 DIAGNOSIS — M50122 Cervical disc disorder at C5-C6 level with radiculopathy: Secondary | ICD-10-CM

## 2018-09-25 DIAGNOSIS — K5904 Chronic idiopathic constipation: Secondary | ICD-10-CM | POA: Diagnosis not present

## 2018-09-25 DIAGNOSIS — Z23 Encounter for immunization: Secondary | ICD-10-CM | POA: Diagnosis not present

## 2018-09-25 DIAGNOSIS — D649 Anemia, unspecified: Secondary | ICD-10-CM

## 2018-09-25 DIAGNOSIS — G4733 Obstructive sleep apnea (adult) (pediatric): Secondary | ICD-10-CM | POA: Diagnosis not present

## 2018-09-25 DIAGNOSIS — D539 Nutritional anemia, unspecified: Secondary | ICD-10-CM

## 2018-09-25 DIAGNOSIS — M5412 Radiculopathy, cervical region: Secondary | ICD-10-CM

## 2018-09-25 MED ORDER — LINACLOTIDE 72 MCG PO CAPS: 72.0000 ug | ORAL_CAPSULE | Freq: Every day | ORAL | 1 refills | Status: DC

## 2018-09-25 MED ORDER — FLUCONAZOLE 150 MG PO TABS: 300.0000 mg | ORAL_TABLET | ORAL | 1 refills | Status: DC

## 2018-09-25 MED ORDER — GABAPENTIN 300 MG PO CAPS: ORAL_CAPSULE | ORAL | 3 refills | Status: DC

## 2018-09-25 MED ORDER — PREDNISONE 50 MG PO TABS: 50.0000 mg | ORAL_TABLET | Freq: Every day | ORAL | 0 refills | Status: DC

## 2018-09-25 NOTE — Patient Instructions (Addendum)
Thank you for coming in today. Take the fluconazole for rash.  Return the stool cards.  Recheck iron levels today.   Next step is MRI.  Get xray now.      Cervical Radiculopathy Cervical radiculopathy happens when a nerve in the neck (cervical nerve) is pinched or bruised. This condition can develop because of an injury or as part of the normal aging process. Pressure on the cervical nerves can cause pain or numbness that runs from the neck all the way down into the arm and fingers. Usually, this condition gets better with rest. Treatment may be needed if the condition does not improve. What are the causes? This condition may be caused by:  Injury.  Slipped (herniated) disk.  Muscle tightness in the neck because of overuse.  Arthritis.  Breakdown or degeneration in the bones and joints of the spine (spondylosis) due to aging.  Bone spurs that may develop near the cervical nerves.  What are the signs or symptoms? Symptoms of this condition include:  Pain that runs from the neck to the arm and hand. The pain can be severe or irritating. It may be worse when the neck is moved.  Numbness or weakness in the affected arm and hand.  How is this diagnosed? This condition may be diagnosed based on symptoms, medical history, and a physical exam. You may also have tests, including:  X-rays.  CT scan.  MRI.  Electromyogram (EMG).  Nerve conduction tests.  How is this treated? In many cases, treatment is not needed for this condition. With rest, the condition usually gets better over time. If treatment is needed, options may include:  Wearing a soft neck collar for short periods of time.  Physical therapy to strengthen your neck muscles.  Medicines, such as NSAIDs, oral corticosteroids, or spinal injections.  Surgery. This may be needed if other treatments do not help. Various types of surgery may be done depending on the cause of your problems.  Follow these  instructions at home: Managing pain  Take over-the-counter and prescription medicines only as told by your health care provider.  If directed, apply ice to the affected area. ? Put ice in a plastic bag. ? Place a towel between your skin and the bag. ? Leave the ice on for 20 minutes, 2-3 times per day.  If ice does not help, you can try using heat. Take a warm shower or warm bath, or use a heat pack as told by your health care provider.  Try a gentle neck and shoulder massage to help relieve symptoms. Activity  Rest as needed. Follow instructions from your health care provider about any restrictions on activities.  Do stretching and strengthening exercises as told by your health care provider or physical therapist. General instructions  If you were given a soft collar, wear it as told by your health care provider.  Use a flat pillow when you sleep.  Keep all follow-up visits as told by your health care provider. This is important. Contact a health care provider if:  Your condition does not improve with treatment. Get help right away if:  Your pain gets much worse and cannot be controlled with medicines.  You have weakness or numbness in your hand, arm, face, or leg.  You have a high fever.  You have a stiff, rigid neck.  You lose control of your bowels or your bladder (have incontinence).  You have trouble with walking, balance, or speaking. This information is not intended to  replace advice given to you by your health care provider. Make sure you discuss any questions you have with your health care provider. Document Released: 08/27/2001 Document Revised: 05/09/2016 Document Reviewed: 01/26/2015 Elsevier Interactive Patient Education  [employer].

## 2018-09-25 NOTE — Progress Notes (Signed)
Dylan Munoz. is a 42 y.o. male who presents to [facility]: Primary Care Sports Medicine today for left shoulder pain, constipation, sleep apnea, anemia.  Dylan Munoz notes a several month history of pain in the left lateral neck to the shoulder radiating to the left arm.  He notes pain occasionally radiates to the hand.  He describes a tingling burning pain.  He notes the pain is worse with neck motion.  He notes the pain is not much changed by shoulder motion.  He denies any injury or weakness.  He is been doing some home exercise program over the last several weeks with little benefit.  He denies any weakness or significant numbness distally.  No fevers or chills nausea vomiting or diarrhea.  Additionally he notes continued constipation.  This has been pretty well controlled with Linzess 145 mcg.  He does note however he will sometimes have diarrhea with the 145 mcg dose.  He does note that it is helpful.  He denies any blood in the stool or black tarry stool.  About 3 months ago he was seen and was found to have mildly low hemoglobin at 13.  He feels well with no issues fatigue lightheadedness or dizziness.  He notes that he uses his CPAP regularly for sleep apnea.  He tolerates it well and feels rested when he uses it.   ROS as above:  Exam:  BP (!) 143/87   Pulse 91   Ht 5\' 11"  (1.803 m)   Wt (!) 302 lb (137 kg)   BMI 42.12 kg/m  Wt Readings from Last 5 Encounters:  09/25/18 (!) 302 lb (137 kg)  06/26/18 (!) 305 lb (138.3 kg)  03/12/18 (!) 332 lb (150.6 kg)  01/19/18 (!) 340 lb (154.2 kg)  11/29/17 (!) 338 lb 12.8 oz (153.7 kg)    Gen: Well NAD HEENT: EOMI,  MMM Lungs: Normal work of breathing. CTABL Heart: RRR no MRG Abd: NABS, Soft. Nondistended, Nontender Exts: Brisk capillary refill, warm and well perfused.  C-spine nontender to midline tender palpation left cervical paraspinal  muscle group and left trapezius. Normal neck motion. Positive left-sided Spurling's test. Upper extremity strength is equal and normal throughout. Reflexes are equal and normal bilaterally. Pulses capillary refill and sensation are intact distally.  Left shoulder: Normal-appearing nontender normal shoulder motion.  Negative impingement testing normal strength.   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  The degenerative changes are most pronounced at C5-6, and C6-7. No acute fractures are present Await formal radiology review   Assessment and Plan: 42 y.o. male with  Left neck and arm pain is likely cervical radiculopathy.  It is difficult to quantify which dermatome is present as the patient is not exactly sure where the numbness goes into his hand.  Given the diffuse degenerative changes present in his cervical spine it could be anything from C5 to C8 nerve root.  However it is reasonable at this point to proceed with trial of conservative management including home exercise program, and prednisone course as well as gabapentin.  If not improving next step would be MRI for injection planning.  Recheck in 5 weeks or so.  Anemia: Unclear etiology.  Plan for recheck CBC along with iron store's.  We will go ahead and do a stool Hemoccult test x3 at home to evaluate for possible gut cause of blood loss.  CPAP: Doing well continue current regimen.  Constipation: Slight diarrhea with Linzess 145.  Will decrease down to 72 mcg.  Recheck in 5 weeks as scheduled.  Flu vaccine given today prior to discharge.   Orders Placed This Encounter  Procedures  . DG Cervical Spine Complete    Standing Status:   Future      Number of Occurrences:   1    Standing Expiration Date:   11/26/2019    Order Specific Question:   Reason for Exam (SYMPTOM  OR DIAGNOSIS REQUIRED)    Answer:   eval cspine pain and ?left arm rad    Order Specific Question:   Preferred imaging location?    Answer:   [facility]    Order Specific Question:   Radiology Contrast Protocol - do NOT remove file path    Answer:   [email or web address]  . Flu Vaccine QUAD 36+ mos IM  . CBC  . Fe+TIBC+Fer   Meds ordered this encounter  Medications  . linaclotide (LINZESS) 72 MCG capsule    Sig: Take 1 capsule (72 mcg total) by mouth daily before breakfast.    Dispense:  90 capsule    Refill:  1    Dylan Munoz has tried Miralax and Exlax and colace  . fluconazole (DIFLUCAN) 150 MG tablet    Sig: Take 2 tablets (300 mg total) by mouth once a week.    Dispense:  8 tablet    Refill:  1  . predniSONE (DELTASONE) 50 MG tablet    Sig: Take 1 tablet (50 mg total) by mouth daily.    Dispense:  5 tablet    Refill:  0  . gabapentin (NEURONTIN) 300 MG capsule    Sig: One tab PO qHS for a week, then BID for a week, then TID. May double weekly to a max of 3,600mg /day    Dispense:  180 capsule    Refill:  3     Historical information moved to improve visibility of documentation.  Past Medical History:  Diagnosis Date  . High cholesterol   . Hypertension   . Morbid obesity (HCC) 04/17/2016  . Severe obstructive sleep apnea 04/17/2016   AHI 14.9 with desats <88% for >90 mins on SNAP report 04/02/18. See scanned document.  . Testosterone deficiency 04/19/2016  . Vitamin D deficiency 04/19/2016   Past Surgical History:  Procedure Laterality Date  . KNEE ARTHROSCOPY W/ ACL RECONSTRUCTION     Social History   Tobacco Use  . Smoking status: Never Smoker  . Smokeless tobacco: Current User    Types: Chew  Substance Use Topics  . Alcohol use: Yes    Alcohol/week: 0.0 standard drinks    Comment: weekly    family history includes Diabetes in his paternal uncle; Heart disease in his father.  Medications: Current Outpatient Medications  Medication Sig Dispense Refill  . Alcohol Swabs (ALCOHOL PREP) PADS Use to clean skin and vials for testosterone injections. 100 each prn  . AMBULATORY NON FORMULARY MEDICATION Please set CPAP to 13 cm of water.  15-minute ramp.  Adjust mask fit.  Face-to-face note will be attached as well. 1 each 0  . amLODipine (NORVASC) 10 MG tablet TAKE 1 TABLET (10 MG TOTAL) BY MOUTH DAILY. APPOINTMENT NEEDED FOR FURTHER REFILLS 30 tablet  0  . atorvastatin (LIPITOR) 80 MG tablet TAKE 1 TABLET BY MOUTH EVERY DAY 90 tablet 1  . BD HYPODERMIC NEEDLE 18G X 1" MISC USE TO DRAW UP TESTOSTERONE WEEKLY 25 each prn  . linaclotide (LINZESS) 72 MCG capsule Take 1 capsule (72 mcg total) by mouth daily before breakfast. 90 capsule 1  . lisinopril-hydrochlorothiazide (PRINZIDE,ZESTORETIC) 20-25 MG tablet Take 1 tablet by mouth daily. 90 tablet 1  . NEEDLE, DISP, 18 G (B-D BLUNT FILL NEEDLE) 18G X 1-1/2" MISC Use to draw up testosterone weekly 50 each 0  . SYRINGE-NEEDLE, DISP, 3 ML (B-D 3CC LUER-LOK SYR 22GX1-1/2) 22G X 1-1/2" 3 ML MISC USE TO ADMINISTER TESTOSTERONE WEEKLY 50 each 1  . testosterone cypionate (DEPOTESTOSTERONE CYPIONATE) 200 MG/ML injection INJECT 0.5 MLS (100 MG TOTAL) INTO THE MUSCLE ONCE A WEEK. NEED LABS AND FOLLOW UP APPOINTMENT 4 mL 0  . Vitamin D, Ergocalciferol, (DRISDOL) 50000 units CAPS capsule TAKE 1 CAPSULE (50,000 UNITS TOTAL) BY MOUTH EVERY 7 (SEVEN) DAYS. TAKE FOR 8 TOTAL WEEKS 8 capsule 0  . fluconazole (DIFLUCAN) 150 MG tablet Take 2 tablets (300 mg total) by mouth once a week. 8 tablet 1  . gabapentin (NEURONTIN) 300 MG capsule One tab PO qHS for a week, then BID for a week, then TID. May double weekly to a max of 3,600mg /day 180 capsule 3  . predniSONE (DELTASONE) 50 MG tablet Take 1 tablet (50 mg total) by mouth daily. 5 tablet 0   No current  facility-administered medications for this visit.    No Known Allergies   Discussed warning signs or symptoms. Please see discharge instructions. Patient expresses understanding.

## 2018-09-26 LAB — CBC
HEMATOCRIT: 33.9 % — AB (ref 38.5–50.0)
HEMOGLOBIN: 12.2 g/dL — AB (ref 13.2–17.1)
MCH: 36.5 pg — ABNORMAL HIGH (ref 27.0–33.0)
MCHC: 36 g/dL (ref 32.0–36.0)
MCV: 101.5 fL — ABNORMAL HIGH (ref 80.0–100.0)
MPV: 9.5 fL (ref 7.5–12.5)
Platelets: 357 10*3/uL (ref 140–400)
RBC: 3.34 10*6/uL — AB (ref 4.20–5.80)
RDW: 16.2 % — ABNORMAL HIGH (ref 11.0–15.0)
WBC: 5.2 10*3/uL (ref 3.8–10.8)

## 2018-09-26 LAB — IRON,TIBC AND FERRITIN PANEL
%SAT: 26 % (ref 20–48)
Ferritin: 278 ng/mL (ref 38–380)
Iron: 71 ug/dL (ref 50–180)
TIBC: 275 ug/dL (ref 250–425)

## 2018-09-27 ENCOUNTER — Other Ambulatory Visit: Payer: Self-pay | Admitting: Family Medicine

## 2018-09-28 NOTE — Addendum Note (Signed)
Addended by: Rodolph Bong on: 09/28/2018 07:04 AM   Modules accepted: Orders

## 2018-10-13 ENCOUNTER — Other Ambulatory Visit: Payer: Self-pay | Admitting: Family Medicine

## 2018-10-13 DIAGNOSIS — E349 Endocrine disorder, unspecified: Secondary | ICD-10-CM

## 2018-10-18 ENCOUNTER — Other Ambulatory Visit: Payer: Self-pay | Admitting: Family Medicine

## 2018-10-20 LAB — CBC AND DIFFERENTIAL
HCT: 39 — AB (ref 41–53)
Hemoglobin: 13.4 — AB (ref 13.5–17.5)
Platelets: 297 (ref 150–399)
WBC: 3.8

## 2018-10-20 LAB — BASIC METABOLIC PANEL
BUN: 9 (ref 4–21)
CREATININE: 0.9 (ref 0.6–1.3)
Glucose: 84
POTASSIUM: 2.7 — AB (ref 3.4–5.3)

## 2018-10-20 LAB — HEPATIC FUNCTION PANEL
ALK PHOS: 72 (ref 25–125)
ALT: 22 (ref 10–40)
AST: 20 (ref 14–40)

## 2018-10-23 ENCOUNTER — Ambulatory Visit (INDEPENDENT_AMBULATORY_CARE_PROVIDER_SITE_OTHER): Payer: Managed Care, Other (non HMO) | Admitting: Family Medicine

## 2018-10-23 ENCOUNTER — Encounter: Payer: Self-pay | Admitting: Family Medicine

## 2018-10-23 VITALS — BP 129/88 | HR 105 | Ht 71.0 in | Wt 287.0 lb

## 2018-10-23 DIAGNOSIS — E876 Hypokalemia: Secondary | ICD-10-CM | POA: Diagnosis not present

## 2018-10-23 DIAGNOSIS — E538 Deficiency of other specified B group vitamins: Secondary | ICD-10-CM

## 2018-10-23 DIAGNOSIS — I10 Essential (primary) hypertension: Secondary | ICD-10-CM | POA: Diagnosis not present

## 2018-10-23 DIAGNOSIS — M5412 Radiculopathy, cervical region: Secondary | ICD-10-CM

## 2018-10-23 DIAGNOSIS — D539 Nutritional anemia, unspecified: Secondary | ICD-10-CM | POA: Diagnosis not present

## 2018-10-23 MED ORDER — LISINOPRIL 40 MG PO TABS: 40.0000 mg | ORAL_TABLET | Freq: Every day | ORAL | 3 refills | Status: DC

## 2018-10-23 MED ORDER — PREDNISONE 50 MG PO TABS: 50.0000 mg | ORAL_TABLET | Freq: Every day | ORAL | 0 refills | Status: DC

## 2018-10-23 MED ORDER — TRIAMTERENE-HCTZ 37.5-25 MG PO TABS: 1.0000 | ORAL_TABLET | Freq: Every day | ORAL | 3 refills | Status: DC

## 2018-10-23 NOTE — Progress Notes (Signed)
Dylan Munoz. is a 42 y.o. male who presents to [facility] Dylan Munoz: Primary Care Sports Medicine today for cervical radiculopathy, hypokalemia, macrocytosis.  Dylan Munoz has been seen previously for left-sided neck pain and cervical radiculopathy likely at the C6 dermatome.  He was last seen for this on October 11.  He had a trial of prednisone and gabapentin.  The prednisone helped temporarily and the gabapentin caused fatigue lightheadedness and dizziness and is not tolerated.  He notes he is continuing to have pain radiating down his left arm to his thumb worse with neck motion.  Symptoms are worsening and he is noted some occasional weakness into his hand but currently does not have any weakness.  He is willing to consider epidural steroid injections or surgery at this point.  Additionally he was seen in the emergency room recently and found to have hypokalemia with potassium around 2.7.  This was improved with potassium repletion was discharged about 3.3.  He has been taking lisinopril/hydrochlorothiazide for blood pressure.  Additionally has been taking amlodipine.  On discharge he started increasing his lisinopril/hydrochlorothiazide dose and is now taking 2 pills daily.  That is been for a few days now when he took it this morning total of 40 mg of lisinopril and 50 mg of hydrochlorothiazide.  He never did fill but has not started taking the potassium repletion pills that were prescribed after his hospital emergency room visit.  Additionally Dylan Munoz has had some anemia and macrocytosis recently.  He had a partial work-up including normal iron stores.  His MCV was around 100 but in the recent emergency room visit his MCV was 106.  He denies any significant abdominal pain.  He has had some weight loss over the last several months but he notes he only eats about 1 time a day typically and is having somewhat  intentional weight loss.  He denies significant night sweats.   ROS as above:  Exam:  BP 129/88   Pulse (!) 105   Ht 5\' 11"  (1.803 m)   Wt 287 lb (130.2 kg)   BMI 40.03 kg/m  Wt Readings from Last 5 Encounters:  10/23/18 287 lb (130.2 kg)  09/25/18 (!) 302 lb (137 kg)  06/26/18 (!) 305 lb (138.3 kg)  03/12/18 (!) 332 lb (150.6 kg)  01/19/18 (!) 340 lb (154.2 kg)    Gen: Well NAD HEENT: EOMI,  MMM Lungs: Normal work of breathing. CTABL Heart: RRR no MRG Abd: NABS, Soft. Nondistended, Nontender Exts: Brisk capillary refill, warm and well perfused.  C-spine: Normal-appearing nontender to spinal midline.  Decreased cervical motion.  Positive left-sided Spurling's test. Upper extremity strength is intact.  Sensation is intact throughout bilateral upper extremities.  Lab and Radiology Results  EXAM: CERVICAL SPINE - COMPLETE 4+ VIEW  COMPARISON:  None.  FINDINGS: Vertebral body height and alignment are maintained. Mild loss of disc space height is seen at C5-6 and C6-7. Scattered endplate spurring is noted. Prevertebral soft tissues appear normal. Lung apices are clear.  IMPRESSION: No acute abnormality.  Mild to moderate appearing degenerative disc disease C5-6 and C6-7.   Electronically Signed   By: Dylan Munoz M.D.   On: 09/25/2018 14:28 I personally (independently) visualized and performed the interpretation of the images attached in this note.  LABS  10/20/18   Basic Metabolic Panel  Result Value Ref Range   Sodium 140 135 - 146 MMOL/L  Potassium 3.3 (L) 3.5 - 5.3 MMOL/L  Chloride  98 98 - 110 MMOL/L  CO2 32 (H) 23 - 30 MMOL/L  BUN 8 8 - 24 MG/DL  Glucose 98 70 - 99 MG/DL  Creatinine 1.61 0.96 - 1.50 MG/DL  Calcium 8.5 8.5 - 04.5 MG/DL  Anion Gap 10 4 - 14 MMOL/L  Est. GFR Non-African American >=90 >=60 ML/MIN/1.73 M*2  Est. GFR African American >=90 >=60 ML/MIN/1.73 M*2  Urinalysis With Microscopic  Result Value Ref Range   CBC and  Differential  Result Value Ref Range  WBC 3.8 (L) 4.0 - 10.5 x 10*3/uL  RBC 3.69 (L) 4.22 - 5.81 x 10*6/uL  Hemoglobin 13.4 13.0 - 17.0 G/DL  Hematocrit 40.9 81.1 - 52.0 %  MCV 106.3 (H) 78.0 - 100.0 FL  MCH 36.4 (H) 26.0 - 34.0 PG  MCHC 34.2 30.0 - 36.0 G/DL  RDW 91.4 (H) 78.2 - 95.6 %  Platelets 297 150 - 400 X 10*3/uL  MPV 7.3 7.2 - 11.0 FL  Neutrophil % 51 %  Lymphocyte % 40 %  Monocyte % 7 %  Eosinophil % 1 %  Basophil % 1 %  Neutrophil Absolute 1.9 1.7 - 7.7 x 10*3/uL  Lymphocyte Absolute 1.5 0.7 - 4.0 x 10*3/uL  Monocyte Absolute 0.3 0.1 - 1.0 x 10*3/uL  Eosinophil Absolute 0.0 0.0 - 0.7 x 10*3/uL  Basophil Absolute 0.0 0.0 - 0.1 x 10*3/uL   Comprehensive Metabolic Panel  Result Value Ref Range  Sodium 143 135 - 146 MMOL/L  Potassium 2.7 (LL) 3.5 - 5.3 MMOL/L  Chloride 101 98 - 110 MMOL/L  CO2 30 23 - 30 MMOL/L  BUN 9 8 - 24 MG/DL  Glucose 84 70 - 99 MG/DL  Creatinine 2.13 0.86 - 1.50 MG/DL  Calcium 8.2 (L) 8.5 - 10.5 MG/DL  Total Protein 6.7 6.0 - 8.3 G/DL  Albumin 3.8 3.5 - 5.0 G/DL  Total Bilirubin 0.9 0.1 - 1.2 MG/DL  Alkaline Phosphatase 72 25 - 125 IU/L or U/L  AST (SGOT) 20 5 - 40 IU/L or U/L  ALT (SGPT) 22 5 - 50 IU/L or U/L  Anion Gap 12 4 - 14 MMOL/L  Est. GFR Non-African American >=90 >=60 ML/MIN/1.73 M*2  Est. GFR African American >=90 >=60 ML/MIN/1.73 M*2   Assessment and Plan: 42 y.o. male with  Cervical radiculopathy.  Likely left C6 nerve dermatome.  Failing conservative management.  Plan for MRI for epidural steroid injection planning.  Plan to repeat steroid injection for temporary control of pain.  Following MRI will likely order epidural steroid and return to clinic to discuss results.  Hypokalemia: Likely hydrochlorothiazide related.  Adjusting medication as below for hypertension control.  Will split lisinopril from hydrochlorothiazide and increase to what he is currently taking right now 40 mg daily. Additionally will switch from  hydrochlorothiazide to Maxide containing triamterene.  This reduces the total hydrochlorothiazide dose from 50 mg to 25 mg which should have less potassium wasting.  Plan to not take the potassium chloride which was prescribed at discharge.  Recheck electrolytes and metabolic panel in 1 week.  Additionally will assess magnesium as low magnesium can also cause hypokalemia.  Hypertension: As noted above patient has already increased his blood pressure regimen to a total of 40 mg of lisinopril and 50 mg of hydrochlorothiazide.  Will simplify regimen as above.  Continue amlodipine as well.  Macrocytosis: MCV 106 at recent ED visit.  Unclear etiology.  This is associated with a bit of weight loss that is likely intentional and explained by diet  alone.  I do not have great explanation for microcytosis however.  He does not have significant liver inflammation seen on previous lab assessment.  We will check B12 and folate as below.  If continuing to be unclear may refer to hematology for help with work-up.   Orders Placed This Encounter  Procedures  . MR Cervical Spine Wo Contrast    Standing Status:   Future    Standing Expiration Date:   12/24/2019    Order Specific Question:   What is the patient's sedation requirement?    Answer:   No Sedation    Order Specific Question:   Does the patient have a pacemaker or implanted devices?    Answer:   No    Order Specific Question:   Preferred imaging location?    Answer:   [facility] (table limit-350lbs)    Order Specific Question:   Radiology Contrast Protocol - do NOT remove file path    Answer:   [email or web address]  . COMPLETE METABOLIC PANEL WITH GFR  . Magnesium  . Vitamin B12  . CBC   Meds ordered this encounter  Medications  . lisinopril (PRINIVIL,ZESTRIL) 40 MG tablet    Sig: Take 1 tablet (40 mg total) by mouth daily.    Dispense:  90 tablet    Refill:  3  . triamterene-hydrochlorothiazide (MAXZIDE-25)  37.5-25 MG tablet    Sig: Take 1 tablet by mouth daily.    Dispense:  90 tablet    Refill:  3  . predniSONE (DELTASONE) 50 MG tablet    Sig: Take 1 tablet (50 mg total) by mouth daily.    Dispense:  5 tablet    Refill:  0     Historical information moved to improve visibility of documentation.  Past Medical History:  Diagnosis Date  . High cholesterol   . Hypertension   . Morbid obesity (HCC) 04/17/2016  . Severe obstructive sleep apnea 04/17/2016   AHI 14.9 with desats <88% for >90 mins on SNAP report 04/02/18. See scanned document.  . Testosterone deficiency 04/19/2016  . Vitamin D deficiency 04/19/2016   Past Surgical History:  Procedure Laterality Date  . KNEE ARTHROSCOPY W/ ACL RECONSTRUCTION     Social History   Tobacco Use  . Smoking status: Never Smoker  . Smokeless tobacco: Current User    Types: Chew  Substance Use Topics  . Alcohol use: Yes    Alcohol/week: 0.0 standard drinks    Comment: weekly   family history includes Diabetes in his paternal uncle; Heart disease in his father.  Medications: Current Outpatient Medications  Medication Sig Dispense Refill  . Alcohol Swabs (ALCOHOL PREP) PADS Use to clean skin and vials for testosterone injections. 100 each prn  . AMBULATORY NON FORMULARY MEDICATION Please set CPAP to 13 cm of water.  15-minute ramp.  Adjust mask fit.  Face-to-face note will be attached as well. 1 each 0  . amLODipine (NORVASC) 10 MG tablet TAKE 1 TABLET (10 MG TOTAL) BY MOUTH DAILY. APPOINTMENT NEEDED FOR FURTHER REFILLS 30 tablet 0  . atorvastatin (LIPITOR) 80 MG tablet TAKE 1 TABLET BY MOUTH EVERY DAY 90 tablet 1  . BD HYPODERMIC NEEDLE 18G X 1" MISC USE TO DRAW UP TESTOSTERONE WEEKLY 25 each prn  . fluconazole (DIFLUCAN) 150 MG tablet Take 2 tablets (300 mg total) by mouth once a week. 8 tablet 1  . linaclotide (LINZESS) 72 MCG capsule Take 1 capsule (72 mcg total) by mouth daily before breakfast. 90  capsule 1  . NEEDLE, DISP, 18 G (B-D BLUNT  FILL NEEDLE) 18G X 1-1/2" MISC Use to draw up testosterone weekly 50 each 0  . SYRINGE-NEEDLE, DISP, 3 ML (B-D 3CC LUER-LOK SYR 22GX1-1/2) 22G X 1-1/2" 3 ML MISC USE TO ADMINISTER TESTOSTERONE WEEKLY 50 each 1  . testosterone cypionate (DEPOTESTOSTERONE CYPIONATE) 200 MG/ML injection INJECT 0.5 MLS (100 MG TOTAL) INTO THE MUSCLE ONCE A WEEK. NEED LABS AND FOLLOW UP APPOINTMENT 10 mL 0  . Vitamin D, Ergocalciferol, (DRISDOL) 50000 units CAPS capsule TAKE 1 CAPSULE (50,000 UNITS TOTAL) BY MOUTH EVERY 7 (SEVEN) DAYS. TAKE FOR 8 TOTAL WEEKS 8 capsule 0  . lisinopril (PRINIVIL,ZESTRIL) 40 MG tablet Take 1 tablet (40 mg total) by mouth daily. 90 tablet 3  . predniSONE (DELTASONE) 50 MG tablet Take 1 tablet (50 mg total) by mouth daily. 5 tablet 0  . triamterene-hydrochlorothiazide (MAXZIDE-25) 37.5-25 MG tablet Take 1 tablet by mouth daily. 90 tablet 3   No current facility-administered medications for this visit.    No Known Allergies   Discussed warning signs or symptoms. Please see discharge instructions. Patient expresses understanding.

## 2018-10-23 NOTE — Patient Instructions (Addendum)
Thank you for coming in today. You should hear about MRI soon.  Let me know if you do not hear anything.  STOP Lisinopril/HCTZ START lisinopril 40 for blood pressure START Maxzide (Triamterine/HCTZ) for blood pressure  Get labs next Friday.   Recheck with me after MRI to talk about injections.   Repeat short course of prednisone.   Do not take potassium pill.   STOP linzess for a while for diarrhea.   For sleep try over the counter natural melatonin.  If not helpful let me know and I will prescribe Trazodone.

## 2018-10-30 ENCOUNTER — Ambulatory Visit: Payer: Managed Care, Other (non HMO) | Admitting: Family Medicine

## 2018-11-05 ENCOUNTER — Telehealth: Payer: Self-pay | Admitting: Family Medicine

## 2018-11-05 NOTE — Telephone Encounter (Signed)
MRI C-spine denied.  I am in the process of requesting a peer to peer consultation which usually gets it approved.  This should be tentatively scheduled for Monday or Tuesday of next week.  I will keep you updated when I know more.

## 2018-11-07 LAB — COMPLETE METABOLIC PANEL WITH GFR
AG RATIO: 1.5 (calc) (ref 1.0–2.5)
ALT: 29 U/L (ref 9–46)
AST: 23 U/L (ref 10–40)
Albumin: 3.9 g/dL (ref 3.6–5.1)
Alkaline phosphatase (APISO): 62 U/L (ref 40–115)
BUN: 13 mg/dL (ref 7–25)
CALCIUM: 9 mg/dL (ref 8.6–10.3)
CO2: 33 mmol/L — ABNORMAL HIGH (ref 20–32)
CREATININE: 0.94 mg/dL (ref 0.60–1.35)
Chloride: 100 mmol/L (ref 98–110)
GFR, EST NON AFRICAN AMERICAN: 100 mL/min/{1.73_m2} (ref >=60)
GFR, Est African American: 115 mL/min/{1.73_m2} (ref >=60)
Globulin: 2.6 g/dL (calc) (ref 1.9–3.7)
Glucose, Bld: 82 mg/dL (ref 65–99)
POTASSIUM: 3.7 mmol/L (ref 3.5–5.3)
Sodium: 140 mmol/L (ref 135–146)
TOTAL PROTEIN: 6.5 g/dL (ref 6.1–8.1)
Total Bilirubin: 0.8 mg/dL (ref 0.2–1.2)

## 2018-11-07 LAB — CBC
HEMATOCRIT: 39.2 % (ref 38.5–50.0)
HEMOGLOBIN: 13.9 g/dL (ref 13.2–17.1)
MCH: 35.4 pg — ABNORMAL HIGH (ref 27.0–33.0)
MCHC: 35.5 g/dL (ref 32.0–36.0)
MCV: 99.7 fL (ref 80.0–100.0)
MPV: 9.2 fL (ref 7.5–12.5)
Platelets: 363 10*3/uL (ref 140–400)
RBC: 3.93 10*6/uL — AB (ref 4.20–5.80)
RDW: 14.6 % (ref 11.0–15.0)
WBC: 5.2 10*3/uL (ref 3.8–10.8)

## 2018-11-07 LAB — VITAMIN B12: Vitamin B-12: 182 pg/mL — ABNORMAL LOW (ref 200–1100)

## 2018-11-07 LAB — MAGNESIUM: Magnesium: 1.6 mg/dL (ref 1.5–2.5)

## 2018-11-09 DIAGNOSIS — E538 Deficiency of other specified B group vitamins: Secondary | ICD-10-CM | POA: Insufficient documentation

## 2018-11-09 MED ORDER — B-12 5000 MCG SL SUBL: 1.0000 | SUBLINGUAL_TABLET | Freq: Every day | SUBLINGUAL | 1 refills | Status: DC

## 2018-11-09 NOTE — Addendum Note (Signed)
Addended by: Rodolph BongOREY, Brenan Modesto S on: 11/09/2018 07:02 AM   Modules accepted: Orders

## 2018-11-19 ENCOUNTER — Telehealth: Payer: Self-pay | Admitting: Family Medicine

## 2018-11-19 NOTE — Telephone Encounter (Signed)
We had a problem initially with peer-to-peer approval for the MRI.  I do spoke with the insurance company and I have given them more information and they are going to relook at it.  They expect to have a response back within 2 business days which should be end of business on Monday.

## 2018-11-20 NOTE — Telephone Encounter (Signed)
Called patient to give advise but was unable to leave a message. Rhonda Cunningham,CMA

## 2018-12-11 ENCOUNTER — Other Ambulatory Visit: Payer: Self-pay | Admitting: Family Medicine

## 2018-12-24 ENCOUNTER — Encounter: Payer: Self-pay | Admitting: Family Medicine

## 2018-12-28 ENCOUNTER — Encounter: Payer: Self-pay | Admitting: Family Medicine

## 2019-01-01 ENCOUNTER — Ambulatory Visit: Payer: Managed Care, Other (non HMO) | Admitting: Family Medicine

## 2019-01-04 ENCOUNTER — Emergency Department (HOSPITAL_BASED_OUTPATIENT_CLINIC_OR_DEPARTMENT_OTHER): Payer: Managed Care, Other (non HMO)

## 2019-01-04 ENCOUNTER — Encounter (HOSPITAL_BASED_OUTPATIENT_CLINIC_OR_DEPARTMENT_OTHER): Payer: Self-pay

## 2019-01-04 ENCOUNTER — Other Ambulatory Visit: Payer: Self-pay

## 2019-01-04 ENCOUNTER — Emergency Department (HOSPITAL_BASED_OUTPATIENT_CLINIC_OR_DEPARTMENT_OTHER)
Admission: EM | Admit: 2019-01-04 | Discharge: 2019-01-04 | Disposition: A | Discharge status: Home / Self Care | Payer: Managed Care, Other (non HMO) | Attending: Emergency Medicine | Admitting: Emergency Medicine

## 2019-01-04 DIAGNOSIS — K625 Hemorrhage of anus and rectum: Secondary | ICD-10-CM | POA: Diagnosis present

## 2019-01-04 DIAGNOSIS — Z79899 Other long term (current) drug therapy: Secondary | ICD-10-CM | POA: Diagnosis not present

## 2019-01-04 DIAGNOSIS — K602 Anal fissure, unspecified: Secondary | ICD-10-CM

## 2019-01-04 DIAGNOSIS — K6289 Other specified diseases of anus and rectum: Secondary | ICD-10-CM | POA: Insufficient documentation

## 2019-01-04 DIAGNOSIS — I10 Essential (primary) hypertension: Secondary | ICD-10-CM | POA: Diagnosis not present

## 2019-01-04 LAB — CBC WITH DIFFERENTIAL/PLATELET
Abs Immature Granulocytes: 0.02 10*3/uL (ref 0.00–0.07)
Basophils Absolute: 0 10*3/uL (ref 0.0–0.1)
Basophils Relative: 0 %
Eosinophils Absolute: 0.1 10*3/uL (ref 0.0–0.5)
Eosinophils Relative: 1 %
HCT: 43.5 % (ref 39.0–52.0)
Hemoglobin: 14.8 g/dL (ref 13.0–17.0)
Immature Granulocytes: 0 %
Lymphocytes Relative: 26 %
Lymphs Abs: 1.8 10*3/uL (ref 0.7–4.0)
MCH: 33.5 pg (ref 26.0–34.0)
MCHC: 34 g/dL (ref 30.0–36.0)
MCV: 98.4 fL (ref 80.0–100.0)
Monocytes Absolute: 0.7 10*3/uL (ref 0.1–1.0)
Monocytes Relative: 11 %
Neutro Abs: 4.1 10*3/uL (ref 1.7–7.7)
Neutrophils Relative %: 62 %
Platelets: 282 10*3/uL (ref 150–400)
RBC: 4.42 MIL/uL (ref 4.22–5.81)
RDW: 13.3 % (ref 11.5–15.5)
WBC: 6.8 10*3/uL (ref 4.0–10.5)
nRBC: 0 % (ref 0.0–0.2)

## 2019-01-04 LAB — BASIC METABOLIC PANEL
Anion gap: 10 (ref 5–15)
BUN: 15 mg/dL (ref 6–20)
CO2: 24 mmol/L (ref 22–32)
Calcium: 9.2 mg/dL (ref 8.9–10.3)
Chloride: 100 mmol/L (ref 98–111)
Creatinine, Ser: 1.36 mg/dL — ABNORMAL HIGH (ref 0.61–1.24)
GFR calc Af Amer: >60 mL/min (ref >60)
GFR calc non Af Amer: >60 mL/min (ref >60)
Glucose, Bld: 91 mg/dL (ref 70–99)
Potassium: 4 mmol/L (ref 3.5–5.1)
Sodium: 134 mmol/L — ABNORMAL LOW (ref 135–145)

## 2019-01-04 MED ORDER — KETOROLAC TROMETHAMINE 30 MG/ML IJ SOLN
30.0000 mg | Freq: Once | INTRAMUSCULAR | Status: AC
  Administered 2019-01-04: 30 mg via INTRAVENOUS
  Filled 2019-01-04: qty 1

## 2019-01-04 MED ORDER — LIDOCAINE HCL 2 % EX GEL: 1.0000 "application " | CUTANEOUS | 0 refills | Status: DC | PRN

## 2019-01-04 MED ORDER — SODIUM CHLORIDE 0.9 % IV BOLUS
500.0000 mL | Freq: Once | INTRAVENOUS | Status: AC
  Administered 2019-01-04: 500 mL via INTRAVENOUS

## 2019-01-04 MED ORDER — LIDOCAINE HCL URETHRAL/MUCOSAL 2 % EX GEL
1.0000 "application " | Freq: Once | CUTANEOUS | Status: AC
  Administered 2019-01-04: 1 via TOPICAL
  Filled 2019-01-04: qty 20

## 2019-01-04 MED ORDER — POLYETHYLENE GLYCOL 3350 17 G PO PACK: 17.0000 g | PACK | Freq: Every day | ORAL | 0 refills | Status: DC

## 2019-01-04 MED ORDER — NITROGLYCERIN 0.4 % RE OINT: 1.0000 [in_us] | TOPICAL_OINTMENT | Freq: Two times a day (BID) | RECTAL | 0 refills | Status: DC

## 2019-01-04 MED ORDER — DOCUSATE SODIUM 100 MG PO CAPS: 100.0000 mg | ORAL_CAPSULE | Freq: Two times a day (BID) | ORAL | 0 refills | Status: DC

## 2019-01-04 MED ORDER — IOPAMIDOL (ISOVUE-300) INJECTION 61%
100.0000 mL | Freq: Once | INTRAVENOUS | Status: AC | PRN
  Administered 2019-01-04: 100 mL via INTRAVENOUS

## 2019-01-04 MED FILL — LIDOCAINE HCL 2% JELLY: 2 | 10 days supply | Qty: 30 | Fill #0

## 2019-01-04 NOTE — Discharge Instructions (Addendum)
Start taking stool softener and fiber supplement to help with your bowel movements.  Apply topical nitroglycerin twice daily for up to 3 weeks as well as lidocaine jelly as needed for pain.  Drink plenty of water.  You can take sits baths which may be helpful for your pain.  Follow-up with your primary care physician for reevaluation of your symptoms.  Return to the emergency department if any concerning signs or symptoms develop such as fevers, persistent vomiting, abdominal pain, or swelling.

## 2019-01-04 NOTE — ED Triage Notes (Addendum)
C/o intermittent rectal pain x 4 days-one episode bloody stool x 1 the day before rectal pain started-NAD-steady gait

## 2019-01-04 NOTE — ED Provider Notes (Signed)
[facility] EMERGENCY DEPARTMENT Provider Note   CSN: [id number] Arrival date & time: 01/04/19  1248     History   Chief Complaint Chief Complaint  Patient presents with  . Rectal Bleeding    HPI Dylan Munoz. is a 43 y.o. male with history of hypertension, high cholesterol, morbid obesity, OSA, vitamin D deficiency, testosterone deficiency presents for evaluation of acute onset, intermittent rectal pain for 4 days.  He reports 2 episodes of bright red blood per rectum while attempting to have a bowel movement on Thursday.  Pain is throbbing and nagging, worsens with attempts to have a bowel movement or moving certain ways.  Denies nausea, vomiting, fevers, abdominal pain, chest pain, shortness of breath.  He has tried Preparation H without relief of his symptoms.  Denies testicular pain or scrotal swelling.  The history is provided by the patient.    Past Medical History:  Diagnosis Date  . High cholesterol   . Hypertension   . Morbid obesity (HCC) 04/17/2016  . Severe obstructive sleep apnea 04/17/2016   AHI 14.9 with desats <88% for >90 mins on SNAP report 04/02/18. See scanned document.  . Testosterone deficiency 04/19/2016  . Vitamin D deficiency 04/19/2016    Patient Active Problem List   Diagnosis Date Noted  . B12 deficiency 11/09/2018  . Constipation 06/26/2018  . Cramping of hands 05/28/2016  . Hyperlipidemia 04/19/2016  . Testosterone deficiency 04/19/2016  . Vitamin D deficiency 04/19/2016  . HTN (hypertension) 04/17/2016  . Morbid obesity (HCC) 04/17/2016  . Severe obstructive sleep apnea 04/17/2016  . SOB (shortness of breath) on exertion 04/17/2016  . Left knee pain 04/17/2016  . Paresthesia and pain of both upper extremities 04/17/2016    Past Surgical History:  Procedure Laterality Date  . KNEE ARTHROSCOPY W/ ACL RECONSTRUCTION          Home Medications    Prior to Admission medications   Medication Sig Start Date End Date Taking?  Authorizing Provider  Alcohol Swabs (ALCOHOL PREP) PADS Use to clean skin and vials for testosterone injections. 11/12/17   Rodolph Bong, MD  AMBULATORY NON FORMULARY MEDICATION Please set CPAP to 13 cm of water.  15-minute ramp.  Adjust mask fit.  Face-to-face note will be attached as well. 07/01/18   Rodolph Bong, MD  amLODipine (NORVASC) 10 MG tablet TAKE 1 TABLET (10 MG TOTAL) BY MOUTH DAILY. APPOINTMENT NEEDED FOR FURTHER REFILLS 05/26/18   Rodolph Bong, MD  atorvastatin (LIPITOR) 80 MG tablet TAKE 1 TABLET BY MOUTH EVERY DAY 09/16/18   Rodolph Bong, MD  BD HYPODERMIC NEEDLE 18G X 1" MISC USE TO DRAW UP TESTOSTERONE WEEKLY 10/29/17   Rodolph Bong, MD  Cyanocobalamin (B-12) 5000 MCG SUBL Place 1 tablet under the tongue daily. 11/09/18   Rodolph Bong, MD  cyclobenzaprine (FLEXERIL) 10 MG tablet TAKE 1 TABLET BY MOUTH TWICE A DAY AS NEEDED FOR MUSCLE SPASMS 12/14/18   Rodolph Bong, MD  docusate sodium (COLACE) 100 MG capsule Take 1 capsule (100 mg total) by mouth every 12 (twelve) hours. 01/04/19   Rola Lennon A, PA-C  fluconazole (DIFLUCAN) 150 MG tablet Take 2 tablets (300 mg total) by mouth once a week. 09/25/18   Rodolph Bong, MD  lidocaine (XYLOCAINE) 2 % jelly Apply 1 application topically as needed. 01/04/19   Luevenia Maxin, Pacer Dorn A, PA-C  linaclotide (LINZESS) 72 MCG capsule Take 1 capsule (72 mcg total) by mouth daily before breakfast. 09/25/18  Rodolph Bongorey, Evan S, MD  lisinopril (PRINIVIL,ZESTRIL) 40 MG tablet Take 1 tablet (40 mg total) by mouth daily. 10/23/18   Rodolph Bongorey, Evan S, MD  NEEDLE, DISP, 18 G (B-D BLUNT FILL NEEDLE) 18G X 1-1/2" MISC Use to draw up testosterone weekly 09/09/17   Rodolph Bongorey, Evan S, MD  Nitroglycerin 0.4 % OINT Place 1 inch rectally 2 (two) times daily. 01/04/19   Shalisha Clausing A, PA-C  polyethylene glycol (MIRALAX / GLYCOLAX) packet Take 17 g by mouth daily. 01/04/19   Yaelis Scharfenberg A, PA-C  predniSONE (DELTASONE) 50 MG tablet Take 1 tablet (50 mg total) by mouth daily. 10/23/18    Rodolph Bongorey, Evan S, MD  SYRINGE-NEEDLE, DISP, 3 ML (B-D 3CC LUER-LOK SYR 22GX1-1/2) 22G X 1-1/2" 3 ML MISC USE TO ADMINISTER TESTOSTERONE WEEKLY 07/31/18   Rodolph Bongorey, Evan S, MD  testosterone cypionate (DEPOTESTOSTERONE CYPIONATE) 200 MG/ML injection INJECT 0.5 MLS (100 MG TOTAL) INTO THE MUSCLE ONCE A WEEK. NEED LABS AND FOLLOW UP APPOINTMENT 10/13/18   Rodolph Bongorey, Evan S, MD  triamterene-hydrochlorothiazide (MAXZIDE-25) 37.5-25 MG tablet Take 1 tablet by mouth daily. 10/23/18   Rodolph Bongorey, Evan S, MD  Vitamin D, Ergocalciferol, (DRISDOL) 50000 units CAPS capsule TAKE 1 CAPSULE (50,000 UNITS TOTAL) BY MOUTH EVERY 7 (SEVEN) DAYS. TAKE FOR 8 TOTAL WEEKS 05/20/18   Rodolph Bongorey, Evan S, MD    Family History Family History  Problem Relation Age of Onset  . Heart disease Father   . Diabetes Paternal Uncle     Social History Social History   Tobacco Use  . Smoking status: Never Smoker  . Smokeless tobacco: Current User    Types: Chew  Substance Use Topics  . Alcohol use: Yes    Alcohol/week: 0.0 standard drinks    Comment: weekly  . Drug use: No     Allergies   Patient has no known allergies.   Review of Systems Review of Systems  Constitutional: Negative for chills and fever.  Respiratory: Negative for shortness of breath.   Cardiovascular: Negative for chest pain.  Gastrointestinal: Positive for blood in stool and rectal pain.  Genitourinary: Negative for genital sores and scrotal swelling.  All other systems reviewed and are negative.    Physical Exam Updated Vital Signs BP 126/88 (BP Location: Left Arm)   Pulse 100   Temp 99.2 F (37.3 C) (Oral)   Resp 20   Ht 5\' 11"  (1.803 m)   Wt 127.9 kg   SpO2 100%   BMI 39.33 kg/m   Physical Exam Vitals signs and nursing note reviewed. Exam conducted with a chaperone present.  Constitutional:      General: He is not in acute distress.    Appearance: He is well-developed.  HENT:     Head: Normocephalic and atraumatic.  Eyes:     General:         Right eye: No discharge.        Left eye: No discharge.     Conjunctiva/sclera: Conjunctivae normal.  Neck:     Vascular: No JVD.     Trachea: No tracheal deviation.  Cardiovascular:     Rate and Rhythm: Tachycardia present.     Heart sounds: Normal heart sounds.  Pulmonary:     Effort: Pulmonary effort is normal.     Breath sounds: Normal breath sounds.  Abdominal:     General: Bowel sounds are normal. There is no distension.     Tenderness: There is no abdominal tenderness. There is no right CVA tenderness, left CVA  tenderness, guarding or rebound.  Genitourinary:    Rectum: Tenderness and anal fissure present.       Comments: Examination performed in the presence of a chaperone.  Anal fissure/ulceration at the 12 o'clock position.  Mild fullness and swelling noted at the anal verge.  No frank rectal bleeding noted.  No perineal tenderness or swelling noted. Skin:    Findings: No erythema.  Neurological:     Mental Status: He is alert.  Psychiatric:        Behavior: Behavior normal.      ED Treatments / Results  Labs (all labs ordered are listed, but only abnormal results are displayed) Labs Reviewed  BASIC METABOLIC PANEL - Abnormal; Notable for the following components:      Result Value   Sodium 134 (*)    Creatinine, Ser 1.36 (*)    All other components within normal limits  CBC WITH DIFFERENTIAL/PLATELET    EKG None  Radiology Ct Abdomen Pelvis W Contrast  Result Date: 01/04/2019 CLINICAL DATA:  Rectal pain for several days EXAM: CT ABDOMEN AND PELVIS WITH CONTRAST TECHNIQUE: Multidetector CT imaging of the abdomen and pelvis was performed using the standard protocol following bolus administration of intravenous contrast. CONTRAST:  100mL ISOVUE-300 COMPARISON:  10/22/2010 FINDINGS: Lower chest: No acute abnormality. Hepatobiliary: Liver shows mild fatty infiltration. The gallbladder is within normal limits. Pancreas: Unremarkable. No pancreatic ductal  dilatation or surrounding inflammatory changes. Spleen: Normal in size without focal abnormality. Adrenals/Urinary Tract: Adrenal glands are within normal limits. The kidneys are well visualize without renal calculi or obstructive changes. The ureters are within normal limits. The bladder is decompressed. Stomach/Bowel: Mild diverticular change of the colon is noted without diverticulitis. The appendix is within normal limits. No inflammatory changes are seen. The small bowel is unremarkable. The stomach is decompressed as well. Vascular/Lymphatic: Aortic atherosclerosis. No enlarged abdominal or pelvic lymph nodes. Reproductive: Prostate is unremarkable. Other: No abdominal wall hernia or abnormality. No abdominopelvic ascites. Musculoskeletal: Degenerative changes of lumbar spine are seen. No compression deformities are noted. IMPRESSION: Chronic changes as described above.  No acute abnormality noted. Electronically Signed   By: Alcide CleverMark  Lukens M.D.   On: 01/04/2019 16:10    Procedures Procedures (including critical care time)  Medications Ordered in ED Medications  lidocaine (XYLOCAINE) 2 % jelly 1 application (1 application Topical Given 01/04/19 1449)  ketorolac (TORADOL) 30 MG/ML injection 30 mg (30 mg Intravenous Given 01/04/19 1612)  sodium chloride 0.9 % bolus 500 mL (500 mLs Intravenous New Bag/Given 01/04/19 1610)  iopamidol (ISOVUE-300) 61 % injection 100 mL (100 mLs Intravenous Contrast Given 01/04/19 1548)     Initial Impression / Assessment and Plan / ED Course  I have reviewed the triage vital signs and the nursing notes.  Pertinent labs & imaging results that were available during my care of the patient were reviewed by me and considered in my medical decision making (see chart for details).     Patient with rectal pain for 4 days.  History of some bright red blood per rectum when attempting to have a bowel movement 4 days ago but none since.  No abdominal pain, benign abdominal exam  today.  No testicular pain or scrotal swelling.  No perineal pain.  He is afebrile, initially tachycardic but does appear quite anxious.  His tachycardia improved on reassessment.  Lab work shows no leukocytosis, mildly elevated creatinine though BUN is normal suggesting that this may be chronic.  No anemia.  Imaging shows  no evidence of anal/rectal abscess, Crohn's disease, ulcerative colitis, obstruction, perforation, appendicitis, or other acute surgical abdominal pathology.  On examination he has an anal fissure.  Recommend sitz bath, stool softener, fiber supplement, and topical analgesics and nitroglycerin cream.  Recommend follow with PCP for reevaluation of symptoms.  Discussed strict ED return precautions Pt verbalized understanding of and agreement with plan and is safe for discharge home at this time.  No complaints prior to discharge.  Patient seen and evaluated by Dr. Madilyn Hook who agrees with assessment and plan at this time.  Final Clinical Impressions(s) / ED Diagnoses   Final diagnoses:  Anal fissure  Rectal pain    ED Discharge Orders         Ordered    polyethylene glycol (MIRALAX / GLYCOLAX) packet  Daily     01/04/19 1655    docusate sodium (COLACE) 100 MG capsule  Every 12 hours     01/04/19 1655    lidocaine (XYLOCAINE) 2 % jelly  As needed     01/04/19 1655    Nitroglycerin 0.4 % OINT  2 times daily     01/04/19 [address] [place] A, PA-C 01/04/19 1702    Tilden Fossa, MD 01/05/19 [phone]

## 2019-01-19 ENCOUNTER — Encounter: Payer: Self-pay | Admitting: Family Medicine

## 2019-01-19 DIAGNOSIS — K5904 Chronic idiopathic constipation: Secondary | ICD-10-CM

## 2019-01-22 ENCOUNTER — Encounter: Payer: Self-pay | Admitting: Gastroenterology

## 2019-01-22 ENCOUNTER — Ambulatory Visit: Payer: Managed Care, Other (non HMO) | Admitting: Gastroenterology

## 2019-01-22 VITALS — BP 132/86 | HR 106 | Ht 72.0 in | Wt 288.5 lb

## 2019-01-22 DIAGNOSIS — K602 Anal fissure, unspecified: Secondary | ICD-10-CM | POA: Diagnosis not present

## 2019-01-22 DIAGNOSIS — K5900 Constipation, unspecified: Secondary | ICD-10-CM | POA: Diagnosis not present

## 2019-01-22 DIAGNOSIS — K921 Melena: Secondary | ICD-10-CM

## 2019-01-22 MED ORDER — LIDOCAINE HCL URETHRAL/MUCOSAL 2 % EX GEL: 1.0000 "application " | Freq: Two times a day (BID) | CUTANEOUS | 1 refills | Status: DC | PRN

## 2019-01-22 MED ORDER — AMBULATORY NON FORMULARY MEDICATION: 1.0000 [in_us] | Freq: Two times a day (BID) | 2 refills | Status: AC

## 2019-01-22 MED ORDER — AMBULATORY NON FORMULARY MEDICATION: 1.0000 [in_us] | Freq: Two times a day (BID) | 1 refills | Status: DC

## 2019-01-22 NOTE — Patient Instructions (Addendum)
If you are age 43 or older, your body mass index should be between 23-30. Your Body mass index is 39.13 kg/m. If this is out of the aforementioned range listed, please consider follow up with your Primary Care Provider.  If you are age 66 or younger, your body mass index should be between 19-25. Your Body mass index is 39.13 kg/m. If this is out of the aformentioned range listed, please consider follow up with your Primary Care Provider.   You have been scheduled for a colonoscopy. Please follow written instructions given to you at your visit today.  Please pick up your prep supplies at the pharmacy within the next 1-3 days. If you use inhalers (even only as needed), please bring them with you on the day of your procedure. Your physician has requested that you go to www.startemmi.com and enter the access code given to you at your visit today. This web site gives a general overview about your procedure. However, you should still follow specific instructions given to you by our office regarding your preparation for the procedure.  We have sent the following medications to your pharmacy for you to pick up at your convenience: Clenpiq NTG 0.125% Place 1 inch rectally as directed twice daily for 8 weeks. Topical Lidocaine 2% twice daily as needed.  Please call our office at [phone] to set up your 3 month follow up visit.  It was a pleasure to see you today!  Vito Cirigliano, D.O.

## 2019-01-22 NOTE — Progress Notes (Signed)
Chief Complaint: Anal fissure, dyschezia, hematochezia, constipation  Referring Provider:     Rodolph Bongorey, Evan S, MD  HPI:    Dylan CullensWendell Carrell Jr. is a 43 y.o. male referred to the [facility]ology [facility] for evaluation of anal fissure, constipation, hematochezia.  Today, he states sxs started 1 year ago as intermittent constipation with exacerbation for 1 month in 09/2018. Treated with trial of Linzess x6 months and started to have diarrhea followed by burning sensation in rectum. Decreased dose to 72 mcg/day in 09/2018, but still w diarrhea and burning, so stopped 1 month later.   [facility]/2023 rectal pain and BRBPR.  No relief with outpatient use of Preparation H.  CT abdomen/pelvis only n/f fatty liver infiltration, diverticulosis and otherwise normal stomach/bowel, pancreas, GBExam notable for anal fissure in 12 o'clock position.  Treated with topical lidocaine, and discharged with topical lidocaine, topical NTG 0.4% twice daily, sitz bath, Colace, MiraLAX, recommended fiber supplement.  Hasnt had much relief of constipation since changing to Colace as prescribed by ER.  Still with straining to have BM. Has seen BRBPR on stool x2 in January (pre-ER). Has not tried the Miralax, Lidocaine or NTG as prescribed by ER.   Recent labs notable for normal CBC, sodium 134 and creatinine 1.36 (previously 0.94) with otherwise normal BMP.  Normal LAE's but B12 insufficiency (182) in 10/2018, normal iron panel in 09/2018.    No known family history of CRC, GI malignancy, liver disease, pancreatic disease, or IBD.   No previous colonoscopy or EGD.   Past Medical History:  Diagnosis Date  . High cholesterol   . Hypertension   . Morbid obesity (HCC) 04/17/2016  . Severe obstructive sleep apnea 04/17/2016   AHI 14.9 with desats <88% for >90 mins on SNAP report 04/02/18. See scanned document.  . Testosterone deficiency 04/19/2016  . Vitamin D deficiency 04/19/2016      Past Surgical History:  Procedure Laterality Date  . KNEE ARTHROSCOPY W/ ACL RECONSTRUCTION     Family History  Problem Relation Age of Onset  . Heart disease Father   . Diabetes Paternal Uncle    Social History   Tobacco Use  . Smoking status: Never Smoker  . Smokeless tobacco: Current User    Types: Chew  Substance Use Topics  . Alcohol use: Yes    Alcohol/week: 0.0 standard drinks    Comment: weekly  . Drug use: No   Current Outpatient Medications  Medication Sig Dispense Refill  . Alcohol Swabs (ALCOHOL PREP) PADS Use to clean skin and vials for testosterone injections. 100 each prn  . AMBULATORY NON FORMULARY MEDICATION Please set CPAP to 13 cm of water.  15-minute ramp.  Adjust mask fit.  Face-to-face note will be attached as well. 1 each 0  . amLODipine (NORVASC) 10 MG tablet TAKE 1 TABLET (10 MG TOTAL) BY MOUTH DAILY. APPOINTMENT NEEDED FOR FURTHER REFILLS 30 tablet 0  . atorvastatin (LIPITOR) 80 MG tablet TAKE 1 TABLET BY MOUTH EVERY DAY 90 tablet 1  . BD HYPODERMIC NEEDLE 18G X 1" MISC USE TO DRAW UP TESTOSTERONE WEEKLY 25 each prn  . Cyanocobalamin (B-12) 5000 MCG SUBL Place 1 tablet under the tongue daily. 90 tablet 1  . cyclobenzaprine (FLEXERIL) 10 MG tablet TAKE 1 TABLET BY MOUTH TWICE A DAY AS NEEDED FOR MUSCLE SPASMS 60 tablet 3  . docusate sodium (COLACE) 100 MG capsule  Take 1 capsule (100 mg total) by mouth every 12 (twelve) hours. 60 capsule 0  . fluconazole (DIFLUCAN) 150 MG tablet Take 2 tablets (300 mg total) by mouth once a week. 8 tablet 1  . lidocaine (XYLOCAINE) 2 % jelly Apply 1 application topically as needed. 30 mL 0  . linaclotide (LINZESS) 72 MCG capsule Take 1 capsule (72 mcg total) by mouth daily before breakfast. 90 capsule 1  . lisinopril (PRINIVIL,ZESTRIL) 40 MG tablet Take 1 tablet (40 mg total) by mouth daily. 90 tablet 3  . NEEDLE, DISP, 18 G (B-D BLUNT FILL NEEDLE) 18G X 1-1/2" MISC Use to draw up testosterone weekly 50 each 0  .  Nitroglycerin 0.4 % OINT Place 1 inch rectally 2 (two) times daily. 30 g 0  . polyethylene glycol (MIRALAX / GLYCOLAX) packet Take 17 g by mouth daily. 14 each 0  . predniSONE (DELTASONE) 50 MG tablet Take 1 tablet (50 mg total) by mouth daily. 5 tablet 0  . SYRINGE-NEEDLE, DISP, 3 ML (B-D 3CC LUER-LOK SYR 22GX1-1/2) 22G X 1-1/2" 3 ML MISC USE TO ADMINISTER TESTOSTERONE WEEKLY 50 each 1  . testosterone cypionate (DEPOTESTOSTERONE CYPIONATE) 200 MG/ML injection INJECT 0.5 MLS (100 MG TOTAL) INTO THE MUSCLE ONCE A WEEK. NEED LABS AND FOLLOW UP APPOINTMENT 10 mL 0  . triamterene-hydrochlorothiazide (MAXZIDE-25) 37.5-25 MG tablet Take 1 tablet by mouth daily. 90 tablet 3  . Vitamin D, Ergocalciferol, (DRISDOL) 50000 units CAPS capsule TAKE 1 CAPSULE (50,000 UNITS TOTAL) BY MOUTH EVERY 7 (SEVEN) DAYS. TAKE FOR 8 TOTAL WEEKS 8 capsule 0   No current facility-administered medications for this visit.    No Known Allergies   Review of Systems: All systems reviewed and negative except where noted in HPI.     Physical Exam:    Wt Readings from Last 3 Encounters:  01/22/19 288 lb 8 oz (130.9 kg)  01/04/19 282 lb (127.9 kg)  10/23/18 287 lb (130.2 kg)    Ht 6' (1.829 m)   Wt 288 lb 8 oz (130.9 kg)   BMI 39.13 kg/m  Constitutional:  Pleasant, in no acute distress. Psychiatric: Normal mood and affect. Behavior is normal. EENT: Pupils normal.  Conjunctivae are normal. No scleral icterus. Neck supple. No cervical LAD. Cardiovascular: Normal rate, regular rhythm. No edema Pulmonary/chest: Effort normal and breath sounds normal. No wheezing, rales or rhonchi. Abdominal: Soft, nondistended, nontender. Bowel sounds active throughout. There are no masses palpable. No hepatomegaly. Neurological: Alert and oriented to person place and time. Skin: Skin is warm and dry. No rashes noted. Rectal exam: Sensation intact and preserved anal wink.  Medium sized external anal fissure in the 12 o'clock position.   No external hemorrhoids or skin tags.  No fluctuance.  No digital rectal exam performed to tenderness from the fissure.  (Patient was offered a chaperone for this sensitive examination, but declined).    ASSESSMENT AND PLAN;   Dylan Tansil. is a 43 y.o. male presenting with:  1) Anal fissure: External anal fissure in the 12 o'clock position.  Has not trialed any medications as previously prescribed by the ER.  Will treat as below:  - Start topical NTG 0.125%, apply a small, pea-sized amount to the affected area BID for 6-8 weeks.  - We discussed the ADR of headache, and if patient does experience, to call me and will change and make pharmacy request to compound topical CCB (topical nifedipine 0.2-0.3% applied 2-4 times daily; unfortunately, the CCB also carries an ADR of  headache in 5-12%). Additionally, cautioned to avoid strenuous activity within 30 minutes of application -Topical lidocaine for as needed use - Resume Colace 100 mg p.o. twice daily for goal of soft stools without straining - Start fiber supplement for a goal of regular, soft, stools without straining to have a bowel movement. If unable to achieve with fiber alone, or if sxs are exacerbated with fiber, can consider starting oral laxative agent (ie, Miralax) to improve stool consistency and aid in fissure healing  - Sitz bath with warm water for 10-15 minutes 2-3 times daily; directed to [employer] bath kits available online or at [employer] retail pharmacies; ensure to dry area afterwards - If no improvement with appropriate trial of therapy, will either change meds or can consider surgical evaluation -Given duration of symptoms and associated change in bowel habits, reasonable to proceed with colonoscopy to evaluate for IBD.  We will not schedule for at least 1 month allowing for fissure healing  2) Constipation/Straining: Straining to have BM for approximately 1 year, with exacerbation of symptoms over the last couple of months.   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  Discussed this at length today and will evaluate as below:  - Stool softener and fiber as above -Encouraged to continue to drink at least eight 8 ounce glasses of water per day -Colonoscopy to evaluate for luminal etiology - Depending on colonoscopy findings and response to therapy as above, will likely send for ARM after improvement of the fissure  3) Hematochezia: As above, suspect secondary to anorectal disease (i.e. fissure).  However, given patient concerns with associated changes in bowel habits, can certainly evaluate for more proximal etiology at time of colonoscopy as above.  In the meantime, to treat as above  - To f/u in the clinic in 3 months or sooner prn  The indications, risks, and benefits of colonoscopy were explained to the patient in detail. Risks include but are not limited to bleeding, perforation, adverse reaction to medications, and cardiopulmonary compromise. Sequelae include but are not limited to the possibility of surgery, hospitalization, and mortality. The patient verbalized understanding and wished to proceed. All questions answered, referred to the scheduler and bowel prep ordered. Further recommendations pending results of the exam.   Shellia CleverlyVito V Melena Hayes, DO, FACG  01/22/2019, 9:40 AM   Rodolph Bongorey, Evan S, MD

## 2019-01-26 ENCOUNTER — Other Ambulatory Visit: Payer: Self-pay | Admitting: Family Medicine

## 2019-01-26 DIAGNOSIS — E349 Endocrine disorder, unspecified: Secondary | ICD-10-CM

## 2019-02-17 ENCOUNTER — Telehealth: Payer: Self-pay | Admitting: Gastroenterology

## 2019-02-17 MED ORDER — LIDOCAINE HCL URETHRAL/MUCOSAL 2 % EX GEL: 1.0000 "application " | Freq: Two times a day (BID) | CUTANEOUS | 3 refills | Status: DC

## 2019-02-17 MED ORDER — DOCUSATE SODIUM 100 MG PO CAPS: 100.0000 mg | ORAL_CAPSULE | Freq: Two times a day (BID) | ORAL | 4 refills | Status: DC

## 2019-02-17 NOTE — Telephone Encounter (Signed)
Patient called asking for prescriptions to be sent for Colace and for Lidocaine Jelly. I have prescribed those and sent to Pharmacy. I will relay to Dr. Barron Alvine.  Corliss Parish, MD [facility] Office # [phone]

## 2019-02-19 ENCOUNTER — Other Ambulatory Visit: Payer: Self-pay | Admitting: Family Medicine

## 2019-02-26 ENCOUNTER — Encounter: Payer: Self-pay | Admitting: Gastroenterology

## 2019-03-12 ENCOUNTER — Encounter: Payer: Managed Care, Other (non HMO) | Admitting: Gastroenterology

## 2019-03-18 ENCOUNTER — Other Ambulatory Visit: Payer: Self-pay | Admitting: Family Medicine

## 2019-03-19 ENCOUNTER — Other Ambulatory Visit: Payer: Self-pay | Admitting: Family Medicine

## 2019-04-01 ENCOUNTER — Other Ambulatory Visit: Payer: Self-pay | Admitting: Family Medicine

## 2019-04-10 ENCOUNTER — Other Ambulatory Visit: Payer: Self-pay | Admitting: Family Medicine

## 2019-04-12 NOTE — Telephone Encounter (Signed)
Will forward to provider for review.

## 2019-04-14 DIAGNOSIS — K859 Acute pancreatitis without necrosis or infection, unspecified: Secondary | ICD-10-CM | POA: Insufficient documentation

## 2019-04-20 ENCOUNTER — Telehealth: Payer: Self-pay | Admitting: Family Medicine

## 2019-04-20 NOTE — Telephone Encounter (Signed)
Dylan Munoz, pts case manager, called to let us know that pt was recently in hospital for pancreatitis. Pt is now at home.Marland Kitchen She will be his short term Printmaker.

## 2019-04-21 NOTE — Telephone Encounter (Signed)
Will forward to scheduler to call and schedule hospital follow up.

## 2019-04-21 NOTE — Telephone Encounter (Signed)
Left pt VM to schedule a HFU with Dr.Corey

## 2019-04-21 NOTE — Telephone Encounter (Signed)
Patient should schedule with me in the near future.

## 2019-04-22 ENCOUNTER — Encounter: Payer: Self-pay | Admitting: Family Medicine

## 2019-04-22 ENCOUNTER — Ambulatory Visit (INDEPENDENT_AMBULATORY_CARE_PROVIDER_SITE_OTHER): Payer: Managed Care, Other (non HMO) | Admitting: Family Medicine

## 2019-04-22 VITALS — BP 127/83 | HR 87 | Temp 99.0°F | Wt 293.0 lb

## 2019-04-22 DIAGNOSIS — I10 Essential (primary) hypertension: Secondary | ICD-10-CM

## 2019-04-22 DIAGNOSIS — B360 Pityriasis versicolor: Secondary | ICD-10-CM

## 2019-04-22 DIAGNOSIS — E349 Endocrine disorder, unspecified: Secondary | ICD-10-CM | POA: Diagnosis not present

## 2019-04-22 DIAGNOSIS — E782 Mixed hyperlipidemia: Secondary | ICD-10-CM

## 2019-04-22 MED ORDER — TESTOSTERONE CYPIONATE 200 MG/ML IM SOLN: 100.0000 mg | INTRAMUSCULAR | 0 refills | Status: DC

## 2019-04-22 MED ORDER — FLUCONAZOLE 150 MG PO TABS: 300.0000 mg | ORAL_TABLET | ORAL | 1 refills | Status: DC

## 2019-04-22 NOTE — Progress Notes (Signed)
Dylan Munoz. is a 43 y.o. male who presents to [name]: Primary Care Sports Medicine today for pancreatitis.   Hospital follow-up.  Patient was admitted to the hospital on April 29 and discharged on May 1.  He was was diagnosed with acute pancreatitis with elevated amylase and CT changes consistent with pancreatitis.  Treated with clear diet and tolerated and was discharged after being able to tolerate a normal diet.  He notes that prior to admission he had consumed 2 pints of whiskey during the day on Sunday that was thought to precipitate this episode of pancreatitis.  Lipase was 161 on admission and trended down to 111 on May 1 discharge.  He notes he is completely asymptomatic now with no abdominal pain.  He has started a plant-based diet and eliminated alcohol and feels much better.  Hypertension: Patient notes that he is only taking the lisinopril hydrochlorothiazide.  He has discontinued the amlodipine because it caused constipation.  With the improved diet he is feeling pretty well.  Additionally he notes he has returning Tinea Versicolor rash.  He like a refill of the fluconazole if possible.  ROS as above:  Exam:  BP 127/83   Pulse 87   Temp 99 F (37.2 C) (Oral)   Wt 293 lb (132.9 kg)   BMI 39.74 kg/m  Wt Readings from Last 5 Encounters:  04/22/19 293 lb (132.9 kg)  01/22/19 288 lb 8 oz (130.9 kg)  01/04/19 282 lb (127.9 kg)  10/23/18 287 lb (130.2 kg)  09/25/18 (!) 302 lb (137 kg)    Gen: Well NAD HEENT: EOMI,  MMM Lungs: Normal work of breathing. CTABL Heart: RRR no MRG Abd: NABS, Soft. Nondistended, Nontender Exts: Brisk capillary refill, warm and well perfused.  Skin: Hypopigmented macular rash extremities and trunk slight scale present. Lab and Radiology Results  Lab Results  Component Value Date/Time  WBC 6.2 04/16/2019 0058  RBC 3.94 (L) 04/16/2019  0058  HGB 13.5 04/16/2019 0058  HCT 38.4 (L) 04/16/2019 0058  PLT 254 04/16/2019 0058   Lab Results  Component Value Date  WBC 6.2 04/16/2019  HGB 13.5 04/16/2019  HCT 38.4 (L) 04/16/2019  PLT 254 04/16/2019   Lab Results  Component Value Date  CO2 27 04/16/2019  BUN 10 04/16/2019  CREATININE 1.07 04/16/2019  CALCIUM 8.8 04/16/2019  ALBUMIN 4.5 04/14/2019  AST 13 04/14/2019  ALT 20 04/14/2019   Lab Results  Component Value Date  NA 135 04/16/2019  K 4.0 04/16/2019  CL 101 04/16/2019  CO2 27 04/16/2019  BUN 10 04/16/2019  CREATININE 1.07 04/16/2019  CALCIUM 8.8 04/16/2019   Lab Results  Component Value Date  ALKPHOS 86 04/14/2019  BILITOT 1.4 (H) 04/14/2019  PROT 8.1 04/14/2019  ALBUMIN 4.5 04/14/2019  ALT 20 04/14/2019  AST 13 04/14/2019   No results found for: INR, APTT Hospital Radiology:  Ct Stone Study - Abdomen Pelvis Wo  Result Date: 04/14/2019 CLINICAL DATA: Abdominal pain, recent hematochezia; past medical history of hypertension, testosterone deficiency, anal fissure, constipation EXAM: CT ABDOMEN AND PELVIS WITHOUT CONTRAST TECHNIQUE:  Multidetector CT imaging of the abdomen and pelvis was performed following the standard protocol without IV contrast. COMPARISON: 01/04/2019 FINDINGS: Lower chest: No pleural or pericardial effusion. Visualized lung bases clear. Hepatobiliary: No focal liver abnormality is seen. No gallstones, gallbladder wall thickening, or biliary dilatation. Pancreas: Mild inflammatory changes around the pancreatic head and uncinate process, new since previous. No mass or ductal dilatation. Spleen: Normal in size without focal abnormality. Adrenals/Urinary Tract: Adrenal glands are unremarkable. Kidneys are normal, without renal calculi, focal lesion, or hydronephrosis. Bladder is unremarkable. Stomach/Bowel: Stomach and small bowel are decompressed. Normal appendix. The colon is nondilated with a few scattered distal descending and sigmoid  diverticula; no significant adjacent inflammatory/edematous change or abscess. Vascular/Lymphatic: Moderate aortoiliac calcified plaque without aneurysm. No abdominal or pelvic adenopathy identified. Reproductive: Prominent prostate. Other: No ascites. No free air. Musculoskeletal: Bilateral hip DJD. Mild spondylitic changes in the lower thoracic and lumbar spine. Negative for fracture or worrisome bone lesion. IMPRESSION: 1. Mild inflammatory/edematous changes around the pancreatic head and uncinate process suggesting acute pancreatitis. 2. Scattered descending and sigmoid     Assessment and Plan: 44 y.o. male with pancreatitis: Much improved.  Stressed the importance of good low-fat diet as well as reduced alcohol Consumption.  Plan for watchful waiting and recheck in about 3 months.  Hypertension: Blood pressure much better controlled.  Patient has discontinued amlodipine.  Plan to continue current medical regimen and recheck in near future.  Continue low-salt low-fat diet.  Rash: Tinea Versicolor refill fluconazole.  PDMP not reviewed this encounter. No orders of the defined types were placed in this encounter.  Meds ordered this encounter  Medications  . fluconazole (DIFLUCAN) 150 MG tablet    Sig: Take 2 tablets (300 mg total) by mouth once a week.    Dispense:  8 tablet    Refill:  1  . testosterone cypionate (DEPOTESTOSTERONE CYPIONATE) 200 MG/ML injection    Sig: Inject 0.5 mLs (100 mg total) into the muscle once a week.    Dispense:  10 mL    Refill:  0    Not to exceed 5 additional fills before 04/11/2019     Historical information moved to improve visibility of documentation.  Past Medical History:  Diagnosis Date  . High cholesterol   . Hypertension   . Morbid obesity (HCC) 04/17/2016  . Severe obstructive sleep apnea 04/17/2016   AHI 14.9 with desats <88% for >90 mins on SNAP report 04/02/18. See scanned document.  . Testosterone deficiency 04/19/2016  . Vitamin D  deficiency 04/19/2016   Past Surgical History:  Procedure Laterality Date  . KNEE ARTHROSCOPY W/ ACL RECONSTRUCTION     Social History   Tobacco Use  . Smoking status: Never Smoker  . Smokeless tobacco: Current User    Types: Chew  Substance Use Topics  . Alcohol use: Yes    Alcohol/week: 0.0 standard drinks    Comment: weekly   family history includes Diabetes in his paternal uncle; Heart disease in his father.  Medications: Current Outpatient Medications  Medication Sig Dispense Refill  . Alcohol Swabs (ALCOHOL PREP) PADS Use to clean skin and vials for testosterone injections. 100 each prn  . AMBULATORY NON FORMULARY MEDICATION Please set CPAP to 13 cm of water.  15-minute ramp.  Adjust mask fit.  Face-to-face note will be attached as well. 1 each 0  . atorvastatin (LIPITOR) 80 MG tablet TAKE 1 TABLET BY MOUTH EVERY DAY 90 tablet 0  . BD HYPODERMIC NEEDLE 18G X  1" MISC USE TO DRAW UP TESTOSTERONE WEEKLY 25 each prn  . Cyanocobalamin (B-12) 5000 MCG SUBL Place 1 tablet under the tongue daily. 90 tablet 1  . fluconazole (DIFLUCAN) 150 MG tablet Take 2 tablets (300 mg total) by mouth once a week. 8 tablet 1  . lidocaine (XYLOCAINE) 2 % jelly Apply 1 application topically 2 (two) times daily. 30 mL 3  . lisinopril-hydrochlorothiazide (PRINZIDE,ZESTORETIC) 20-25 MG tablet TAKE 1 TABLET BY MOUTH EVERY DAY 90 tablet 1  . NEEDLE, DISP, 18 G (B-D BLUNT FILL NEEDLE) 18G X 1-1/2" MISC Use to draw up testosterone weekly 50 each 0  . SYRINGE-NEEDLE, DISP, 3 ML (B-D 3CC LUER-LOK SYR 22GX1-1/2) 22G X 1-1/2" 3 ML MISC USE TO ADMINISTER TESTOSTERONE WEEKLY 50 each 1  . testosterone cypionate (DEPOTESTOSTERONE CYPIONATE) 200 MG/ML injection Inject 0.5 mLs (100 mg total) into the muscle once a week. 10 mL 0  . Vitamin D, Ergocalciferol, (DRISDOL) 1.25 MG (50000 UT) CAPS capsule TAKE 1 CAPSULE (50,000 UNITS TOTAL) BY MOUTH EVERY 7 (SEVEN) DAYS. TAKE FOR 8 TOTAL WEEKS 8 capsule 0   No current  facility-administered medications for this visit.    Allergies  Allergen Reactions  . Amlodipine Other (See Comments)    Constipation     Discussed warning signs or symptoms. Please see discharge instructions. Patient expresses understanding.

## 2019-04-22 NOTE — Patient Instructions (Addendum)
Thank you for coming in today.  Continue current medicines.  Continue healthy diet.  This iwll help the cholesterol, blood pressure and pancretitis.  Keep alcohol low.  Recheck with me in 3 months or sooner if needed for blood pressure.  We will want to get fasting labs in 3 months or so.   Can consider mag oxide OTC.  Vitamins are avaliable OTC.

## 2019-06-05 ENCOUNTER — Other Ambulatory Visit: Payer: Self-pay | Admitting: Family Medicine

## 2019-06-09 MED ORDER — VITAMIN D (ERGOCALCIFEROL) 1.25 MG (50000 UNIT) PO CAPS: 50000.0000 [IU] | ORAL_CAPSULE | ORAL | 0 refills | Status: DC

## 2019-06-22 ENCOUNTER — Other Ambulatory Visit: Payer: Self-pay | Admitting: Family Medicine

## 2019-07-02 ENCOUNTER — Other Ambulatory Visit: Payer: Self-pay | Admitting: Family Medicine

## 2019-07-03 ENCOUNTER — Other Ambulatory Visit: Payer: Self-pay | Admitting: Family Medicine

## 2019-07-04 DIAGNOSIS — F1010 Alcohol abuse, uncomplicated: Secondary | ICD-10-CM | POA: Insufficient documentation

## 2019-07-06 ENCOUNTER — Other Ambulatory Visit: Payer: Self-pay | Admitting: Family Medicine

## 2019-07-06 MED ORDER — ATORVASTATIN CALCIUM 80 MG PO TABS: 80.0000 mg | ORAL_TABLET | Freq: Every day | ORAL | 0 refills | Status: DC

## 2019-07-16 ENCOUNTER — Other Ambulatory Visit: Payer: Self-pay | Admitting: Family Medicine

## 2019-07-16 DIAGNOSIS — E349 Endocrine disorder, unspecified: Secondary | ICD-10-CM

## 2019-07-16 NOTE — Telephone Encounter (Signed)
Please call patient.  He really needs to have follow-up lab work performed.  He has been on the vitamin D for at least 6 months and so really needs to have his levels rechecked to see if he still needs the really high dose vitamin D or if he is able to just transition to maintenance dosing.  So he really needs to have that checked before we refill it.  In addition his testosterone was filled about 8 weeks ago in which case he should still have enough for 2 more weeks.  I just want a make sure that he is not taking too much.  Per Dr. Clovis Riley note he is actually supposed to follow-up around the first or second week of August for his 37-month follow-up and labs.  So just encouraged him to schedule that.

## 2019-07-19 MED ORDER — VITAMIN D (ERGOCALCIFEROL) 1.25 MG (50000 UNIT) PO CAPS: 50000.0000 [IU] | ORAL_CAPSULE | ORAL | 0 refills | Status: DC

## 2019-07-19 MED ORDER — TESTOSTERONE CYPIONATE 200 MG/ML IM SOLN: 100.0000 mg | INTRAMUSCULAR | 0 refills | Status: DC

## 2019-07-19 NOTE — Telephone Encounter (Signed)
Refilled will address on August 16.

## 2019-07-19 NOTE — Telephone Encounter (Signed)
He is coming into see Dr. Georgina Snell on August 16th. Please send refill if ok. KG LPN

## 2019-07-20 ENCOUNTER — Encounter: Payer: Self-pay | Admitting: Family Medicine

## 2019-07-23 ENCOUNTER — Ambulatory Visit: Payer: Managed Care, Other (non HMO) | Admitting: Family Medicine

## 2019-07-30 ENCOUNTER — Other Ambulatory Visit: Payer: Self-pay

## 2019-07-30 ENCOUNTER — Encounter: Payer: Self-pay | Admitting: Family Medicine

## 2019-07-30 ENCOUNTER — Other Ambulatory Visit: Payer: Self-pay | Admitting: Family Medicine

## 2019-07-30 ENCOUNTER — Ambulatory Visit: Payer: Managed Care, Other (non HMO) | Admitting: Family Medicine

## 2019-07-30 VITALS — BP 113/73 | HR 91 | Temp 98.6°F | Wt 295.5 lb

## 2019-07-30 DIAGNOSIS — E349 Endocrine disorder, unspecified: Secondary | ICD-10-CM | POA: Diagnosis not present

## 2019-07-30 DIAGNOSIS — E538 Deficiency of other specified B group vitamins: Secondary | ICD-10-CM | POA: Diagnosis not present

## 2019-07-30 DIAGNOSIS — I10 Essential (primary) hypertension: Secondary | ICD-10-CM

## 2019-07-30 DIAGNOSIS — E782 Mixed hyperlipidemia: Secondary | ICD-10-CM

## 2019-07-30 DIAGNOSIS — Z5181 Encounter for therapeutic drug level monitoring: Secondary | ICD-10-CM

## 2019-07-30 DIAGNOSIS — A048 Other specified bacterial intestinal infections: Secondary | ICD-10-CM | POA: Diagnosis not present

## 2019-07-30 DIAGNOSIS — G4733 Obstructive sleep apnea (adult) (pediatric): Secondary | ICD-10-CM

## 2019-07-30 MED ORDER — TRIAMCINOLONE ACETONIDE 0.1 % EX CREA: 1.0000 "application " | TOPICAL_CREAM | Freq: Two times a day (BID) | CUTANEOUS | 12 refills | Status: DC

## 2019-07-30 MED ORDER — TETRACYCLINE HCL 500 MG PO CAPS: 500.0000 mg | ORAL_CAPSULE | Freq: Three times a day (TID) | ORAL | 0 refills | Status: DC

## 2019-07-30 MED ORDER — METRONIDAZOLE 500 MG PO TABS: 500.0000 mg | ORAL_TABLET | Freq: Three times a day (TID) | ORAL | 0 refills | Status: AC

## 2019-07-30 MED ORDER — AMBULATORY NON FORMULARY MEDICATION: 0 refills | Status: AC

## 2019-07-30 MED ORDER — BISMUTH SUBSALICYLATE 262 MG PO CHEW: 524.0000 mg | CHEWABLE_TABLET | Freq: Three times a day (TID) | ORAL | 0 refills | Status: DC

## 2019-07-30 NOTE — Progress Notes (Signed)
Note duplication 

## 2019-07-30 NOTE — Patient Instructions (Addendum)
Thank you for coming in today. Get labs now.  If the tesotsterone is low I can adjust the medicine dose.   We will also adjust the cpap.   If all is well recheck in 6 months   I will be moving to full time Sports Medicine in [place] starting on November 1st.  You will still be able to see me for your Sports Medicine or Orthopedic needs in the [facility] Location. I will still be part of [place] [facility].    [address], [place] [address]  [phone]  Telephone (phone line will be functional starting in November).  [phone] Fax [phone] Concussion Line  If you want to stay locally for your Sports Medicine issues Dr. Dianah Field here in [place] will be happy to see you.  Additionally Dr. Clearance Coots at [facility] will be happy to see you for sports medicine issues more locally.   For your primary care needs you are welcome to establish care with Dr. Emeterio Reeve.  We are working quickly to hire more physicians to cover the primary care needs however if you cannot get an appointment with Dr. Sheppard Coil in a timely manner [facility] has locations and openings for primary care services nearby.   [facility] at [facility] [address] . [place] , [place]: [phone] . Behavioral Medicine: [phone] . Fax: [place] at [employer] [address] . [place], [place]: [phone] . Behavioral Medicine: [phone] . Fax: [phone] . Hours (M-F): 7am - Academic librarian At [facility]. [place], [place]: [phone] . Behavioral Medicine: [phone] . Fax: [phone] . Hours (M-F): 8am - Optician, dispensing at [employer] . [place], [place] Phone: [phone] . Behavioral Medicine: [phone] . Fax:  [phone]

## 2019-07-30 NOTE — Progress Notes (Signed)
Dylan Munoz. is a 43 y.o. male who presents to [place] today for hypertension H. pylori alcoholic pancreatitis  Hypertension: Currently taking lisinopril/hydrochlorothiazide 20/25.  Patient had been on amlodipine but it was discontinued due to constipation.  Blood pressure was controlled at last visit on May 7.  In the interim he notes that he is doing well.  Blood pressure is controlled.  No chest pain palpitations shortness of breath.  Testosterone: Patient is a history of low testosterone currently managed with testosterone injections.  He notes he is feeling a bit more fatigued and thinks his levels may be too low.  Patient had been hospitalized in late April for pancreatitis thought to be due to alcohol use.  He had significant improvement when rechecked on May 7.  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  He is feeling a lot better now but had follow-up EGD the showed H. pylori.  He was prescribed combo quadruple therapy pill but has not been able to get of the pharmacy because is unavailable. He is currently taking omeprazole 20 mg twice daily.  Addend above he also notes fatigue.  He does have CPAP and thinks the settings are inappropriate.  He is currently on 13 cm of water.  He feels as though it is not working as well as it should.  He gets his CPAP supplies through [facility].  ROS as above:  Exam:  BP 113/73 (BP Location: Left Arm, Patient Position: Sitting, Cuff Size: Large)   Pulse 91   Temp 98.6 F (37 C) (Oral)   Wt 295 lb 8 oz (134 kg)   BMI 40.08 kg/m  Wt Readings from Last 5 Encounters:  07/30/19 295 lb 8 oz (134 kg)  04/22/19 293 lb (132.9 kg)  01/22/19 288 lb 8 oz (130.9 kg)  01/04/19 282 lb (127.9 kg)  10/23/18 287 lb (130.2 kg)    Gen: Well NAD HEENT: EOMI,  MMM Lungs: Normal work of breathing. CTABL Heart:  RRR no MRG Abd: NABS, Soft. Nondistended, Nontender Exts: Brisk capillary refill, warm and well perfused.   Lab and Radiology Results Lab Results  Component Value Date   TESTOSTERONE 510 08/07/2018     Chemistry     Care Everywhere Result Report Lipid ProfileResulted: 04/14/2019 9:09 AM [facility] Component Name Value Ref Range  Total Cholesterol 165 25 - 199 MG/DL  Triglycerides 163 (H) 10 - 150 MG/DL  HDL Cholesterol 52 35 - 135 MG/DL  LDL Cholesterol 80  Comment: ATP III Classification (LDL):  <100    mg/dL     Optimal  100 - 129  mg/dL     Near or Above Optimal  130 - 159  mg/dL     Borderline High  160-189   mg/dL     High  >190    mg/dL     Very High   ATP III Classification (LDL):    < 100    mg/dL   Optimal   100 - 129  mg/dL   Near or Above Optimal   130 - 159  mg/dL   Borderline High   160 - 189  mg/dL   High    > 190    mg/dL   Very High  MG/DL  Total Chol / HDL Cholesterol 3.2  Comment: Total Cholesterol/HDL Ratio: CHD Risk  Coronary Heart Disease Risk Table  Men   Women  1/2 Average Risk  3.4   3.3    Average Risk  5.0   4.4  2 X Average Risk  9.6   7.1  3 X Average Risk  23.4  11.0  Use the calculated Patient Ratio above and the CHD Risk table to determine the patient's CHD Risk.   <4.5   Non-HDL Cholesterol 113 MG/DL  Specimen Collected on  Blood 04/14/2019 7:09 AM     Assessment and Plan: 43 y.o. male with  Hypertension: Blood pressure controlled.  Plan to continue current regimen and check metabolic panel.  Testosterone: Symptomatic somewhat.  Plan to recheck testosterone.  If low will increase dose.  H. pylori: We will prescribe the combo triple therapy individually of this month Flagyl and tetracycline.  Encourage continued use of omeprazole.  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   Patient is currently abstinent at this time.  Fatigue: Suspect CPAP settings are not quite high enough.  Plan to send new order to adjust CPAP settings from auto titration 10-20 cm of water.  We will also get 30-day compliance report 1 month after changing settings.  Will adjust settings from there and reassess.   If all is well recheck in 6 months.  Patient will need to establish with new PCP since I am transitioning to sports medicine only.  Patient expresses understanding and agreement.  PDMP not reviewed this encounter. Orders Placed This Encounter  Procedures  . CBC  . COMPLETE METABOLIC PANEL WITH GFR  . Testosterone  . PSA   Meds ordered this encounter  Medications  . bismuth subsalicylate (PINK BISMUTH) 262 MG chewable tablet    Sig: Chew 2 tablets (524 mg total) by mouth 3 (three) times daily.    Dispense:  30 tablet    Refill:  0  . metroNIDAZOLE (FLAGYL) 500 MG tablet    Sig: Take 1 tablet (500 mg total) by mouth 3 (three) times daily for 7 days.    Dispense:  30 tablet    Refill:  0  . tetracycline (SUMYCIN) 500 MG capsule    Sig: Take 1 capsule (500 mg total) by mouth 3 (three) times daily.    Dispense:  30 capsule    Refill:  0  . triamcinolone cream (KENALOG) 0.1 %    Sig: Apply 1 application topically 2 (two) times daily. Apply to rash    Dispense:  453.6 g    Refill:  12  . AMBULATORY NON FORMULARY MEDICATION    Sig: Please set CPAP to auto-titration from 10-20.  \  Adjust mask fit.  Send 30 day compliance report 30 days after change is made.   Send to [facility]    Dispense:  1 each    Refill:  0     Historical information moved to improve visibility of documentation.  Past Medical History:  Diagnosis Date  . High cholesterol   . Hypertension   . Morbid obesity (HCC) 04/17/2016  . Severe obstructive sleep apnea 04/17/2016   AHI 14.9 with desats <88% for >90 mins on SNAP report 04/02/18. See scanned document.  . Testosterone deficiency 04/19/2016  . Vitamin  D deficiency 04/19/2016   Past Surgical History:  Procedure Laterality Date  . KNEE ARTHROSCOPY W/ ACL RECONSTRUCTION     Social History   Tobacco Use  . Smoking status: Never Smoker  . Smokeless tobacco: Current User    Types: Chew  Substance Use Topics  . Alcohol use: Yes  Alcohol/week: 0.0 standard drinks    Comment: weekly   family history includes Diabetes in his paternal uncle; Heart disease in his father.  Medications: Current Outpatient Medications  Medication Sig Dispense Refill  . folic acid (FOLVITE) 1 MG tablet Take by mouth.    . Alcohol Swabs (ALCOHOL PREP) PADS Use to clean skin and vials for testosterone injections. 100 each prn  . AMBULATORY NON FORMULARY MEDICATION Please set CPAP to auto-titration from 10-20.  \  Adjust mask fit.  Send 30 day compliance report 30 days after change is made.   Send to [facility] 1 each 0  . atorvastatin (LIPITOR) 80 MG tablet Take 1 tablet (80 mg total) by mouth daily. Due for follow up visit w/PCP 30 tablet 0  . BD HYPODERMIC NEEDLE 18G X 1" MISC USE TO DRAW UP TESTOSTERONE WEEKLY 25 each prn  . bismuth subsalicylate (PINK BISMUTH) 262 MG chewable tablet Chew 2 tablets (524 mg total) by mouth 3 (three) times daily. 30 tablet 0  . Cyanocobalamin (B-12) 5000 MCG SUBL Place 1 tablet under the tongue daily. 90 tablet 1  . fluconazole (DIFLUCAN) 150 MG tablet TAKE 2 TABLETS (300 MG TOTAL) BY MOUTH ONCE A WEEK. 8 tablet 1  . lisinopril-hydrochlorothiazide (PRINZIDE,ZESTORETIC) 20-25 MG tablet TAKE 1 TABLET BY MOUTH EVERY DAY 90 tablet 1  . metroNIDAZOLE (FLAGYL) 500 MG tablet Take 1 tablet (500 mg total) by mouth 3 (three) times daily for 7 days. 30 tablet 0  . NEEDLE, DISP, 18 G (B-D BLUNT FILL NEEDLE) 18G X 1-1/2" MISC Use to draw up testosterone weekly 50 each 0  . omeprazole (PRILOSEC) 20 MG capsule Take 1 capsule by mouth 2 (two) times daily.    . SYRINGE-NEEDLE, DISP, 3 ML (B-D 3CC LUER-LOK SYR 22GX1-1/2) 22G X 1-1/2" 3 ML MISC USE  TO ADMINISTER TESTOSTERONE WEEKLY 50 each 1  . testosterone cypionate (DEPOTESTOSTERONE CYPIONATE) 200 MG/ML injection Inject 0.5 mLs (100 mg total) into the muscle once a week. 10 mL 0  . tetracycline (SUMYCIN) 500 MG capsule Take 1 capsule (500 mg total) by mouth 3 (three) times daily. 30 capsule 0  . triamcinolone cream (KENALOG) 0.1 % Apply 1 application topically 2 (two) times daily. Apply to rash 453.6 g 12  . Vitamin D, Ergocalciferol, (DRISDOL) 1.25 MG (50000 UT) CAPS capsule Take 1 capsule (50,000 Units total) by mouth every 7 (seven) days. 8 capsule 0   No current facility-administered medications for this visit.    Allergies  Allergen Reactions  . Amlodipine Other (See Comments)    Constipation     Discussed warning signs or symptoms. Please see discharge instructions. Patient expresses understanding.

## 2019-07-31 LAB — COMPLETE METABOLIC PANEL WITH GFR
AG Ratio: 1.5 (calc) (ref 1.0–2.5)
ALT: 31 U/L (ref 9–46)
AST: 16 U/L (ref 10–40)
Albumin: 4.2 g/dL (ref 3.6–5.1)
Alkaline phosphatase (APISO): 78 U/L (ref 36–130)
BUN: 14 mg/dL (ref 7–25)
CO2: 28 mmol/L (ref 20–32)
Calcium: 9.8 mg/dL (ref 8.6–10.3)
Chloride: 99 mmol/L (ref 98–110)
Creat: 1.32 mg/dL (ref 0.60–1.35)
GFR, Est African American: 76 mL/min/{1.73_m2} (ref >=60)
GFR, Est Non African American: 66 mL/min/{1.73_m2} (ref >=60)
Globulin: 2.8 g/dL (calc) (ref 1.9–3.7)
Glucose, Bld: 108 mg/dL — ABNORMAL HIGH (ref 65–99)
Potassium: 4.5 mmol/L (ref 3.5–5.3)
Sodium: 138 mmol/L (ref 135–146)
Total Bilirubin: 0.6 mg/dL (ref 0.2–1.2)
Total Protein: 7 g/dL (ref 6.1–8.1)

## 2019-07-31 LAB — CBC
HCT: 44.8 % (ref 38.5–50.0)
Hemoglobin: 15.2 g/dL (ref 13.2–17.1)
MCH: 30.6 pg (ref 27.0–33.0)
MCHC: 33.9 g/dL (ref 32.0–36.0)
MCV: 90.3 fL (ref 80.0–100.0)
MPV: 10.4 fL (ref 7.5–12.5)
Platelets: 346 10*3/uL (ref 140–400)
RBC: 4.96 10*6/uL (ref 4.20–5.80)
RDW: 13.5 % (ref 11.0–15.0)
WBC: 4.9 10*3/uL (ref 3.8–10.8)

## 2019-07-31 LAB — TESTOSTERONE: Testosterone: 459 ng/dL (ref 250–827)

## 2019-07-31 LAB — PSA: PSA: 0.7 ng/mL (ref <=4.0)

## 2019-08-14 ENCOUNTER — Other Ambulatory Visit: Payer: Self-pay | Admitting: Family Medicine

## 2019-08-15 ENCOUNTER — Other Ambulatory Visit: Payer: Self-pay | Admitting: Family Medicine

## 2019-08-16 ENCOUNTER — Encounter: Payer: Self-pay | Admitting: Family Medicine

## 2019-08-17 MED ORDER — VITAMIN D (ERGOCALCIFEROL) 1.25 MG (50000 UNIT) PO CAPS: 50000.0000 [IU] | ORAL_CAPSULE | ORAL | 3 refills | Status: DC

## 2019-08-24 ENCOUNTER — Other Ambulatory Visit: Payer: Self-pay | Admitting: Family Medicine

## 2019-08-25 ENCOUNTER — Other Ambulatory Visit: Payer: Self-pay | Admitting: Family Medicine

## 2019-08-25 DIAGNOSIS — E349 Endocrine disorder, unspecified: Secondary | ICD-10-CM

## 2019-08-25 MED ORDER — TESTOSTERONE CYPIONATE 200 MG/ML IM SOLN: 100.0000 mg | INTRAMUSCULAR | 0 refills | Status: DC

## 2019-08-26 ENCOUNTER — Encounter: Payer: Self-pay | Admitting: Family Medicine

## 2019-09-17 ENCOUNTER — Ambulatory Visit (INDEPENDENT_AMBULATORY_CARE_PROVIDER_SITE_OTHER): Payer: Managed Care, Other (non HMO)

## 2019-09-17 ENCOUNTER — Ambulatory Visit (INDEPENDENT_AMBULATORY_CARE_PROVIDER_SITE_OTHER): Payer: Managed Care, Other (non HMO) | Admitting: Family Medicine

## 2019-09-17 ENCOUNTER — Ambulatory Visit: Payer: Managed Care, Other (non HMO) | Admitting: Family Medicine

## 2019-09-17 ENCOUNTER — Encounter: Payer: Self-pay | Admitting: Family Medicine

## 2019-09-17 ENCOUNTER — Other Ambulatory Visit: Payer: Self-pay

## 2019-09-17 VITALS — BP 110/76 | HR 105 | Temp 98.0°F | Wt 314.0 lb

## 2019-09-17 DIAGNOSIS — M546 Pain in thoracic spine: Secondary | ICD-10-CM

## 2019-09-17 DIAGNOSIS — M545 Low back pain, unspecified: Secondary | ICD-10-CM

## 2019-09-17 LAB — POCT URINALYSIS DIPSTICK
Bilirubin, UA: NEGATIVE
Blood, UA: NEGATIVE
Glucose, UA: NEGATIVE
Ketones, UA: NEGATIVE
Leukocytes, UA: NEGATIVE
Nitrite, UA: NEGATIVE
Protein, UA: NEGATIVE
Spec Grav, UA: >=1.03 — AB (ref 1.010–1.025)
Urobilinogen, UA: 0.2 E.U./dL
pH, UA: 5.5 (ref 5.0–8.0)

## 2019-09-17 MED ORDER — TRAMADOL HCL 50 MG PO TABS: 50.0000 mg | ORAL_TABLET | Freq: Three times a day (TID) | ORAL | 0 refills | Status: DC | PRN

## 2019-09-17 NOTE — Patient Instructions (Signed)
Thank you for coming in today.  I think your pain is muscular related.  Attend PT.  OK to continue iburprofen and muscle relaxer.    Use heating pad and TENS unit.  Use tramadol sparingly at evening for pain  DO not drive with this medicine.   TENS UNIT: This is helpful for muscle pain and spasm.   Search and Purchase a TENS 7000 2nd edition at  www.tenspros.com or www.[employer].com It should be less than $30.     TENS unit instructions: Do not shower or bathe with the unit on Turn the unit off before removing electrodes or batteries If the electrodes lose stickiness add a drop of water to the electrodes after they are disconnected from the unit and place on plastic sheet. If you continued to have difficulty, call the TENS unit company to purchase more electrodes. Do not apply lotion on the skin area prior to use. Make sure the skin is clean and dry as this will help prolong the life of the electrodes. After use, always check skin for unusual red areas, rash or other skin difficulties. If there are any skin problems, does not apply electrodes to the same area. Never remove the electrodes from the unit by pulling the wires. Do not use the TENS unit or electrodes other than as directed. Do not change electrode placement without consultating your therapist or physician. Keep 2 fingers with between each electrode. Wear time ratio is 2:1, on to off times.    For example on for 30 minutes off for 15 minutes and then on for 30 minutes off for 15 minutes

## 2019-09-17 NOTE — Progress Notes (Signed)
Dylan Munoz. is a 43 y.o. male who presents to [facility] today for left lower and right upper back pain.  Patient has a 3-week history of pain in his left lower back.  He denies any urinary frequency urgency dysuria or blood in the urine.  Pain is worse in the morning with activity.  Also has pain when he is seated driving his car.  Pain does not radiate.  No weakness or numbness.  No fevers or chills.  No injury.  He is tried ibuprofen and muscle relaxers which have not helped.  He rates his pain is moderate.  Additionally he notes pain and pressure in his right thoracic back.  He notes pain is also worse with activity.  He denies any injury or arm weakness.  Again ibuprofen and muscle relaxers have not been very helpful.  He was recently treated for H. pylori with quadruple therapy about 6 weeks ago.  He feels better from an abdominal standpoint.  Additionally he had CT scan when hospitalized in July which showed diverticulosis as well as cecum changes concerning for hemorrhage of diverticula.  He has been seen by his gastroenterologist and has a colonoscopy scheduled to follow this up later this month.    ROS as above:  Exam:  BP 110/76   Pulse (!) 105   Temp 98 F (36.7 C) (Oral)   Wt (!) 314 lb (142.4 kg)   BMI 42.59 kg/m  Wt Readings from Last 5 Encounters:  09/17/19 (!) 314 lb (142.4 kg)  07/30/19 295 lb 8 oz (134 kg)  04/22/19 293 lb (132.9 kg)  01/22/19 288 lb 8 oz (130.9 kg)  01/04/19 282 lb (127.9 kg)    Gen: Well NAD HEENT: EOMI,  MMM Lungs: Normal work of breathing. CTABL Heart: RRR no MRG Abd: NABS, Soft. Nondistended, Nontender no CVA angle tenderness to percussion. Exts: Brisk capillary refill, warm and well perfused.  MSK: Nontender thoracic and lumbar spine to midline. Mildly tender palpation right thoracic paraspinal musculature.  Normal shoulder motion. Chest expansion is symmetrical bilaterally. L-spine: Mildly tender left lumbar paraspinal musculature.  Normal lumbar motion.  Lower extremity strength is intact.  Normal gait.  Lab and Radiology Results Results for orders placed or performed in visit on 09/17/19 (from the past 72 hour(s))  POCT Urinalysis Dipstick     Status: Abnormal   Collection Time: 09/17/19 11:02 AM  Result Value Ref Range   Color, UA yellow    Clarity, UA clear    Glucose, UA Negative Negative   Bilirubin, UA negative    Ketones, UA negative    Spec Grav, UA >=1.030 (A) 1.010 - 1.025   Blood, UA negative    pH, UA 5.5 5.0 - 8.0   Protein, UA Negative Negative   Urobilinogen, UA 0.2 0.2 or 1.0 E.U./dL   Nitrite, UA negaitve    Leukocytes, UA Negative Negative   Appearance     Odor     No results found.  2 view chest x-ray images obtained today personally and independently reviewed. No infiltrate or acute changes.  Normal-appearing chest x-ray. Await formal radiology review  Assessment and Plan: 43 y.o. male with  Left low back pain and right upper back pain.  At this point likely musculoskeletal.  Plan for referral to physical therapy.  Also proceed with further GI work-up including colonoscopy.  Recheck if not improving.    PDMP reviewed during this  encounter. Orders Placed This Encounter  Procedures  . DG Chest 2 View    Order Specific Question:   Reason for exam:    Answer:   Right upper back pain. RU lung    Order Specific Question:   Preferred imaging location?    Answer:   [facility]  . Ambulatory referral to Physical Therapy    Referral Priority:   Routine    Referral Type:   Physical Medicine    Referral Reason:   Specialty Services Required    Requested Specialty:   Physical Therapy  . POCT Urinalysis Dipstick   Meds ordered this encounter  Medications  . traMADol (ULTRAM) 50 MG tablet    Sig: Take 1 tablet (50 mg total) by mouth every 8  (eight) hours as needed for severe pain.    Dispense:  15 tablet    Refill:  0     Historical information moved to improve visibility of documentation.  Past Medical History:  Diagnosis Date  . High cholesterol   . Hypertension   . Morbid obesity (HCC) 04/17/2016  . Severe obstructive sleep apnea 04/17/2016   AHI 14.9 with desats <88% for >90 mins on SNAP report 04/02/18. See scanned document.  . Testosterone deficiency 04/19/2016  . Vitamin D deficiency 04/19/2016   Past Surgical History:  Procedure Laterality Date  . KNEE ARTHROSCOPY W/ ACL RECONSTRUCTION     Social History   Tobacco Use  . Smoking status: Never Smoker  . Smokeless tobacco: Current User    Types: Chew  Substance Use Topics  . Alcohol use: Yes    Alcohol/week: 0.0 standard drinks    Comment: weekly   family history includes Diabetes in his paternal uncle; Heart disease in his father.  Medications: Current Outpatient Medications  Medication Sig Dispense Refill  . Alcohol Swabs (ALCOHOL PREP) PADS Use to clean skin and vials for testosterone injections. 100 each prn  . AMBULATORY NON FORMULARY MEDICATION Please set CPAP to auto-titration from 10-20.  \  Adjust mask fit.  Send 30 day compliance report 30 days after change is made.   Send to [facility] 1 each 0  . atorvastatin (LIPITOR) 80 MG tablet TAKE 1 TABLET (80 MG TOTAL) BY MOUTH DAILY. DUE FOR FOLLOW UP VISIT W/PCP 30 tablet 0  . BD HYPODERMIC NEEDLE 18G X 1" MISC USE TO DRAW UP TESTOSTERONE WEEKLY 25 each prn  . Cyanocobalamin (B-12) 5000 MCG SUBL Place 1 tablet under the tongue daily. 90 tablet 1  . lisinopril-hydrochlorothiazide (PRINZIDE,ZESTORETIC) 20-25 MG tablet TAKE 1 TABLET BY MOUTH EVERY DAY 90 tablet 1  . NEEDLE, DISP, 18 G (B-D BLUNT FILL NEEDLE) 18G X 1-1/2" MISC Use to draw up testosterone weekly 50 each 0  . SYRINGE-NEEDLE, DISP, 3 ML (B-D 3CC LUER-LOK SYR 22GX1-1/2) 22G X 1-1/2" 3 ML MISC USE TO ADMINISTER TESTOSTERONE WEEKLY 50 each 1  .  testosterone cypionate (DEPOTESTOSTERONE CYPIONATE) 200 MG/ML injection Inject 0.5 mLs (100 mg total) into the muscle once a week. 10 mL 0  . traMADol (ULTRAM) 50 MG tablet Take 1 tablet (50 mg total) by mouth every 8 (eight) hours as needed for severe pain. 15 tablet 0  . triamcinolone cream (KENALOG) 0.1 % Apply 1 application topically 2 (two) times daily. Apply to rash 453.6 g 12  . Vitamin D, Ergocalciferol, (DRISDOL) 1.25 MG (50000 UT) CAPS capsule Take 1 capsule (50,000 Units total) by mouth every 7 (seven) days. 12 capsule 3   No current facility-administered medications  for this visit.    Allergies  Allergen Reactions  . Amlodipine Other (See Comments)    Constipation     Discussed warning signs or symptoms. Please see discharge instructions. Patient expresses understanding.

## 2019-09-19 ENCOUNTER — Other Ambulatory Visit: Payer: Self-pay | Admitting: Family Medicine

## 2019-09-23 ENCOUNTER — Other Ambulatory Visit: Payer: Self-pay | Admitting: Family Medicine

## 2019-09-23 DIAGNOSIS — E349 Endocrine disorder, unspecified: Secondary | ICD-10-CM

## 2019-09-24 ENCOUNTER — Other Ambulatory Visit: Payer: Self-pay

## 2019-09-24 ENCOUNTER — Encounter: Payer: Self-pay | Admitting: Physical Therapy

## 2019-09-24 ENCOUNTER — Ambulatory Visit (INDEPENDENT_AMBULATORY_CARE_PROVIDER_SITE_OTHER): Payer: Managed Care, Other (non HMO) | Admitting: Physical Therapy

## 2019-09-24 DIAGNOSIS — M545 Low back pain, unspecified: Secondary | ICD-10-CM

## 2019-09-24 DIAGNOSIS — M546 Pain in thoracic spine: Secondary | ICD-10-CM | POA: Diagnosis not present

## 2019-09-24 NOTE — Therapy (Signed)
[facility] [place] [address] [place], [place], [address] Phone: [phone]   Fax:  [phone]  Physical Therapy Evaluation  Patient Details  Name: Dylan Munoz. MRN: [id number] Date of Birth: 1976/08/27 No data recorded  Encounter Date: 09/24/2019  PT End of Session - 09/24/19 0847    Visit Number  1    Number of Visits  8    Date for PT Re-Evaluation  11/19/19    PT Start Time  0847    PT Stop Time  0932    PT Time Calculation (min)  45 min    Activity Tolerance  Patient tolerated treatment well    Behavior During Therapy  [facility] for tasks assessed/performed       Past Medical History:  Diagnosis Date  . High cholesterol   . Hypertension   . Morbid obesity (HCC) 04/17/2016  . Severe obstructive sleep apnea 04/17/2016   AHI 14.9 with desats <88% for >90 mins on SNAP report 04/02/18. See scanned document.  . Testosterone deficiency 04/19/2016  . Vitamin D deficiency 04/19/2016    Past Surgical History:  Procedure Laterality Date  . KNEE ARTHROSCOPY W/ ACL RECONSTRUCTION      There were no vitals filed for this visit.   Subjective Assessment - 09/24/19 0857    Subjective  Patient began experiencing mid and low back pain starting one month ago. 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 thoracic pain is on the left side. He drives locally. also feels in his own car. He also feels sitting at home.    Pertinent History  ACL repair, HTN    Patient Stated Goals  get rid of pain    Currently in Pain?  Yes    Pain Score  1     Pain Location  Back    Pain Orientation  Right;Left;Lower;Mid    Pain Descriptors / Indicators  Aching    Pain Type  Acute pain    Pain Onset  1 to 4 weeks ago    Pain Frequency  Intermittent    Aggravating Factors   sitting    Pain Relieving Factors  stretching ibuprofen         [facility] PT Assessment - 09/24/19 0001      Balance Screen   Has the patient fallen in the past 6 months   No    Has the patient had a decrease in activity level because of a fear of falling?   No    Is the patient reluctant to leave their home because of a fear of falling?   No      Prior Function   Level of Independence  Independent    Vocation  Full time employment    Vocation Requirements  driving truck recycling      ROM / Strength   AROM / PROM / Strength  AROM;Strength      AROM   AROM Assessment Site  Lumbar    Lumbar Flexion  WNL    Lumbar Extension  WNL    Lumbar - Right Side Bend  24    Lumbar - Left Side Bend  17    Lumbar - Left Rotation  tightness      Strength   Overall Strength Comments  BLE 5/5 poor stability with resisted right hip flex      Flexibility   Soft Tissue Assessment /Muscle Length  yes    Hamstrings  bil Rt>Lt  Rehab Potential  Good    PT Frequency  1x / week    PT Duration  --   every 2 weeks due to high copay   PT Treatment/Interventions  ADLs/Self Care Home Management;Electrical Stimulation;Moist Heat;Therapeutic exercise;Neuromuscular re-education;Manual techniques;Patient/family education;Dry needling    PT Next Visit Plan  assess DN and continue if indicated. STW/manual, LE flexibility and core  strength, Progress HEP due to high copay    PT Home Exercise Plan  see pt instructions       Patient will benefit from skilled therapeutic intervention in order to improve the following deficits and impairments:  Increased muscle spasms, Decreased range of motion, Impaired flexibility, Pain  Visit Diagnosis: Pain in thoracic spine - Plan: PT plan of care cert/re-cert  Acute bilateral low back pain, unspecified whether sciatica present - Plan: PT plan of care cert/re-cert     Problem List Patient Active Problem List   Diagnosis Date Noted  . B12 deficiency 11/09/2018  . Constipation 06/26/2018  . Cramping of hands 05/28/2016  . Hyperlipidemia 04/19/2016  . Testosterone deficiency 04/19/2016  . Vitamin D deficiency 04/19/2016  . HTN (hypertension) 04/17/2016  . Morbid obesity (HCC) 04/17/2016  . Severe obstructive sleep apnea 04/17/2016  . Left knee pain 04/17/2016  . Paresthesia and pain of both upper extremities 04/17/2016    Mister Krahenbuhl PT 09/24/2019, 12:27 PM  [facility] [address] [place], [place], [address] Phone: [phone]   Fax:  [phone]  Name: Wallace CullensWendell Malicoat Jr. MRN: [id number] Date of Birth: 07/30/1976  Rehab Potential  Good    PT Frequency  1x / week    PT Duration  --   every 2 weeks due to high copay   PT Treatment/Interventions  ADLs/Self Care Home Management;Electrical Stimulation;Moist Heat;Therapeutic exercise;Neuromuscular re-education;Manual techniques;Patient/family education;Dry needling    PT Next Visit Plan  assess DN and continue if indicated. STW/manual, LE flexibility and core  strength, Progress HEP due to high copay    PT Home Exercise Plan  see pt instructions       Patient will benefit from skilled therapeutic intervention in order to improve the following deficits and impairments:  Increased muscle spasms, Decreased range of motion, Impaired flexibility, Pain  Visit Diagnosis: Pain in thoracic spine - Plan: PT plan of care cert/re-cert  Acute bilateral low back pain, unspecified whether sciatica present - Plan: PT plan of care cert/re-cert     Problem List Patient Active Problem List   Diagnosis Date Noted  . B12 deficiency 11/09/2018  . Constipation 06/26/2018  . Cramping of hands 05/28/2016  . Hyperlipidemia 04/19/2016  . Testosterone deficiency 04/19/2016  . Vitamin D deficiency 04/19/2016  . HTN (hypertension) 04/17/2016  . Morbid obesity (HCC) 04/17/2016  . Severe obstructive sleep apnea 04/17/2016  . Left knee pain 04/17/2016  . Paresthesia and pain of both upper extremities 04/17/2016    Mister Krahenbuhl PT 09/24/2019, 12:27 PM  [facility] [address] [place], [place], [address] Phone: [phone]   Fax:  [phone]  Name: Wallace CullensWendell Malicoat Jr. MRN: [id number] Date of Birth: 07/30/1976

## 2019-09-24 NOTE — Patient Instructions (Signed)
HIP: Flexors - Supine for right side   Lie on edge of surface. Place leg off the surface, allow knee to bend. Bring other knee toward chest. Hold _60__ seconds. _3__ reps per set, _2-3__ times per day, Rest lowered foot on stool if needed.    Hip Flexor Stretch   Kneel as shown. Interlace fingers on top of right knee. Keeping trunk straight and contracting abdominal muscles, slowly shift weight forward. Continue breathing normally and hold position for 30 -60 seconds. Repeat on other leg. Alternate sides _3__ times. Do _2-3__ times per day.    Iliotibial Band Stretch, Side-Lying   Lie on side, back to edge of bed, top arm in front. Allow top leg to drape behind over edge. Hold 30 or more seconds.  Repeat 3 times per session. Do _2-3 sessions per day.   ALSO, LIE ON YOUR BACK WITH HIPS TO THE LEFT, SHOULDERS AND FEET TO THE RIGHT. HOLD 1-5 MIN.    Trigger Point Dry Needling  . What is Trigger Point Dry Needling (DN)? o DN is a physical therapy technique used to treat muscle pain and dysfunction. Specifically, DN helps deactivate muscle trigger points (muscle knots).  o A thin filiform needle is used to penetrate the skin and stimulate the underlying trigger point. The goal is for a local twitch response (LTR) to occur and for the trigger point to relax. No medication of any kind is injected during the procedure.   . What Does Trigger Point Dry Needling Feel Like?  o The procedure feels different for each individual patient. Some patients report that they do not actually feel the needle enter the skin and overall the process is not painful. Very mild bleeding may occur. However, many patients feel a deep cramping in the muscle in which the needle was inserted. This is the local twitch response.   Marland Kitchen How Will I feel after the treatment? o Soreness is normal, and the onset of soreness may not occur for a few hours. Typically this soreness does not last longer than two days.  o Bruising  is uncommon, however; ice can be used to decrease any possible bruising.  o In rare cases feeling tired or nauseous after the treatment is normal. In addition, your symptoms may get worse before they get better, this period will typically not last longer than 24 hours.   . What Can I do After My Treatment? o Increase your hydration by drinking more water for the next 24 hours. o You may place ice or heat on the areas treated that have become sore, however, do not use heat on inflamed or bruised areas. Heat often brings more relief post needling. o You can continue your regular activities, but vigorous activity is not recommended initially after the treatment for 24 hours. o DN is best combined with other physical therapy such as strengthening, stretching, and other therapies.     Madelyn Flavors, PT 09/24/19 9:31 AM;  [facility] [place], [place] [address]  [phone] (office) [phone] (fax)

## 2019-10-01 ENCOUNTER — Other Ambulatory Visit: Payer: Self-pay

## 2019-10-01 ENCOUNTER — Ambulatory Visit (INDEPENDENT_AMBULATORY_CARE_PROVIDER_SITE_OTHER): Payer: Managed Care, Other (non HMO) | Admitting: Physical Therapy

## 2019-10-01 ENCOUNTER — Encounter: Payer: Self-pay | Admitting: Physical Therapy

## 2019-10-01 DIAGNOSIS — M545 Low back pain, unspecified: Secondary | ICD-10-CM

## 2019-10-01 NOTE — Therapy (Addendum)
[place], [place], [address] Phone: [phone]   Fax:  [phone]  Physical Therapy Treatment  Patient Details  Name: Dylan Munoz. MRN: [id number] Date of Birth: March 22, 1976 No data recorded  Encounter Date: 10/01/2019  PT End of Session - 10/01/19 0847    Visit Number  2    Number of Visits  8    Date for PT Re-Evaluation  11/19/19    PT Start Time  0847    PT Stop Time  0933    PT Time Calculation (min)  46 min    Activity Tolerance  Patient tolerated treatment well    Behavior During Therapy  [facility] for tasks assessed/performed       Past Medical History:  Diagnosis Date  . High cholesterol   . Hypertension   . Morbid obesity ([place]) 04/17/2016  . Severe obstructive sleep apnea 04/17/2016   AHI 14.9 with desats <88% for >90 mins on SNAP report 04/02/18. See scanned document.  . Testosterone deficiency 04/19/2016  . Vitamin D deficiency 04/19/2016    Past Surgical History:  Procedure Laterality Date  . KNEE ARTHROSCOPY W/ ACL RECONSTRUCTION      There were no vitals filed for this visit.  Subjective Assessment - 10/01/19 0849    Subjective  Patient reports relief in thoracic spine pain. He continues to have low back pain at 2/10 which comes and goes.    Pertinent History  ACL repair, HTN    Patient Stated Goals  get rid of pain    Currently in Pain?  Yes    Pain Score  2     Pain Location  Back    Pain Orientation  Left;Lower    Pain Descriptors / Indicators  Aching    Pain Type  Acute pain                       [facility] Adult PT Treatment/Exercise - 10/01/19 0001      Exercises   Exercises  Lumbar      Lumbar Exercises: Stretches   Active Hamstring Stretch  Right;Left;2 reps;30 seconds    Pelvic Tilt  5 reps;5 seconds    Press Ups  20 reps    Quad Stretch  Right;Left;2 reps;30 seconds    Quad Stretch Limitations  with strap    Other Lumbar Stretch Exercise   Reverse C stretch for left QL 2 x 1 min    Other Lumbar Stretch Exercise  reviewed standing side bend and standing extensions; also chin tucks x 5      Lumbar Exercises: Aerobic   Tread Mill  1.6-2.0 mph x 5 min      Lumbar Exercises: Standing   Other Standing Lumbar Exercises  attempted chin tucks but too difficulty today      Lumbar Exercises: Supine   Bent Knee Raise  10 reps    Bent Knee Raise Limitations  up/up/down/down lead to pain in left low back    Bridge  20 reps      Manual Therapy   Manual Therapy  Soft tissue mobilization    Soft tissue mobilization  to left QL and gluteals       Trigger Point Dry Needling - 10/01/19 0001    Consent Given?  Yes    Muscles Treated Back/Hip  Gluteus medius;Quadratus lumborum    Gluteus Medius Response  Twitch response elicited;Palpable increased muscle length    Quadratus Lumborum  Response  Twitch response elicited;Palpable increased muscle length           PT Education - 10/01/19 0941    Education Details  HEP; educated on spinal discs and importance of avoiding too much flexion after driving; also educated on chin tucks due to disc problem per pt.    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 He continues to have low backpain on the left side and was still very tight in left QL. Good response to DN here and in gluteals. He did well with TE but has difficulty with  transverse abdominus engagement. HEP was progressed. Pt to return in two weeks.    PT Frequency  1x / week    PT Duration  Other (comment)   every 2 weeks   PT Treatment/Interventions  ADLs/Self Care Home Management;Electrical Stimulation;Moist Heat;Therapeutic exercise;Neuromuscular re-education;Manual techniques;Patient/family education;Dry needling    PT Next Visit Plan  assess DN and continue if indicated. STW/manual, LE flexibility and core strength, Progress HEP due to high copay    Consulted and Agree with Plan of Care  Patient       Patient will benefit from skilled therapeutic intervention in order to improve the following deficits and impairments:  Increased muscle spasms, Decreased range of motion, Impaired flexibility, Pain  Visit Diagnosis: Acute bilateral low back pain, unspecified whether sciatica present     Problem List Patient Active Problem List   Diagnosis Date Noted  . B12 deficiency 11/09/2018  . Constipation 06/26/2018  . Cramping of hands 05/28/2016  . Hyperlipidemia 04/19/2016  . Testosterone deficiency 04/19/2016  . Vitamin D deficiency 04/19/2016  . HTN (hypertension) 04/17/2016  . Morbid obesity ([place]) 04/17/2016  . Severe obstructive sleep apnea 04/17/2016  . Left knee pain 04/17/2016  . Paresthesia and pain of both upper extremities 04/17/2016    Madelyn Flavors PT 10/01/2019, 9:47 AM  [facility] [place], [place], [address] Phone: [phone]   Fax:  [phone]  Name: Dylan Munoz. MRN: [id number] Date of Birth: [birth year]   PHYSICAL THERAPY DISCHARGE SUMMARY  Visits from Start of Care: 2  Current functional level related to goals / functional outcomes: See above   Remaining deficits: See above   Education / Equipment: HEP Plan: Patient agrees to discharge.  Patient goals were partially met. Patient is being discharged due to not returning since the last  visit.  ?????    Madelyn Flavors, PT 11/08/19 8:36 PM  [facility] at [place], [place] [address]  [phone] (office) [phone] (fax)

## 2019-10-01 NOTE — Patient Instructions (Signed)
Access Code: [email or web address]  URL: [email or web address]  Date: 10/01/2019  Prepared by: Madelyn Flavors   Exercises Supine Bridge - 10 reps - 1-2 sets - 2-3 sec hold - 2x daily - 7x weekly Prone Press Up - 10 reps - 3 sets - 2x daily - 7x weekly Prone Quadriceps Stretch with Strap - 3 reps - 1 sets - 60 sec hold - 2-3x daily - 7x weekly Supine Chin Tuck - 10 reps - 3 sets - 1x daily - 7x weekly Seated Cervical Retraction - 10 reps - 1 sets - 3-5 sec hold - 3x daily - 7x weekly

## 2019-10-08 LAB — HM COLONOSCOPY

## 2019-10-15 ENCOUNTER — Encounter: Payer: Managed Care, Other (non HMO) | Admitting: Physical Therapy

## 2019-10-17 ENCOUNTER — Other Ambulatory Visit: Payer: Self-pay | Admitting: Family Medicine

## 2019-10-18 ENCOUNTER — Other Ambulatory Visit: Payer: Self-pay | Admitting: Family Medicine

## 2019-10-18 DIAGNOSIS — E349 Endocrine disorder, unspecified: Secondary | ICD-10-CM

## 2019-10-18 NOTE — Telephone Encounter (Signed)
Looks like 82mL was rx just 1 month ago. Can we call pharmacy and confirm how much was picked up.

## 2019-10-19 ENCOUNTER — Encounter: Payer: Self-pay | Admitting: Osteopathic Medicine

## 2019-10-19 NOTE — Telephone Encounter (Signed)
Called patient and advised him he has a refill with [facility].

## 2019-10-19 NOTE — Telephone Encounter (Signed)
Ok if they want a new rx we could send. Thanks.

## 2019-10-19 NOTE — Telephone Encounter (Signed)
Dylan Munoz he was only given 65ml as 17ml was on back order and he has 72ml left to fill so I am going to decline this medication and advised the pharmacist of this as well. KG LPN

## 2019-10-22 ENCOUNTER — Encounter: Payer: Managed Care, Other (non HMO) | Admitting: Physical Therapy

## 2019-10-31 IMAGING — CT CT ABD-PELV W/ CM
2 of 6 series · 16 of 46 positions shown, 18 images · IV contrast (APPLIED)
Comparison: 10/22/2010

CLINICAL DATA: Rectal pain for several days

EXAM:
CT ABDOMEN AND PELVIS WITH CONTRAST
TECHNIQUE: Multidetector CT imaging of the abdomen and pelvis was performed
using the standard protocol following bolus administration of
intravenous contrast.
CONTRAST:  100mL [id number]

[Series 2: axial st · axial · 0.98mm/px · z∈[-621,-186]mm · 13 of 101 slices shown, 15 images]
[im 7/101  soft-tissue]
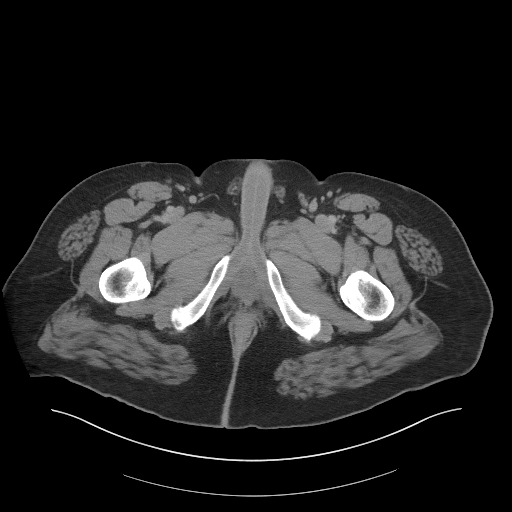
[im 7/101  bone]
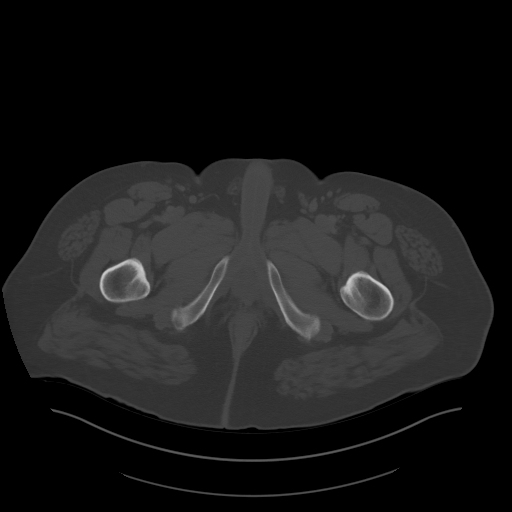
[im 13/101  soft-tissue]
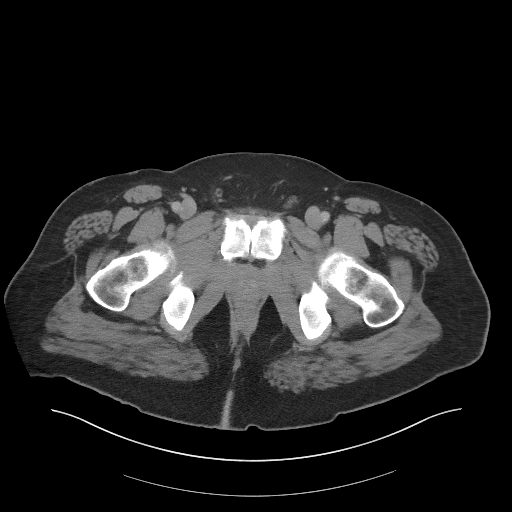
[im 19/101  soft-tissue]
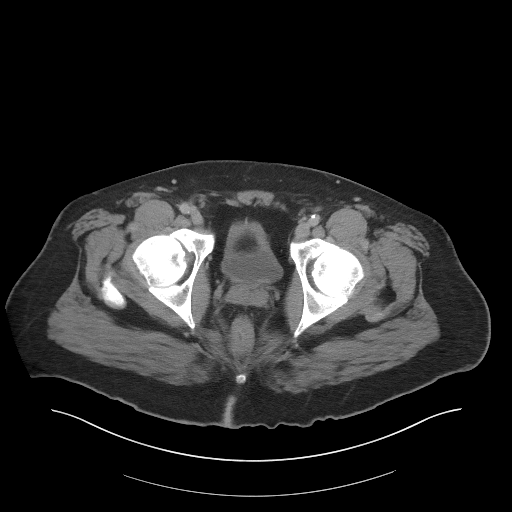
[im 32/101  soft-tissue]
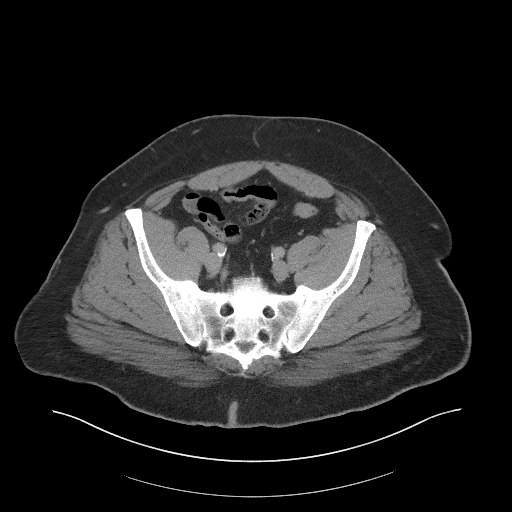
[im 38/101  soft-tissue]
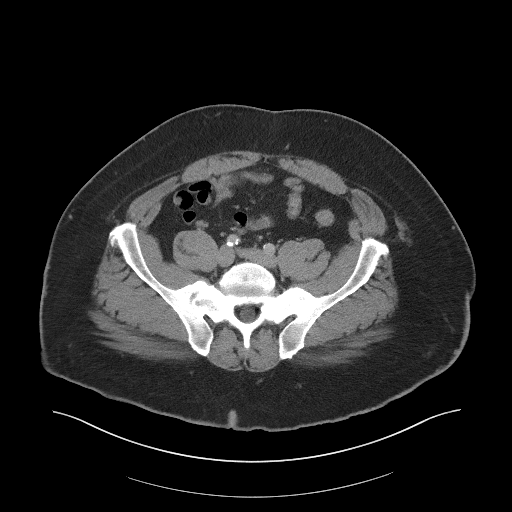
[im 44/101  soft-tissue]
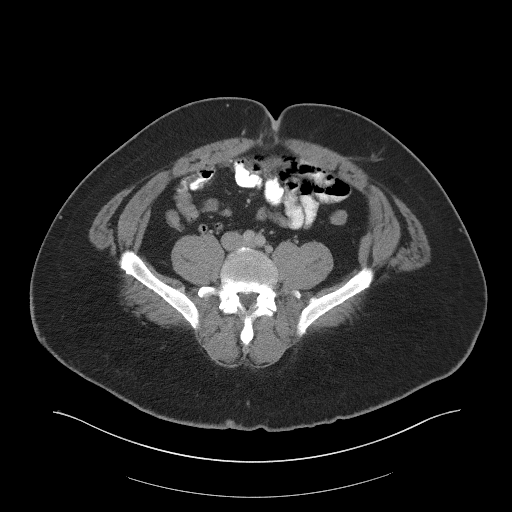
[im 51/101  soft-tissue]
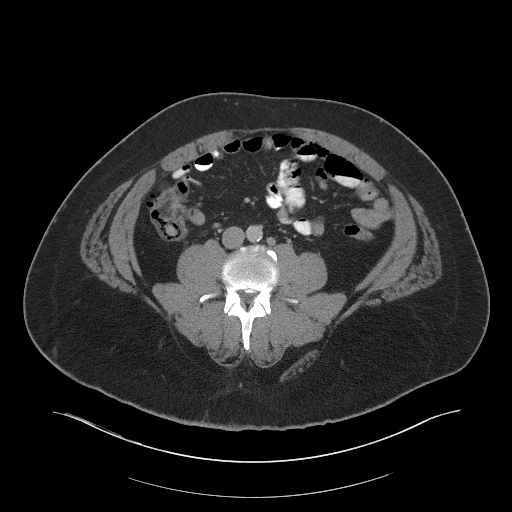
[im 57/101  soft-tissue]
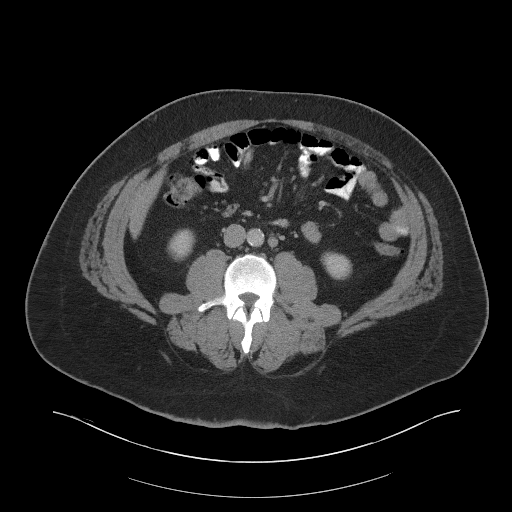
[im 63/101  soft-tissue]
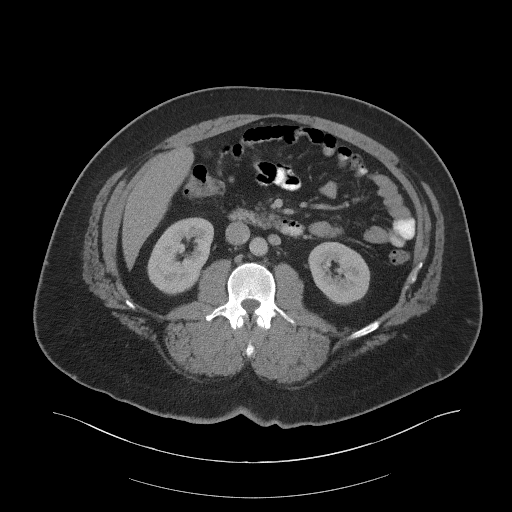
[im 63/101  bone]
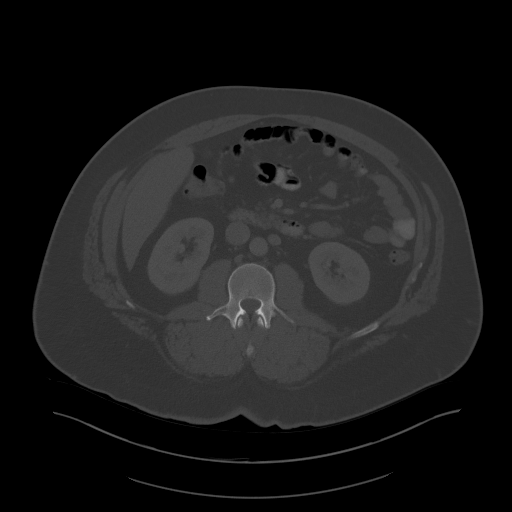
[im 69/101  soft-tissue]
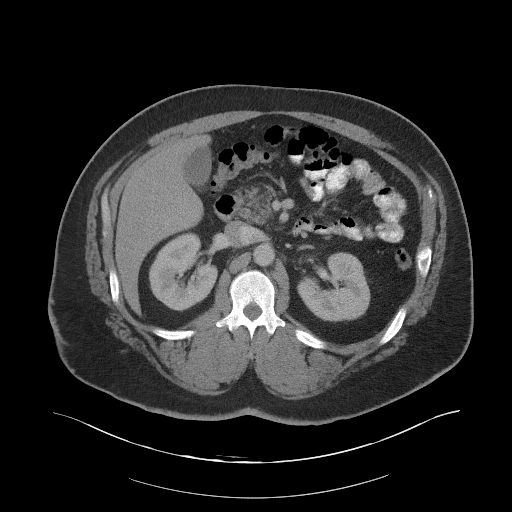
[im 82/101  soft-tissue]
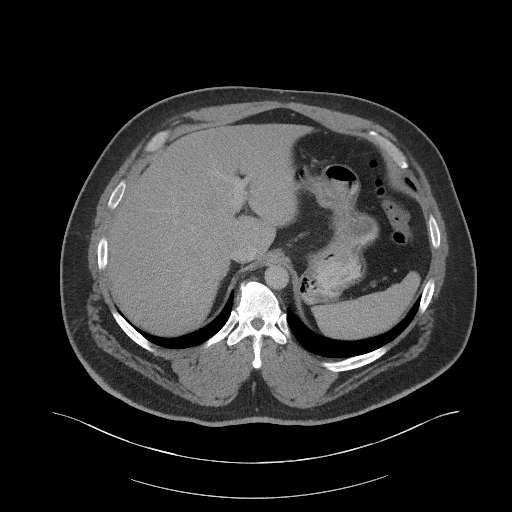
[im 88/101  soft-tissue]
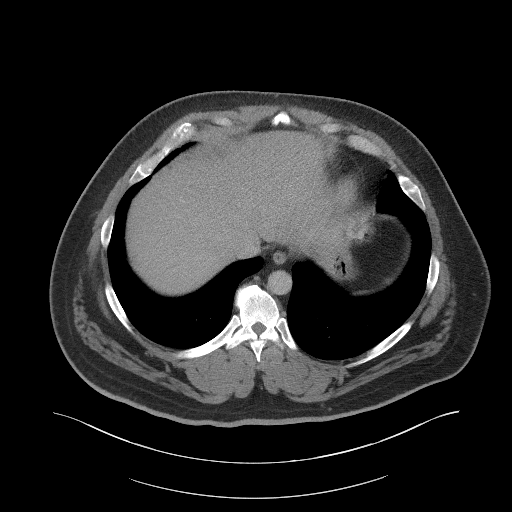
[im 94/101  soft-tissue]
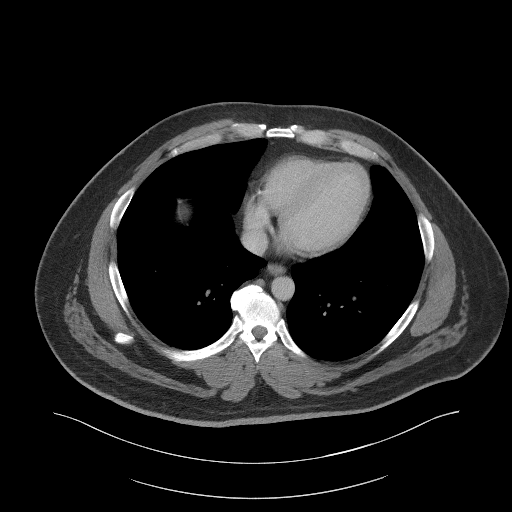

[Series 5: coronal st · coronal · 0.81mm/px · 3 of 103 slices shown]
[im 35/103  soft-tissue]
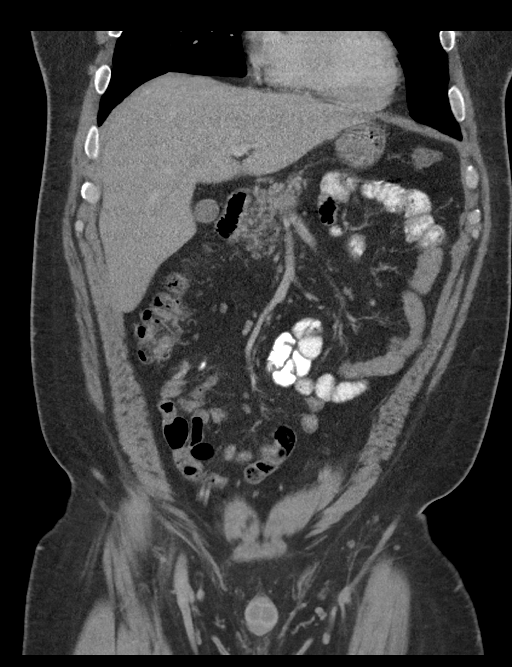
[im 46/103  soft-tissue]
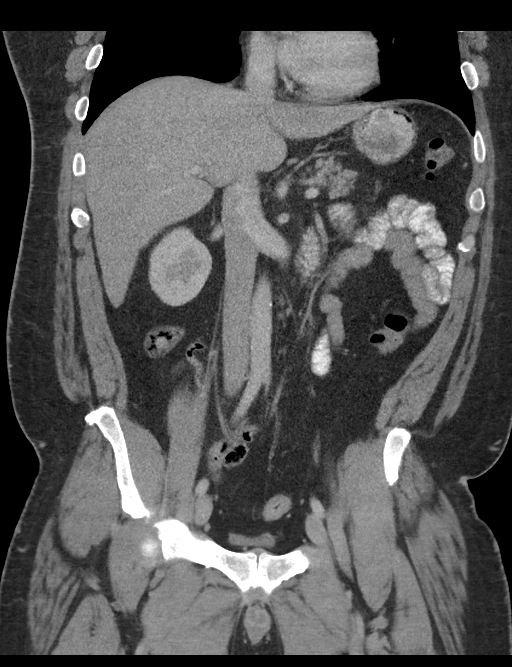
[im 57/103  soft-tissue]
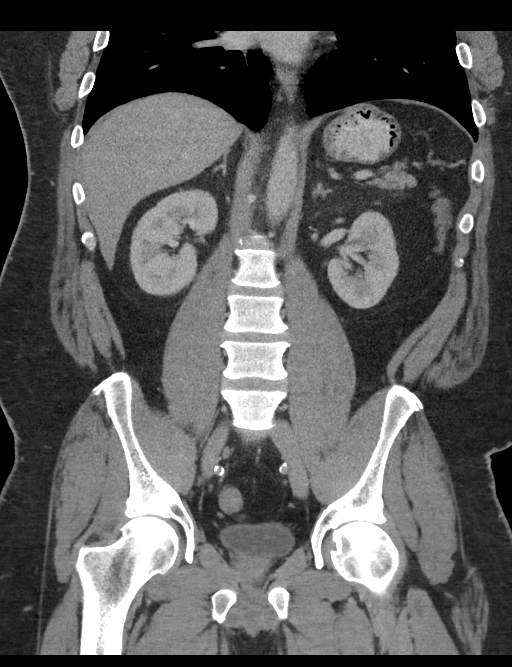

[16 of 46 positions shown; findings below may reference images not displayed]

FINDINGS: Lower chest: No acute abnormality.

Hepatobiliary: Liver shows mild fatty infiltration. The gallbladder
is within normal limits.

Pancreas: Unremarkable. No pancreatic ductal dilatation or
surrounding inflammatory changes.

Spleen: Normal in size without focal abnormality.

Adrenals/Urinary Tract: Adrenal glands are within normal limits. The
kidneys are well visualize without renal calculi or obstructive
changes. The ureters are within normal limits. The bladder is
decompressed.

Stomach/Bowel: Mild diverticular change of the colon is noted
without diverticulitis. The appendix is within normal limits. No
inflammatory changes are seen. The small bowel is unremarkable. The
stomach is decompressed as well.

Vascular/Lymphatic: Aortic atherosclerosis. No enlarged abdominal or
pelvic lymph nodes.

Reproductive: Prostate is unremarkable.

Other: No abdominal wall hernia or abnormality. No abdominopelvic
ascites.

Musculoskeletal: Degenerative changes of lumbar spine are seen. No
compression deformities are noted.
IMPRESSION: Chronic changes as described above.  No acute abnormality noted.

## 2019-11-06 ENCOUNTER — Other Ambulatory Visit: Payer: Self-pay | Admitting: Family Medicine

## 2019-11-13 ENCOUNTER — Other Ambulatory Visit: Payer: Self-pay | Admitting: Osteopathic Medicine

## 2019-11-13 NOTE — Telephone Encounter (Signed)
Requested medication (s) are due for refill today: yes  Requested medication (s) are on the active medication list: yes  Last refill: 10/18/2019  Future visit scheduled: yes  Notes to clinic:  Review for refills Patient needs labs   Requested Prescriptions  Pending Prescriptions Disp Refills   atorvastatin (LIPITOR) 80 MG tablet [Pharmacy Med Name: ATORVASTATIN 80 MG TABLET] 30 tablet 0    Sig: TAKE 1 TABLET (80 MG TOTAL) BY MOUTH DAILY. CONTACT DOCTOR FOR LABS     Cardiovascular:  Antilipid - Statins Failed - 11/13/2019  9:31 AM      Failed - Total Cholesterol in normal range and within 360 days    Cholesterol  Date Value Ref Range Status  08/07/2018 157 <200 mg/dL Final         Failed - LDL in normal range and within 360 days    LDL Cholesterol (Calc)  Date Value Ref Range Status  08/07/2018 108 (H) mg/dL (calc) Final    Comment:    Reference range: <100 . Desirable range <100 mg/dL for primary prevention;   <70 mg/dL for patients with CHD or diabetic patients  with > or = 2 CHD risk factors. Marland Kitchen LDL-C is now calculated using the Martin-Hopkins  calculation, which is a validated novel method providing  better accuracy than the Friedewald equation in the  estimation of LDL-C.  Cresenciano Genre et al. Annamaria Helling. 6270;350(09): 2061-2068  (http://education.[employer].com/faq/FAQ164)          Failed - HDL in normal range and within 360 days    HDL  Date Value Ref Range Status  08/07/2018 29 (L) >40 mg/dL Final         Failed - Triglycerides in normal range and within 360 days    Triglycerides  Date Value Ref Range Status  08/07/2018 105 <150 mg/dL Final         Passed - Patient is not pregnant      Passed - Valid encounter within last 12 months    Recent Outpatient Visits          1 month ago Acute bilateral low back pain, unspecified whether sciatica present   [place] [facility], Rebekah [place], MD   3 months ago H. pylori infection   [facility], Rebekah [place], MD   6 months ago Essential hypertension   [place] [facility], Rebekah [place], MD   1 year ago Cervical radiculitis   [place] [facility], Rebekah [place], MD   1 year ago Anemia, unspecified type   [facility], Rebekah [place], MD

## 2019-11-28 ENCOUNTER — Other Ambulatory Visit: Payer: Self-pay | Admitting: Physician Assistant

## 2019-12-02 ENCOUNTER — Other Ambulatory Visit: Payer: Self-pay | Admitting: Physician Assistant

## 2019-12-18 ENCOUNTER — Other Ambulatory Visit: Payer: Self-pay | Admitting: Family Medicine

## 2019-12-18 DIAGNOSIS — E349 Endocrine disorder, unspecified: Secondary | ICD-10-CM

## 2020-01-06 ENCOUNTER — Encounter: Payer: Self-pay | Admitting: Osteopathic Medicine

## 2020-01-07 MED ORDER — VITAMIN D (ERGOCALCIFEROL) 1.25 MG (50000 UNIT) PO CAPS: 50000.0000 [IU] | ORAL_CAPSULE | ORAL | 1 refills | Status: DC

## 2020-01-12 ENCOUNTER — Other Ambulatory Visit: Payer: Self-pay | Admitting: Family Medicine

## 2020-01-14 NOTE — Telephone Encounter (Signed)
[facility] cyclobenzaprine. Rx not listed in active med list. Pt has an upcoming appt with provider on 02/04/20. Pls advise, thanks.

## 2020-01-27 ENCOUNTER — Other Ambulatory Visit: Payer: Self-pay

## 2020-01-27 ENCOUNTER — Encounter (HOSPITAL_BASED_OUTPATIENT_CLINIC_OR_DEPARTMENT_OTHER): Payer: Self-pay | Admitting: Emergency Medicine

## 2020-01-27 ENCOUNTER — Emergency Department (HOSPITAL_BASED_OUTPATIENT_CLINIC_OR_DEPARTMENT_OTHER): Payer: Managed Care, Other (non HMO)

## 2020-01-27 ENCOUNTER — Emergency Department (HOSPITAL_BASED_OUTPATIENT_CLINIC_OR_DEPARTMENT_OTHER)
Admission: EM | Admit: 2020-01-27 | Discharge: 2020-01-27 | Disposition: A | Discharge status: Home / Self Care | Payer: Managed Care, Other (non HMO) | Attending: Emergency Medicine | Admitting: Emergency Medicine

## 2020-01-27 DIAGNOSIS — R079 Chest pain, unspecified: Secondary | ICD-10-CM | POA: Insufficient documentation

## 2020-01-27 DIAGNOSIS — F17220 Nicotine dependence, chewing tobacco, uncomplicated: Secondary | ICD-10-CM | POA: Insufficient documentation

## 2020-01-27 DIAGNOSIS — Z79899 Other long term (current) drug therapy: Secondary | ICD-10-CM | POA: Diagnosis not present

## 2020-01-27 DIAGNOSIS — Z20822 Contact with and (suspected) exposure to covid-19: Secondary | ICD-10-CM | POA: Diagnosis not present

## 2020-01-27 LAB — BASIC METABOLIC PANEL
Anion gap: 9 (ref 5–15)
BUN: 12 mg/dL (ref 6–20)
CO2: 24 mmol/L (ref 22–32)
Calcium: 8.8 mg/dL — ABNORMAL LOW (ref 8.9–10.3)
Chloride: 106 mmol/L (ref 98–111)
Creatinine, Ser: 1.11 mg/dL (ref 0.61–1.24)
GFR calc Af Amer: >60 mL/min (ref >60)
GFR calc non Af Amer: >60 mL/min (ref >60)
Glucose, Bld: 97 mg/dL (ref 70–99)
Potassium: 3.9 mmol/L (ref 3.5–5.1)
Sodium: 139 mmol/L (ref 135–145)

## 2020-01-27 LAB — CBC
HCT: 44.5 % (ref 39.0–52.0)
Hemoglobin: 14.9 g/dL (ref 13.0–17.0)
MCH: 31.2 pg (ref 26.0–34.0)
MCHC: 33.5 g/dL (ref 30.0–36.0)
MCV: 93.1 fL (ref 80.0–100.0)
Platelets: 235 10*3/uL (ref 150–400)
RBC: 4.78 MIL/uL (ref 4.22–5.81)
RDW: 14.7 % (ref 11.5–15.5)
WBC: 5.8 10*3/uL (ref 4.0–10.5)
nRBC: 0 % (ref 0.0–0.2)

## 2020-01-27 LAB — D-DIMER, QUANTITATIVE: D-Dimer, Quant: <0.27 ug/mL-FEU (ref 0.00–0.50)

## 2020-01-27 LAB — TROPONIN I (HIGH SENSITIVITY)
Troponin I (High Sensitivity): 13 ng/L (ref <18)
Troponin I (High Sensitivity): 13 ng/L (ref <18)

## 2020-01-27 LAB — SARS CORONAVIRUS 2 (TAT 6-24 HRS): SARS Coronavirus 2: NEGATIVE

## 2020-01-27 NOTE — ED Notes (Signed)
Pt verbalized understanding of dc instructions.

## 2020-01-27 NOTE — ED Provider Notes (Signed)
[facility] Emergency Department Provider Note MRN:  [id number]  Arrival date & time: 01/27/20     Chief Complaint   Chest Pain   History of Present Illness   Dylan Munoz. is a 44 y.o. year-old male with a history of hypertension, diabetes, OSA, obesity presenting to the ED with chief complaint of chest pain.  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   Review of Systems  A complete 10 system review of systems was obtained and all systems are negative except as noted in the HPI and PMH.   Patient's Health History    Past Medical History:  Diagnosis Date  . High cholesterol   . Hypertension   . Morbid obesity ([place]) 04/17/2016  . Severe obstructive sleep apnea 04/17/2016   AHI 14.9 with desats <88% for >90 mins on SNAP report 04/02/18. See scanned document.  . Testosterone deficiency 04/19/2016  . Vitamin D deficiency 04/19/2016    Past Surgical History:  Procedure Laterality Date  . KNEE ARTHROSCOPY W/ ACL RECONSTRUCTION      Family History  Problem Relation Age of Onset  . Heart disease Father   . Diabetes Paternal Uncle   . Colon cancer Neg Hx   . Esophageal cancer Neg Hx     Social History   Socioeconomic History  . Marital status: Single    Spouse name: Not on file  . Number of children: Not on file  . Years of education: Not on file  . Highest education level: Not on file  Occupational History  . Not on file  Tobacco Use  . Smoking status: Never Smoker  . Smokeless tobacco: Current User    Types: Chew  Substance and Sexual Activity  . Alcohol use: Yes    Alcohol/week: 0.0 standard drinks      Comment: weekly  . Drug use: No  . Sexual activity: Not on file  Other Topics Concern  . Not on file  Social History Narrative  . Not on file   Social Determinants of Health   Financial Resource Strain:   . Difficulty of Paying Living Expenses: Not on file  Food Insecurity:   . Worried About Charity fundraiser in the Last Year: Not on file  . Ran Out of Food in the Last Year: Not on file  Transportation Needs:   . Lack of Transportation (Medical): Not on file  . Lack of Transportation (Non-Medical): Not on file  Physical Activity:   . Days of Exercise per Week: Not on file  . Minutes of Exercise per Session: Not on file  Stress:   . Feeling of Stress : Not on file  Social Connections:   . Frequency of Communication with Friends and Family: Not on file  . Frequency of Social Gatherings with Friends and Family: Not on file  . Attends Religious Services: Not on file  . Active Member of Clubs or Organizations: Not on file  . Attends Archivist Meetings: Not on file  . Marital Status: Not on file  Intimate Partner Violence:   . Fear of Current or Ex-Partner: Not on file  . Emotionally Abused: Not on file  . Physically Abused: Not on file  .  Sexually Abused: Not on file     Physical Exam   Vitals:   01/27/20 0730 01/27/20 0800  BP: (!) 149/97 (!) 145/95  Pulse: (!) 105 94  Resp: 19 20  Temp:    SpO2: 96% 97%    CONSTITUTIONAL: Well-appearing, NAD NEURO:  Alert and oriented x 3, no focal deficits EYES:  eyes equal and reactive ENT/NECK:  no LAD, no JVD CARDIO: Tachycardic rate, well-perfused, normal S1 and S2 PULM:  CTAB no wheezing or rhonchi GI/GU:  normal bowel sounds, non-distended, non-tender MSK/SPINE:  No gross deformities, no edema SKIN:  no rash, atraumatic PSYCH:  Appropriate speech and behavior  *Additional and/or pertinent findings included in MDM below  Diagnostic and Interventional Summary    EKG  Interpretation  Date/Time:  Thursday January 27 2020 07:00:56 EST Ventricular Rate:  103 PR Interval:    QRS Duration: 87 QT Interval:  342 QTC Calculation: 448 R Axis:   65 Text Interpretation: Sinus tachycardia Borderline repolarization abnormality Baseline wander in lead(s) V1 Confirmed by Kennis Carina [phone]) on 01/27/2020 7:09:40 AM      Cardiac Monitoring Interpretation:  Labs Reviewed  BASIC METABOLIC PANEL - Abnormal; Notable for the following components:      Result Value   Calcium 8.8 (*)    All other components within normal limits  SARS CORONAVIRUS 2 (TAT 6-24 HRS)  CBC  D-DIMER, QUANTITATIVE (NOT AT [facility])  TROPONIN I (HIGH SENSITIVITY)  TROPONIN I (HIGH SENSITIVITY)    DG Chest Port 1 View  Final Result      Medications - No data to display   Procedures  /  Critical Care Procedures  ED Course and Medical Decision Making  I have reviewed the triage vital signs, the nursing notes, and pertinent available records from the EMR.  Pertinent labs & imaging results that were available during my care of the patient were reviewed by me and considered in my medical decision making (see below for details).     Chest tightness, tachycardic on arrival between 105 and 115 on my exam.  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  No leg pain or swelling.  Still would consider him low risk for PE, and his chest pain is atypical for ACS.  Screening with EKG, troponin, D-dimer.  Troponin is negative x2, D-dimer is negative, patient's heart rate has resolved on its own, he is feeling well, he is appropriate for discharge.  Patient did request a Covid test, advised home quarantine until negative result.  Elmer Sow. Pilar Plate, MD [facility] [email or web address]  Final Clinical Impressions(s) / ED Diagnoses     ICD-10-CM   1. Chest pain, unspecified type  R07.9     ED Discharge Orders    None        Discharge Instructions Discussed with and Provided to Patient:     Discharge Instructions     You were evaluated in the Emergency Department and after careful evaluation, we did not find any emergent condition requiring admission or further testing in the hospital.  Your exam/testing today was overall reassuring.  No signs of heart damage today or any other emergencies.  We have tested you for the coronavirus and your results should come back by tomorrow.  It is important that you quarantine at home until you receive a negative test result.  Please return to the Emergency Department if you experience any worsening of your condition.  We encourage  you to follow up with a primary care provider.  Thank you for allowing [name] to be a part of your care.        Sabas Sous, MD 01/27/20 [phone]

## 2020-01-27 NOTE — Discharge Instructions (Signed)
You were evaluated in the Emergency Department and after careful evaluation, we did not find any emergent condition requiring admission or further testing in the hospital.  Your exam/testing today was overall reassuring.  No signs of heart damage today or any other emergencies.  We have tested you for the coronavirus and your results should come back by tomorrow.  It is important that you quarantine at home until you receive a negative test result.  Please return to the Emergency Department if you experience any worsening of your condition.  We encourage you to follow up with a primary care provider.  Thank you for allowing [name] to be a part of your care.

## 2020-01-27 NOTE — ED Triage Notes (Signed)
Pt states he is having tightness in his left chest that started yesterday morning around 10am  Denies any other symptoms

## 2020-02-03 ENCOUNTER — Encounter: Payer: Self-pay | Admitting: Osteopathic Medicine

## 2020-02-03 NOTE — Telephone Encounter (Signed)
Patient has been rescheduled.

## 2020-02-04 ENCOUNTER — Ambulatory Visit: Payer: Managed Care, Other (non HMO) | Admitting: Osteopathic Medicine

## 2020-02-11 ENCOUNTER — Ambulatory Visit: Payer: Managed Care, Other (non HMO) | Admitting: Family Medicine

## 2020-02-18 ENCOUNTER — Encounter: Payer: Self-pay | Admitting: Family Medicine

## 2020-02-18 ENCOUNTER — Other Ambulatory Visit: Payer: Self-pay

## 2020-02-18 ENCOUNTER — Ambulatory Visit (INDEPENDENT_AMBULATORY_CARE_PROVIDER_SITE_OTHER): Payer: Managed Care, Other (non HMO) | Admitting: Family Medicine

## 2020-02-18 VITALS — BP 178/92 | HR 97 | Temp 98.6°F | Ht 71.0 in | Wt 328.0 lb

## 2020-02-18 DIAGNOSIS — E349 Endocrine disorder, unspecified: Secondary | ICD-10-CM | POA: Diagnosis not present

## 2020-02-18 DIAGNOSIS — I10 Essential (primary) hypertension: Secondary | ICD-10-CM | POA: Diagnosis not present

## 2020-02-18 DIAGNOSIS — E782 Mixed hyperlipidemia: Secondary | ICD-10-CM

## 2020-02-18 MED ORDER — LISINOPRIL-HYDROCHLOROTHIAZIDE 20-12.5 MG PO TABS: 2.0000 | ORAL_TABLET | Freq: Every day | ORAL | 1 refills | Status: DC

## 2020-02-18 MED ORDER — TESTOSTERONE CYPIONATE 200 MG/ML IM SOLN: 100.0000 mg | INTRAMUSCULAR | 2 refills | Status: DC

## 2020-02-18 NOTE — Patient Instructions (Addendum)
Great to meet you today! Be sure to take blood pressure medication every day.  I have changed the dose of this it will be two tablets daily now.  We'll be in touch with lab results.  I have entered a referral to nutrition clinic. You should be contacted for an appointment.  See me again in 3 months.    Managing Your Hypertension Hypertension is commonly called high blood pressure. This is when the force of your blood pressing against the walls of your arteries is too strong. Arteries are blood vessels that carry blood from your heart throughout your body. Hypertension forces the heart to work harder to pump blood, and may cause the arteries to become narrow or stiff. Having untreated or uncontrolled hypertension can cause heart attack, stroke, kidney disease, and other problems. What are blood pressure readings? A blood pressure reading consists of a higher number over a lower number. Ideally, your blood pressure should be below 120/80. The first ("top") number is called the systolic pressure. It is a measure of the pressure in your arteries as your heart beats. The second ("bottom") number is called the diastolic pressure. It is a measure of the pressure in your arteries as the heart relaxes. What does my blood pressure reading mean? Blood pressure is classified into four stages. Based on your blood pressure reading, your health care provider may use the following stages to determine what type of treatment you need, if any. Systolic pressure and diastolic pressure are measured in a unit called mm Hg. Normal  Systolic pressure: below 474.  Diastolic pressure: below 80. Elevated  Systolic pressure: 259-563.  Diastolic pressure: below 80. Hypertension stage 1  Systolic pressure: 875-643.  Diastolic pressure: 32-95. Hypertension stage 2  Systolic pressure: 188 or above.  Diastolic pressure: 90 or above. What health risks are associated with hypertension? Managing your hypertension is  an important responsibility. Uncontrolled hypertension can lead to:  A heart attack.  A stroke.  A weakened blood vessel (aneurysm).  Heart failure.  Kidney damage.  Eye damage.  Metabolic syndrome.  Memory and concentration problems. What changes can I make to manage my hypertension? Hypertension can be managed by making lifestyle changes and possibly by taking medicines. Your health care provider will help you make a plan to bring your blood pressure within a normal range. Eating and drinking   Eat a diet that is high in fiber and potassium, and low in salt (sodium), added sugar, and fat. An example eating plan is called the DASH (Dietary Approaches to Stop Hypertension) diet. To eat this way: ? Eat plenty of fresh fruits and vegetables. Try to fill half of your plate at each meal with fruits and vegetables. ? Eat whole grains, such as whole wheat pasta, brown rice, or whole grain bread. Fill about one quarter of your plate with whole grains. ? Eat low-fat diary products. ? Avoid fatty cuts of meat, processed or cured meats, and poultry with skin. Fill about one quarter of your plate with lean proteins such as fish, chicken without skin, beans, eggs, and tofu. ? Avoid premade and processed foods. These tend to be higher in sodium, added sugar, and fat.  Reduce your daily sodium intake. Most people with hypertension should eat less than 1,500 mg of sodium a day.  Limit alcohol intake to no more than 1 drink a day for nonpregnant women and 2 drinks a day for men. One drink equals 12 oz of beer, 5 oz of wine, or  1 oz of hard liquor. Lifestyle  Work with your health care provider to maintain a healthy body weight, or to lose weight. Ask what an ideal weight is for you.  Get at least 30 minutes of exercise that causes your heart to beat faster (aerobic exercise) most days of the week. Activities may include walking, swimming, or biking.  Include exercise to strengthen your muscles  (resistance exercise), such as weight lifting, as part of your weekly exercise routine. Try to do these types of exercises for 30 minutes at least 3 days a week.  Do not use any products that contain nicotine or tobacco, such as cigarettes and e-cigarettes. If you need help quitting, ask your health care provider.  Control any long-term (chronic) conditions you have, such as high cholesterol or diabetes. Monitoring  Monitor your blood pressure at home as told by your health care provider. Your personal target blood pressure may vary depending on your medical conditions, your age, and other factors.  Have your blood pressure checked regularly, as often as told by your health care provider. Working with your health care provider  Review all the medicines you take with your health care provider because there may be side effects or interactions.  Talk with your health care provider about your diet, exercise habits, and other lifestyle factors that may be contributing to hypertension.  Visit your health care provider regularly. Your health care provider can help you create and adjust your plan for managing hypertension. Will I need medicine to control my blood pressure? Your health care provider may prescribe medicine if lifestyle changes are not enough to get your blood pressure under control, and if:  Your systolic blood pressure is 130 or higher.  Your diastolic blood pressure is 80 or higher. Take medicines only as told by your health care provider. Follow the directions carefully. Blood pressure medicines must be taken as prescribed. The medicine does not work as well when you skip doses. Skipping doses also puts you at risk for problems. Contact a health care provider if:  You think you are having a reaction to medicines you have taken.  You have repeated (recurrent) headaches.  You feel dizzy.  You have swelling in your ankles.  You have trouble with your vision. Get help right  away if:  You develop a severe headache or confusion.  You have unusual weakness or numbness, or you feel faint.  You have severe pain in your chest or abdomen.  You vomit repeatedly.  You have trouble breathing. Summary  Hypertension is when the force of blood pumping through your arteries is too strong. If this condition is not controlled, it may put you at risk for serious complications.  Your personal target blood pressure may vary depending on your medical conditions, your age, and other factors. For most people, a normal blood pressure is less than 120/80.  Hypertension is managed by lifestyle changes, medicines, or both. Lifestyle changes include weight loss, eating a healthy, low-sodium diet, exercising more, and limiting alcohol. This information is not intended to replace advice given to you by your health care provider. Make sure you discuss any questions you have with your health care provider. Document Revised: 03/26/2019 Document Reviewed: 10/30/2016 Elsevier Patient Education  2020 [employer].

## 2020-02-19 LAB — COMPLETE METABOLIC PANEL WITH GFR
AG Ratio: 1.5 (calc) (ref 1.0–2.5)
ALT: 48 U/L — ABNORMAL HIGH (ref 9–46)
AST: 36 U/L (ref 10–40)
Albumin: 4.2 g/dL (ref 3.6–5.1)
Alkaline phosphatase (APISO): 70 U/L (ref 36–130)
BUN: 12 mg/dL (ref 7–25)
CO2: 27 mmol/L (ref 20–32)
Calcium: 9.7 mg/dL (ref 8.6–10.3)
Chloride: 105 mmol/L (ref 98–110)
Creat: 1.07 mg/dL (ref 0.60–1.35)
GFR, Est African American: 98 mL/min/{1.73_m2} (ref >=60)
GFR, Est Non African American: 85 mL/min/{1.73_m2} (ref >=60)
Globulin: 2.8 g/dL (calc) (ref 1.9–3.7)
Glucose, Bld: 87 mg/dL (ref 65–99)
Potassium: 5 mmol/L (ref 3.5–5.3)
Sodium: 141 mmol/L (ref 135–146)
Total Bilirubin: 0.6 mg/dL (ref 0.2–1.2)
Total Protein: 7 g/dL (ref 6.1–8.1)

## 2020-02-19 LAB — LIPID PANEL
Cholesterol: 123 mg/dL (ref <200)
HDL: 54 mg/dL (ref >=40)
LDL Cholesterol (Calc): 46 mg/dL (calc)
Non-HDL Cholesterol (Calc): 69 mg/dL (calc) (ref <130)
Total CHOL/HDL Ratio: 2.3 (calc) (ref <5.0)
Triglycerides: 144 mg/dL (ref <150)

## 2020-02-19 LAB — TESTOSTERONE: Testosterone: 628 ng/dL (ref 250–827)

## 2020-02-21 ENCOUNTER — Encounter: Payer: Self-pay | Admitting: Family Medicine

## 2020-02-21 NOTE — Assessment & Plan Note (Signed)
Doing well with current dosing of testosterone.  Update testosterone levels today.

## 2020-02-21 NOTE — Progress Notes (Signed)
Dylan Munoz. - 44 y.o. male MRN [id number]  Date of birth: Apr 22, 1976  Subjective Chief Complaint  Patient presents with  . Hypertension    HPI Dylan Munoz. is a 44 y.o. male with history of HTN, HLD and low testosterone here today for follow up.  He reports that he is doing well at this time with no new concerns.    -HTN:  Current management with lisinopril/hctz.  He has not been taking medication regularly  (last rx sent in 03/2019 #90 w1 refill).  He denies symptoms related to HTN including anginal symptoms, shortness of breath, headache or vision changes.  He has been working on losing weight but has been met with difficulty.  He is interested in seeing a dietician.    -HLD:  He is not currently taking anything for management of this.  Last LDL of 46 but was taking atorvastatin at that time.  He tolerated this well previously.    -Low testosterone:  Doing well with current dosing of testosterone.  He denies any problems with injections.  He feels good at current dose.   ROS:  A comprehensive ROS was completed and negative except as noted per HPI   Allergies  Allergen Reactions  . Amlodipine Other (See Comments)    Constipation    Past Medical History:  Diagnosis Date  . High cholesterol   . Hypertension   . Morbid obesity ([place]) 04/17/2016  . Severe obstructive sleep apnea 04/17/2016   AHI 14.9 with desats <88% for >90 mins on SNAP report 04/02/18. See scanned document.  . Testosterone deficiency 04/19/2016  . Vitamin D deficiency 04/19/2016    Past Surgical History:  Procedure Laterality Date  . KNEE ARTHROSCOPY W/ ACL RECONSTRUCTION      Social History   Socioeconomic History  . Marital status: Single    Spouse name: Not on file  . Number of children: Not on file  . Years of education: Not on file  . Highest education level: Not on file  Occupational History  . Not on file  Tobacco Use  . Smoking status: Never Smoker  . Smokeless tobacco: Current User   Types: Chew  Substance and Sexual Activity  . Alcohol use: Yes    Alcohol/week: 0.0 standard drinks    Comment: weekly  . Drug use: No  . Sexual activity: Not on file  Other Topics Concern  . Not on file  Social History Narrative  . Not on file   Social Determinants of Health   Financial Resource Strain:   . Difficulty of Paying Living Expenses: Not on file  Food Insecurity:   . Worried About Charity fundraiser in the Last Year: Not on file  . Ran Out of Food in the Last Year: Not on file  Transportation Needs:   . Lack of Transportation (Medical): Not on file  . Lack of Transportation (Non-Medical): Not on file  Physical Activity:   . Days of Exercise per Week: Not on file  . Minutes of Exercise per Session: Not on file  Stress:   . Feeling of Stress : Not on file  Social Connections:   . Frequency of Communication with Friends and Family: Not on file  . Frequency of Social Gatherings with Friends and Family: Not on file  . Attends Religious Services: Not on file  . Active Member of Clubs or Organizations: Not on file  . Attends Archivist Meetings: Not on file  . Marital Status: Not  on file    Family History  Problem Relation Age of Onset  . Heart disease Father   . Diabetes Paternal Uncle   . Colon cancer Neg Hx   . Esophageal cancer Neg Hx     Health Maintenance  Topic Date Due  . HIV Screening  04/03/1991  . TETANUS/TDAP  04/17/2026  . INFLUENZA VACCINE  Completed     ----------------------------------------------------------------------------------------------------------------------------------------------------------------------------------------------------------------- Physical Exam BP (!) 178/92   Pulse 97   Temp 98.6 F (37 C) (Oral)   Ht _0  (1.803 m)   Wt (!) 328 lb (148.8 kg)   BMI 45.75 kg/m   Physical Exam Constitutional:      Appearance: Normal appearance.  HENT:     Head: Normocephalic and atraumatic.      Mouth/Throat:     Mouth: Mucous membranes are moist.  Eyes:     General: No scleral icterus. Cardiovascular:     Rate and Rhythm: Normal rate and regular rhythm.  Pulmonary:     Effort: Pulmonary effort is normal.     Breath sounds: Normal breath sounds.  Skin:    General: Skin is warm and dry.  Neurological:     General: No focal deficit present.     Mental Status: He is alert.  Psychiatric:        Mood and Affect: Mood normal.        Behavior: Behavior normal.     ------------------------------------------------------------------------------------------------------------------------------------------------------------------------------------------------------------------- Assessment and Plan  HTN (hypertension) Blood pressure is not at goal at for age and co-morbidities.  I recommend he restart lisinopril/hctz at increased dose of 40/14m.  In addition he was instructed to follow a low sodium diet with regular exercise to help to maintain adequate control of blood pressure.   Referral entered to nutritionist.     Hyperlipidemia Update lipid panel  Testosterone deficiency Doing well with current dosing of testosterone.  Update testosterone levels today.    Meds ordered this encounter  Medications  . lisinopril-hydrochlorothiazide (ZESTORETIC) 20-12.5 MG tablet    Sig: Take 2 tablets by mouth daily.    Dispense:  180 tablet    Refill:  1  . testosterone cypionate (DEPOTESTOSTERONE CYPIONATE) 200 MG/ML injection    Sig: Inject 0.5 mLs (100 mg total) into the muscle once a week.    Dispense:  4 mL    Refill:  2    Not to exceed 5 additional fills before 01/15/2020    Return in about 3 months (around 05/20/2020) for HTN/HLD.    This visit occurred during the SARS-CoV-2 public health emergency.  Safety protocols were in place, including screening questions prior to the visit, additional usage of staff PPE, and extensive cleaning of exam room while observing appropriate  contact time as indicated for disinfecting solutions.

## 2020-02-21 NOTE — Assessment & Plan Note (Signed)
Update lipid panel.  

## 2020-02-21 NOTE — Assessment & Plan Note (Addendum)
Blood pressure is not at goal at for age and co-morbidities.  I recommend he restart lisinopril/hctz at increased dose of 40/25mg .  In addition he was instructed to follow a low sodium diet with regular exercise to help to maintain adequate control of blood pressure.   Referral entered to nutritionist.

## 2020-02-24 ENCOUNTER — Encounter: Payer: Self-pay | Admitting: Family Medicine

## 2020-02-25 ENCOUNTER — Encounter: Payer: Self-pay | Admitting: Family Medicine

## 2020-02-25 ENCOUNTER — Other Ambulatory Visit: Payer: Self-pay

## 2020-02-25 ENCOUNTER — Ambulatory Visit (INDEPENDENT_AMBULATORY_CARE_PROVIDER_SITE_OTHER): Payer: Managed Care, Other (non HMO) | Admitting: Family Medicine

## 2020-02-25 VITALS — BP 106/71 | HR 108 | Ht 71.0 in | Wt 318.0 lb

## 2020-02-25 DIAGNOSIS — R34 Anuria and oliguria: Secondary | ICD-10-CM

## 2020-02-25 DIAGNOSIS — R42 Dizziness and giddiness: Secondary | ICD-10-CM | POA: Diagnosis not present

## 2020-02-25 LAB — POCT URINALYSIS DIP (CLINITEK)
Glucose, UA: NEGATIVE mg/dL
Ketones, POC UA: NEGATIVE mg/dL
Leukocytes, UA: NEGATIVE
Nitrite, UA: NEGATIVE
Spec Grav, UA: >=1.03 — AB (ref 1.010–1.025)
Urobilinogen, UA: 0.2 E.U./dL
pH, UA: 5.5 (ref 5.0–8.0)

## 2020-02-25 NOTE — Progress Notes (Signed)
Dylan Munoz. - 44 y.o. male MRN [id number]  Date of birth: July 04, 1976  Subjective Chief Complaint  Patient presents with  . Dizziness    HPI Dylan Munoz. is a 44 y.o. male here today with complaint of dizziness and decreased urination.  He reports that he restarted BP medication last weekend and started having dizziness a couple of days later.  He checked BP at home and reading was low.  He stopped medication on 02/21/20 and started increasing his fluid intake.  He still has had some decreased urine output and but dizziness is improving.  He denies chest pain, flank pain, pain with urination.   ROS:  A comprehensive ROS was completed and negative except as noted per HPI  Allergies  Allergen Reactions  . Amlodipine Other (See Comments)    Constipation    Past Medical History:  Diagnosis Date  . High cholesterol   . Hypertension   . Morbid obesity ([place]) 04/17/2016  . Severe obstructive sleep apnea 04/17/2016   AHI 14.9 with desats <88% for >90 mins on SNAP report 04/02/18. See scanned document.  . Testosterone deficiency 04/19/2016  . Vitamin D deficiency 04/19/2016    Past Surgical History:  Procedure Laterality Date  . KNEE ARTHROSCOPY W/ ACL RECONSTRUCTION      Social History   Socioeconomic History  . Marital status: Single    Spouse name: Not on file  . Number of children: Not on file  . Years of education: Not on file  . Highest education level: Not on file  Occupational History  . Not on file  Tobacco Use  . Smoking status: Never Smoker  . Smokeless tobacco: Current User    Types: Chew  Substance and Sexual Activity  . Alcohol use: Yes    Alcohol/week: 0.0 standard drinks    Comment: weekly  . Drug use: No  . Sexual activity: Not on file  Other Topics Concern  . Not on file  Social History Narrative  . Not on file   Social Determinants of Health   Financial Resource Strain:   . Difficulty of Paying Living Expenses:   Food Insecurity:   . Worried  About Charity fundraiser in the Last Year:   . Arboriculturist in the Last Year:   Transportation Needs:   . Film/video editor (Medical):   Marland Kitchen Lack of Transportation (Non-Medical):   Physical Activity:   . Days of Exercise per Week:   . Minutes of Exercise per Session:   Stress:   . Feeling of Stress :   Social Connections:   . Frequency of Communication with Friends and Family:   . Frequency of Social Gatherings with Friends and Family:   . Attends Religious Services:   . Active Member of Clubs or Organizations:   . Attends Archivist Meetings:   Marland Kitchen Marital Status:     Family History  Problem Relation Age of Onset  . Heart disease Father   . Diabetes Paternal Uncle   . Colon cancer Neg Hx   . Esophageal cancer Neg Hx     Health Maintenance  Topic Date Due  . HIV Screening  02/24/2021 (Originally 04/03/1991)  . TETANUS/TDAP  04/17/2026  . INFLUENZA VACCINE  Completed     ----------------------------------------------------------------------------------------------------------------------------------------------------------------------------------------------------------------- Physical Exam BP 106/71   Pulse (!) 108   Ht 5\' 11"  (1.803 m)   Wt (!) 318 lb (144.2 kg)   BMI 44.35 kg/m   Physical Exam Constitutional:  Appearance: Normal appearance.  HENT:     Head: Normocephalic and atraumatic.     Mouth/Throat:     Mouth: Mucous membranes are moist.  Eyes:     General: No scleral icterus. Cardiovascular:     Rate and Rhythm: Normal rate and regular rhythm.  Pulmonary:     Effort: Pulmonary effort is normal.     Breath sounds: Normal breath sounds.  Skin:    General: Skin is warm and dry.  Neurological:     General: No focal deficit present.     Mental Status: He is alert.  Psychiatric:        Mood and Affect: Mood normal.        Behavior: Behavior normal.      ------------------------------------------------------------------------------------------------------------------------------------------------------------------------------------------------------------------- Assessment and Plan  Dizziness Likely related to hypotension 2/2 to dehydration.  Recommended that he continue to push fluids UA without signs of infection.  Will check renal function and PSA today.  F/u in 2 weeks.    No orders of the defined types were placed in this encounter.   Return in about 2 weeks (around 03/10/2020) for BP f/u.    This visit occurred during the SARS-CoV-2 public health emergency.  Safety protocols were in place, including screening questions prior to the visit, additional usage of staff PPE, and extensive cleaning of exam room while observing appropriate contact time as indicated for disinfecting solutions.

## 2020-02-25 NOTE — Patient Instructions (Addendum)
Stop lisinopril/hctz Increase fluid intake.  We'll be in touch with results of labs.  See me gain in about 2 weeks.

## 2020-02-25 NOTE — Assessment & Plan Note (Signed)
Likely related to hypotension 2/2 to dehydration.  Recommended that he continue to push fluids UA without signs of infection.  Will check renal function and PSA today.  F/u in 2 weeks.

## 2020-02-26 ENCOUNTER — Other Ambulatory Visit: Payer: Self-pay | Admitting: Family Medicine

## 2020-02-26 LAB — PSA: PSA: 0.8 ng/mL (ref <=4.0)

## 2020-02-26 LAB — BASIC METABOLIC PANEL
BUN/Creatinine Ratio: 7 (calc) (ref 6–22)
BUN: 45 mg/dL — ABNORMAL HIGH (ref 7–25)
CO2: 23 mmol/L (ref 20–32)
Calcium: 9 mg/dL (ref 8.6–10.3)
Chloride: 96 mmol/L — ABNORMAL LOW (ref 98–110)
Creat: 6.84 mg/dL — ABNORMAL HIGH (ref 0.60–1.35)
Glucose, Bld: 95 mg/dL (ref 65–99)
Potassium: 4.6 mmol/L (ref 3.5–5.3)
Sodium: 135 mmol/L (ref 135–146)

## 2020-03-01 ENCOUNTER — Other Ambulatory Visit: Payer: Self-pay | Admitting: Family Medicine

## 2020-03-03 ENCOUNTER — Encounter: Payer: Self-pay | Admitting: Family Medicine

## 2020-03-03 ENCOUNTER — Other Ambulatory Visit: Payer: Self-pay | Admitting: Family Medicine

## 2020-03-03 DIAGNOSIS — R7989 Other specified abnormal findings of blood chemistry: Secondary | ICD-10-CM

## 2020-03-03 DIAGNOSIS — E349 Endocrine disorder, unspecified: Secondary | ICD-10-CM

## 2020-03-03 LAB — BASIC METABOLIC PANEL
BUN: 8 mg/dL (ref 7–25)
CO2: 31 mmol/L (ref 20–32)
Calcium: 9 mg/dL (ref 8.6–10.3)
Chloride: 104 mmol/L (ref 98–110)
Creat: 1.09 mg/dL (ref 0.60–1.35)
Glucose, Bld: 98 mg/dL (ref 65–99)
Potassium: 5.1 mmol/L (ref 3.5–5.3)
Sodium: 140 mmol/L (ref 135–146)

## 2020-03-03 NOTE — Progress Notes (Signed)
Attempted to call patient again today regarding renal function and need to repeat labs.

## 2020-03-03 NOTE — Telephone Encounter (Signed)
Patient called.

## 2020-03-03 NOTE — Progress Notes (Signed)
Called patient and he is going to get labs today. No other questions.

## 2020-03-06 MED ORDER — "BD BLUNT FILL NEEDLE 18G X 1-1/2"" MISC": 1 refills | Status: DC

## 2020-03-06 MED ORDER — "BD LUER-LOK SYRINGE 22G X 1-1/2"" 3 ML MISC": 1 refills | Status: DC

## 2020-03-06 NOTE — Addendum Note (Signed)
Addended by: Arvilla Market on: 03/06/2020 09:11 AM   Modules accepted: Orders

## 2020-03-10 ENCOUNTER — Ambulatory Visit (INDEPENDENT_AMBULATORY_CARE_PROVIDER_SITE_OTHER): Payer: Managed Care, Other (non HMO) | Admitting: Family Medicine

## 2020-03-10 ENCOUNTER — Encounter: Payer: Self-pay | Admitting: Family Medicine

## 2020-03-10 DIAGNOSIS — I10 Essential (primary) hypertension: Secondary | ICD-10-CM | POA: Diagnosis not present

## 2020-03-10 DIAGNOSIS — E349 Endocrine disorder, unspecified: Secondary | ICD-10-CM | POA: Diagnosis not present

## 2020-03-10 MED ORDER — ATORVASTATIN CALCIUM 80 MG PO TABS: 80.0000 mg | ORAL_TABLET | Freq: Every day | ORAL | 1 refills | Status: DC

## 2020-03-10 MED ORDER — LISINOPRIL-HYDROCHLOROTHIAZIDE 20-12.5 MG PO TABS: 1.0000 | ORAL_TABLET | Freq: Every day | ORAL | 0 refills | Status: DC

## 2020-03-10 MED ORDER — "BD BLUNT FILL NEEDLE 18G X 1-1/2"" MISC": 1 refills | Status: DC

## 2020-03-10 MED ORDER — "BD LUER-LOK SYRINGE 22G X 1-1/2"" 3 ML MISC": 1 refills | Status: DC

## 2020-03-10 NOTE — Patient Instructions (Signed)
Restart lisinopril/hctz 20/12.5mg  once daily.  Keep an eye on BP at home.  See me again in about 2-3 weeks.    Managing Your Hypertension Hypertension is commonly called high blood pressure. This is when the force of your blood pressing against the walls of your arteries is too strong. Arteries are blood vessels that carry blood from your heart throughout your body. Hypertension forces the heart to work harder to pump blood, and may cause the arteries to become narrow or stiff. Having untreated or uncontrolled hypertension can cause heart attack, stroke, kidney disease, and other problems. What are blood pressure readings? A blood pressure reading consists of a higher number over a lower number. Ideally, your blood pressure should be below 120/80. The first ("top") number is called the systolic pressure. It is a measure of the pressure in your arteries as your heart beats. The second ("bottom") number is called the diastolic pressure. It is a measure of the pressure in your arteries as the heart relaxes. What does my blood pressure reading mean? Blood pressure is classified into four stages. Based on your blood pressure reading, your health care provider may use the following stages to determine what type of treatment you need, if any. Systolic pressure and diastolic pressure are measured in a unit called mm Hg. Normal  Systolic pressure: below 120.  Diastolic pressure: below 80. Elevated  Systolic pressure: 120-129.  Diastolic pressure: below 80. Hypertension stage 1  Systolic pressure: 130-139.  Diastolic pressure: 80-89. Hypertension stage 2  Systolic pressure: 140 or above.  Diastolic pressure: 90 or above. What health risks are associated with hypertension? Managing your hypertension is an important responsibility. Uncontrolled hypertension can lead to:  A heart attack.  A stroke.  A weakened blood vessel (aneurysm).  Heart failure.  Kidney damage.  Eye  damage.  Metabolic syndrome.  Memory and concentration problems. What changes can I make to manage my hypertension? Hypertension can be managed by making lifestyle changes and possibly by taking medicines. Your health care provider will help you make a plan to bring your blood pressure within a normal range. Eating and drinking   Eat a diet that is high in fiber and potassium, and low in salt (sodium), added sugar, and fat. An example eating plan is called the DASH (Dietary Approaches to Stop Hypertension) diet. To eat this way: ? Eat plenty of fresh fruits and vegetables. Try to fill half of your plate at each meal with fruits and vegetables. ? Eat whole grains, such as whole wheat pasta, brown rice, or whole grain bread. Fill about one quarter of your plate with whole grains. ? Eat low-fat diary products. ? Avoid fatty cuts of meat, processed or cured meats, and poultry with skin. Fill about one quarter of your plate with lean proteins such as fish, chicken without skin, beans, eggs, and tofu. ? Avoid premade and processed foods. These tend to be higher in sodium, added sugar, and fat.  Reduce your daily sodium intake. Most people with hypertension should eat less than 1,500 mg of sodium a day.  Limit alcohol intake to no more than 1 drink a day for nonpregnant women and 2 drinks a day for men. One drink equals 12 oz of beer, 5 oz of wine, or 1 oz of hard liquor. Lifestyle  Work with your health care provider to maintain a healthy body weight, or to lose weight. Ask what an ideal weight is for you.  Get at least 30 minutes of exercise  that causes your heart to beat faster (aerobic exercise) most days of the week. Activities may include walking, swimming, or biking.  Include exercise to strengthen your muscles (resistance exercise), such as weight lifting, as part of your weekly exercise routine. Try to do these types of exercises for 30 minutes at least 3 days a week.  Do not use any  products that contain nicotine or tobacco, such as cigarettes and e-cigarettes. If you need help quitting, ask your health care provider.  Control any long-term (chronic) conditions you have, such as high cholesterol or diabetes. Monitoring  Monitor your blood pressure at home as told by your health care provider. Your personal target blood pressure may vary depending on your medical conditions, your age, and other factors.  Have your blood pressure checked regularly, as often as told by your health care provider. Working with your health care provider  Review all the medicines you take with your health care provider because there may be side effects or interactions.  Talk with your health care provider about your diet, exercise habits, and other lifestyle factors that may be contributing to hypertension.  Visit your health care provider regularly. Your health care provider can help you create and adjust your plan for managing hypertension. Will I need medicine to control my blood pressure? Your health care provider may prescribe medicine if lifestyle changes are not enough to get your blood pressure under control, and if:  Your systolic blood pressure is 130 or higher.  Your diastolic blood pressure is 80 or higher. Take medicines only as told by your health care provider. Follow the directions carefully. Blood pressure medicines must be taken as prescribed. The medicine does not work as well when you skip doses. Skipping doses also puts you at risk for problems. Contact a health care provider if:  You think you are having a reaction to medicines you have taken.  You have repeated (recurrent) headaches.  You feel dizzy.  You have swelling in your ankles.  You have trouble with your vision. Get help right away if:  You develop a severe headache or confusion.  You have unusual weakness or numbness, or you feel faint.  You have severe pain in your chest or abdomen.  You vomit  repeatedly.  You have trouble breathing. Summary  Hypertension is when the force of blood pumping through your arteries is too strong. If this condition is not controlled, it may put you at risk for serious complications.  Your personal target blood pressure may vary depending on your medical conditions, your age, and other factors. For most people, a normal blood pressure is less than 120/80.  Hypertension is managed by lifestyle changes, medicines, or both. Lifestyle changes include weight loss, eating a healthy, low-sodium diet, exercising more, and limiting alcohol. This information is not intended to replace advice given to you by your health care provider. Make sure you discuss any questions you have with your health care provider. Document Revised: 03/26/2019 Document Reviewed: 10/30/2016 Elsevier Patient Education  [place].

## 2020-03-10 NOTE — Assessment & Plan Note (Signed)
BP elevated today.   Will restart lisinopril/hctz at 1 tab of 20/12.5mg  daily.  Follow reduced sodium diet.  Follow up in 2 weeks.  He will let me know if having side effects from this including dizziness or fatigue.

## 2020-03-10 NOTE — Progress Notes (Signed)
Dylan Munoz. - 44 y.o. male MRN [id number]  Date of birth: 1976-01-07  Subjective Chief Complaint  Patient presents with  . Follow-up    HPI Dylan Munoz. is a 44 y.o. male is here today for follow up of HTN.  He was previously on lisinopril HCTZ 20/12.5mg  2 tabs daily.  Unfortunately his BP was overtreated with this and he had dehydration and AKI.  He held medication and serum creatinine normalized.  UOP is normal.   He returns today for BP recheck.  BP remains elevated today. He denies chest pain, shortness of breath, palpitations, headache or vision changes.   ROS:  A comprehensive ROS was completed and negative except as noted per HPI  Allergies  Allergen Reactions  . Amlodipine Other (See Comments)    Constipation    Past Medical History:  Diagnosis Date  . High cholesterol   . Hypertension   . Morbid obesity (HCC) 04/17/2016  . Severe obstructive sleep apnea 04/17/2016   AHI 14.9 with desats <88% for >90 mins on SNAP report 04/02/18. See scanned document.  . Testosterone deficiency 04/19/2016  . Vitamin D deficiency 04/19/2016    Past Surgical History:  Procedure Laterality Date  . KNEE ARTHROSCOPY W/ ACL RECONSTRUCTION      Social History   Socioeconomic History  . Marital status: Single    Spouse name: Not on file  . Number of children: Not on file  . Years of education: Not on file  . Highest education level: Not on file  Occupational History  . Not on file  Tobacco Use  . Smoking status: Never Smoker  . Smokeless tobacco: Current User    Types: Chew  Substance and Sexual Activity  . Alcohol use: Yes    Alcohol/week: 0.0 standard drinks    Comment: weekly  . Drug use: No  . Sexual activity: Not on file  Other Topics Concern  . Not on file  Social History Narrative  . Not on file   Social Determinants of Health   Financial Resource Strain:   . Difficulty of Paying Living Expenses:   Food Insecurity:   . Worried About Programme researcher, broadcasting/film/video in  the Last Year:   . Barista in the Last Year:   Transportation Needs:   . Freight forwarder (Medical):   Marland Kitchen Lack of Transportation (Non-Medical):   Physical Activity:   . Days of Exercise per Week:   . Minutes of Exercise per Session:   Stress:   . Feeling of Stress :   Social Connections:   . Frequency of Communication with Friends and Family:   . Frequency of Social Gatherings with Friends and Family:   . Attends Religious Services:   . Active Member of Clubs or Organizations:   . Attends Banker Meetings:   Marland Kitchen Marital Status:     Family History  Problem Relation Age of Onset  . Heart disease Father   . Diabetes Paternal Uncle   . Colon cancer Neg Hx   . Esophageal cancer Neg Hx     Health Maintenance  Topic Date Due  . HIV Screening  02/24/2021 (Originally 04/03/1991)  . TETANUS/TDAP  04/17/2026  . INFLUENZA VACCINE  Completed     ----------------------------------------------------------------------------------------------------------------------------------------------------------------------------------------------------------------- Physical Exam BP (!) 150/90 Comment: manual  Pulse (!) 108   Ht 5\' 11"  (1.803 m)   Wt (!) 322 lb (146.1 kg)   BMI 44.91 kg/m   Physical Exam Constitutional:  Appearance: Normal appearance.  HENT:     Head: Normocephalic and atraumatic.  Eyes:     General: No scleral icterus. Cardiovascular:     Rate and Rhythm: Normal rate and regular rhythm.  Pulmonary:     Effort: Pulmonary effort is normal.     Breath sounds: Normal breath sounds.  Skin:    General: Skin is warm and dry.  Neurological:     General: No focal deficit present.     Mental Status: He is alert.  Psychiatric:        Mood and Affect: Mood normal.        Behavior: Behavior normal.      ------------------------------------------------------------------------------------------------------------------------------------------------------------------------------------------------------------------- Assessment and Plan  HTN (hypertension) BP elevated today.   Will restart lisinopril/hctz at 1 tab of 20/12.5mg  daily.  Follow reduced sodium diet.  Follow up in 2 weeks.  He will let me know if having side effects from this including dizziness or fatigue.     No orders of the defined types were placed in this encounter.   No follow-ups on file.    This visit occurred during the SARS-CoV-2 public health emergency.  Safety protocols were in place, including screening questions prior to the visit, additional usage of staff PPE, and extensive cleaning of exam room while observing appropriate contact time as indicated for disinfecting solutions.

## 2020-03-17 ENCOUNTER — Other Ambulatory Visit: Payer: Self-pay | Admitting: Family Medicine

## 2020-03-22 MED ORDER — KETOROLAC TROMETHAMINE 15 MG/ML IJ SOLN
15.00 | INTRAMUSCULAR | Status: DC
Start: ? — End: 2020-03-22

## 2020-03-22 MED ORDER — SODIUM CHLORIDE 0.9 % IV SOLN
INTRAVENOUS | Status: DC
Start: ? — End: 2020-03-22

## 2020-03-22 MED ORDER — ACETAMINOPHEN 325 MG PO TABS
650.00 | ORAL_TABLET | ORAL | Status: DC
Start: ? — End: 2020-03-22

## 2020-03-22 MED ORDER — LORAZEPAM 1 MG PO TABS
2.00 | ORAL_TABLET | ORAL | Status: DC
Start: ? — End: 2020-03-22

## 2020-03-22 MED ORDER — ATORVASTATIN CALCIUM 40 MG PO TABS
80.00 | ORAL_TABLET | ORAL | Status: DC
Start: 2020-03-22 — End: 2020-03-22

## 2020-03-22 MED ORDER — GENERIC EXTERNAL MEDICATION
3.38 | Status: DC
Start: 2020-03-22 — End: 2020-03-22

## 2020-03-22 MED ORDER — QUINTABS PO TABS
1.00 | ORAL_TABLET | ORAL | Status: DC
Start: 2020-03-23 — End: 2020-03-22

## 2020-03-22 MED ORDER — THIAMINE HCL 100 MG PO TABS
100.00 | ORAL_TABLET | ORAL | Status: DC
Start: 2020-03-23 — End: 2020-03-22

## 2020-03-22 MED ORDER — OXYCODONE HCL 5 MG PO TABS
10.00 | ORAL_TABLET | ORAL | Status: DC
Start: ? — End: 2020-03-22

## 2020-03-22 MED ORDER — MELATONIN 3 MG PO TABS
3.00 | ORAL_TABLET | ORAL | Status: DC
Start: ? — End: 2020-03-22

## 2020-03-22 MED ORDER — HYDROMORPHONE HCL 1 MG/ML IJ SOLN
1.00 | INTRAMUSCULAR | Status: DC
Start: ? — End: 2020-03-22

## 2020-03-22 MED ORDER — LISINOPRIL 20 MG PO TABS
20.00 | ORAL_TABLET | ORAL | Status: DC
Start: 2020-03-23 — End: 2020-03-22

## 2020-03-22 MED ORDER — ONDANSETRON HCL 4 MG/2ML IJ SOLN
4.00 | INTRAMUSCULAR | Status: DC
Start: ? — End: 2020-03-22

## 2020-03-22 MED ORDER — SODIUM CHLORIDE FLUSH 0.9 % IV SOLN
5.00 | INTRAVENOUS | Status: DC
Start: 2020-03-22 — End: 2020-03-22

## 2020-03-22 MED ORDER — DSS 100 MG PO CAPS
100.00 | ORAL_CAPSULE | ORAL | Status: DC
Start: ? — End: 2020-03-22

## 2020-03-22 MED ORDER — SENNOSIDES-DOCUSATE SODIUM 8.6-50 MG PO TABS
1.00 | ORAL_TABLET | ORAL | Status: DC
Start: 2020-03-22 — End: 2020-03-22

## 2020-03-22 MED ORDER — FAMOTIDINE 20 MG/2ML IV SOLN
20.00 | INTRAVENOUS | Status: DC
Start: 2020-03-22 — End: 2020-03-22

## 2020-03-22 MED ORDER — ASCORBIC ACID 500 MG PO TABS
500.00 | ORAL_TABLET | ORAL | Status: DC
Start: 2020-03-23 — End: 2020-03-22

## 2020-03-22 MED ORDER — SIMETHICONE 80 MG PO CHEW
80.00 | CHEWABLE_TABLET | ORAL | Status: DC
Start: 2020-03-22 — End: 2020-03-22

## 2020-03-22 MED ORDER — GENERIC EXTERNAL MEDICATION
2.00 | Status: DC
Start: ? — End: 2020-03-22

## 2020-03-22 MED ORDER — SODIUM CHLORIDE FLUSH 0.9 % IV SOLN
5.00 | INTRAVENOUS | Status: DC
Start: ? — End: 2020-03-22

## 2020-03-22 MED ORDER — FOLIC ACID 1 MG PO TABS
1.00 | ORAL_TABLET | ORAL | Status: DC
Start: 2020-03-23 — End: 2020-03-22

## 2020-03-22 MED ORDER — ENOXAPARIN SODIUM 40 MG/0.4ML ~~LOC~~ SOLN
40.00 | SUBCUTANEOUS | Status: DC
Start: 2020-03-22 — End: 2020-03-22

## 2020-03-22 MED ORDER — METOPROLOL TARTRATE 25 MG PO TABS
25.00 | ORAL_TABLET | ORAL | Status: DC
Start: 2020-03-22 — End: 2020-03-22

## 2020-03-25 ENCOUNTER — Encounter: Payer: Self-pay | Admitting: Family Medicine

## 2020-03-27 ENCOUNTER — Ambulatory Visit: Payer: Managed Care, Other (non HMO) | Admitting: Family Medicine

## 2020-03-31 ENCOUNTER — Ambulatory Visit: Payer: Managed Care, Other (non HMO) | Admitting: Family Medicine

## 2020-03-31 ENCOUNTER — Other Ambulatory Visit: Payer: Self-pay

## 2020-03-31 ENCOUNTER — Encounter: Payer: Self-pay | Admitting: Family Medicine

## 2020-03-31 VITALS — BP 128/88 | HR 98 | Ht 71.0 in | Wt 309.0 lb

## 2020-03-31 DIAGNOSIS — I10 Essential (primary) hypertension: Secondary | ICD-10-CM

## 2020-03-31 DIAGNOSIS — K852 Alcohol induced acute pancreatitis without necrosis or infection: Secondary | ICD-10-CM

## 2020-03-31 DIAGNOSIS — R000 Tachycardia, unspecified: Secondary | ICD-10-CM | POA: Diagnosis not present

## 2020-03-31 MED ORDER — DOCUSATE SODIUM 100 MG PO CAPS: 100.0000 mg | ORAL_CAPSULE | Freq: Two times a day (BID) | ORAL | 0 refills | Status: DC

## 2020-03-31 NOTE — Progress Notes (Signed)
Dylan Munoz. - 44 y.o. male MRN [id number]  Date of birth: [birth year]  Subjective Chief Complaint  Patient presents with  . Follow-up    HPI Dylan Munoz. is a 44 y.o. male with history of HTN, HLD, low testosterone here today for hospital follow up.  He was admitted to [facility] from 4/5-03/24/2020.  He had initial complaint of chest pain and was diagnosed with acute pancreatitis. Based on history it seemed that pancreatitis was 2/2 to EtOH use.  He was admitted and monitored closely. ID and surgery consulted, ID felt no infectious cause and surgery decided no surgical intervention warranted.    He was tachycardic at admission, no response to fluid bolus. Metoprolol added and hctz discontinued.    At discharge lipase was trending down and had normalized.  He was d/c with oxycodone as needed for pain.  He does report some constipation.  He has not tried anything for this.  He has gotten his appetite back for the most part.  He denies nausea or worsening abdominal pain.   ROS:  A comprehensive ROS was completed and negative except as noted per HPI   Allergies  Allergen Reactions  . Amlodipine Other (See Comments)    Constipation    Past Medical History:  Diagnosis Date  . High cholesterol   . Hypertension   . Morbid obesity (HCC) 04/17/2016  . Severe obstructive sleep apnea 04/17/2016   AHI 14.9 with desats <88% for >90 mins on SNAP report 04/02/18. See scanned document.  . Testosterone deficiency 04/19/2016  . Vitamin D deficiency 04/19/2016    Past Surgical History:  Procedure Laterality Date  . KNEE ARTHROSCOPY W/ ACL RECONSTRUCTION      Social History   Socioeconomic History  . Marital status: Single    Spouse name: Not on file  . Number of children: Not on file  . Years of education: Not on file  . Highest education level: Not on file  Occupational History  . Not on file  Tobacco Use  . Smoking status: Never Smoker  . Smokeless tobacco: Current User     Types: Chew  Substance and Sexual Activity  . Alcohol use: Yes    Alcohol/week: 0.0 standard drinks    Comment: weekly  . Drug use: No  . Sexual activity: Not on file  Other Topics Concern  . Not on file  Social History Narrative  . Not on file   Social Determinants of Health   Financial Resource Strain:   . Difficulty of Paying Living Expenses:   Food Insecurity:   . Worried About Programme researcher, broadcasting/film/video in the Last Year:   . Barista in the Last Year:   Transportation Needs:   . Freight forwarder (Medical):   Marland Kitchen Lack of Transportation (Non-Medical):   Physical Activity:   . Days of Exercise per Week:   . Minutes of Exercise per Session:   Stress:   . Feeling of Stress :   Social Connections:   . Frequency of Communication with Friends and Family:   . Frequency of Social Gatherings with Friends and Family:   . Attends Religious Services:   . Active Member of Clubs or Organizations:   . Attends Banker Meetings:   Marland Kitchen Marital Status:     Family History  Problem Relation Age of Onset  . Heart disease Father   . Diabetes Paternal Uncle   . Colon cancer Neg Hx   .  Esophageal cancer Neg Hx     Health Maintenance  Topic Date Due  . HIV Screening  02/24/2021 (Originally 04/03/1991)  . INFLUENZA VACCINE  07/16/2020  . TETANUS/TDAP  04/17/2026     ----------------------------------------------------------------------------------------------------------------------------------------------------------------------------------------------------------------- Physical Exam BP 128/88   Pulse 98   Ht 5\' 11"  (1.803 m)   Wt (!) 309 lb (140.2 kg)   BMI 43.10 kg/m   Physical Exam Constitutional:      Appearance: Normal appearance.  HENT:     Head: Normocephalic and atraumatic.     Mouth/Throat:     Mouth: Mucous membranes are moist.  Eyes:     General: No scleral icterus. Cardiovascular:     Rate and Rhythm: Normal rate and regular rhythm.   Pulmonary:     Effort: Pulmonary effort is normal.     Breath sounds: Normal breath sounds.  Musculoskeletal:     Cervical back: Neck supple.  Skin:    General: Skin is warm and dry.  Neurological:     General: No focal deficit present.     Mental Status: He is alert.  Psychiatric:        Mood and Affect: Mood normal.        Behavior: Behavior normal.     ------------------------------------------------------------------------------------------------------------------------------------------------------------------------------------------------------------------- Assessment and Plan  HTN (hypertension) Blood pressure is at goal at for age and co-morbidities.  I recommend he continue metoprolol and lisinopril.  In addition they were instructed to follow a low sodium diet with regular exercise to help to maintain adequate control of blood pressure.    Tachycardia Continued episodes of tachycardia.  Denies other symptoms including dizziness.  Will refer to cardiology per patient request.   Alcohol-induced acute pancreatitis without infection or necrosis Symptoms improved, appetite better.  Having some constipation, likely 2/2 to opioids used for pain control.  Recommend addition of stool softener.  Stressed importance of avoiding EtOH.   Orders Placed This Encounter  Procedures  . COMPLETE METABOLIC PANEL WITH GFR  . CBC With Differential  . Lipase  . CBC with Differential  . Ambulatory referral to Cardiology    Referral Priority:   Routine    Referral Type:   Consultation    Referral Reason:   Specialty Services Required    Requested Specialty:   Cardiology    Number of Visits Requested:   1    Meds ordered this encounter  Medications  . docusate sodium (COLACE) 100 MG capsule    Sig: Take 1 capsule (100 mg total) by mouth 2 (two) times daily.    Dispense:  30 capsule    Refill:  0    No follow-ups on file.    This visit occurred during the SARS-CoV-2 public  health emergency.  Safety protocols were in place, including screening questions prior to the visit, additional usage of staff PPE, and extensive cleaning of exam room while observing appropriate contact time as indicated for disinfecting solutions.

## 2020-03-31 NOTE — Patient Instructions (Addendum)
Nice to see you today! Stop by the lab and have labs completed. We'll be in touch with results.  Try colace for constipation.  Be sure to get plenty of fluids.  You should hear from cardiology to set up appointment.    Pancreatitis Eating Plan Pancreatitis is when your pancreas becomes irritated and swollen (inflamed). The pancreas is a small organ located behind your stomach. It helps your body digest food and regulate your blood sugar. Pancreatitis can affect how your body digests food, especially foods with fat. You may also have other symptoms such as abdominal pain or nausea. When you have pancreatitis, following a low-fat eating plan may help you manage symptoms and recover more quickly. Work with your health care provider or a diet and nutrition specialist (dietitian) to create an eating plan that is right for you. What are tips for following this plan? Reading food labels Use the information on food labels to help keep track of how much fat you eat:  Check the serving size.  Look for the amount of total fat in grams (g) in one serving. ? Low-fat foods have 3 g of fat or less per serving. ? Fat-free foods have 0.5 g of fat or less per serving.  Keep track of how much fat you eat based on how many servings you eat. ? For example, if you eat two servings, the amount of fat you eat will be two times what is listed on the label. Shopping   Buy low-fat or nonfat foods, such as: ? Fresh, frozen, or canned fruits and vegetables. ? Grains, including pasta, bread, and rice. ? Lean meat, poultry, fish, and other protein foods. ? Low-fat or nonfat dairy.  Avoid buying bakery products and other sweets made with whole milk, butter, and eggs.  Avoid buying snack foods with added fat, such as anything with butter or cheese flavoring. Cooking  Remove skin from poultry, and remove extra fat from meat.  Limit the amount of fat and oil you use to 6 teaspoons or less per day.  Cook using  low-fat methods, such as boiling, broiling, grilling, steaming, or baking.  Use spray oil to cook. Add fat-free chicken broth to add flavor and moisture.  Avoid adding cream to thicken soups or sauces. Use other thickeners such as corn starch or tomato paste. Meal planning   Eat a low-fat diet as told by your dietitian. For most people, this means having no more than 55-65 grams of fat each day.  Eat small, frequent meals throughout the day. For example, you may have 5-6 small meals instead of 3 large meals.  Drink enough fluid to keep your urine pale yellow.  Do not drink alcohol. Talk to your health care provider if you need help stopping.  Limit how much caffeine you have, including black coffee, black and green tea, caffeinated soft drinks, and energy drinks. General information  Let your health care provider or dietitian know if you have unplanned weight loss on this eating plan.  You may be instructed to follow a clear liquid diet during a flare of symptoms. Talk with your health care provider about how to manage your diet during symptoms of a flare.  Take any vitamins or supplements as told by your health care provider.  Work with a Microbiologist, especially if you have other conditions such as obesity or diabetes mellitus. What foods should I avoid? Fruits Fried fruits. Fruits served with butter or cream. Vegetables Fried vegetables. Vegetables cooked with butter,  cheese, or cream. Grains Biscuits, waffles, donuts, pastries, and croissants. Pies and cookies. Butter-flavored popcorn. Regular crackers. Meats and other protein foods Fatty cuts of meat. Poultry with skin. Organ meats. Bacon, sausage, and cold cuts. Whole eggs. Nuts and nut butters. Dairy Whole and 2% milk. Whole milk yogurt. Whole milk ice cream. Cream and half-and-half. Cream cheese. Sour cream. Cheese. Beverages Wine, beer, and liquor. The items listed above may not be a complete list of foods and beverages  to avoid. Contact a dietitian for more information. Summary  Pancreatitis can affect how your body digests food, especially foods with fat.  When you have pancreatitis, it is recommended that you follow a low-fat eating plan to help you recover more quickly and manage symptoms. For most people, this means limiting fat to no more than 55-65 grams per day.  Do not drink alcohol. Limit the amount of caffeine you have, and drink enough fluid to keep your urine pale yellow. This information is not intended to replace advice given to you by your health care provider. Make sure you discuss any questions you have with your health care provider. Document Revised: 03/25/2019 Document Reviewed: 03/10/2018 Elsevier Patient Education  2020 [employer].

## 2020-04-01 ENCOUNTER — Other Ambulatory Visit: Payer: Self-pay | Admitting: Sports Medicine

## 2020-04-01 DIAGNOSIS — E349 Endocrine disorder, unspecified: Secondary | ICD-10-CM

## 2020-04-01 LAB — COMPLETE METABOLIC PANEL WITH GFR
AG Ratio: 1.1 (calc) (ref 1.0–2.5)
ALT: 41 U/L (ref 9–46)
AST: 21 U/L (ref 10–40)
Albumin: 3.7 g/dL (ref 3.6–5.1)
Alkaline phosphatase (APISO): 96 U/L (ref 36–130)
BUN: 11 mg/dL (ref 7–25)
CO2: 30 mmol/L (ref 20–32)
Calcium: 9.8 mg/dL (ref 8.6–10.3)
Chloride: 100 mmol/L (ref 98–110)
Creat: 0.97 mg/dL (ref 0.60–1.35)
GFR, Est African American: 110 mL/min/{1.73_m2} (ref >=60)
GFR, Est Non African American: 95 mL/min/{1.73_m2} (ref >=60)
Globulin: 3.3 g/dL (calc) (ref 1.9–3.7)
Glucose, Bld: 128 mg/dL — ABNORMAL HIGH (ref 65–99)
Potassium: 4.8 mmol/L (ref 3.5–5.3)
Sodium: 138 mmol/L (ref 135–146)
Total Bilirubin: 0.5 mg/dL (ref 0.2–1.2)
Total Protein: 7 g/dL (ref 6.1–8.1)

## 2020-04-01 LAB — CBC WITH DIFFERENTIAL/PLATELET
Absolute Monocytes: 605 cells/uL (ref 200–950)
Basophils Absolute: 33 cells/uL (ref 0–200)
Basophils Relative: 0.3 %
Eosinophils Absolute: 143 cells/uL (ref 15–500)
Eosinophils Relative: 1.3 %
HCT: 43.6 % (ref 38.5–50.0)
Hemoglobin: 14.9 g/dL (ref 13.2–17.1)
Lymphs Abs: 1650 cells/uL (ref 850–3900)
MCH: 31.8 pg (ref 27.0–33.0)
MCHC: 34.2 g/dL (ref 32.0–36.0)
MCV: 93 fL (ref 80.0–100.0)
MPV: 9.1 fL (ref 7.5–12.5)
Monocytes Relative: 5.5 %
Neutro Abs: 8569 cells/uL — ABNORMAL HIGH (ref 1500–7800)
Neutrophils Relative %: 77.9 %
Platelets: 592 10*3/uL — ABNORMAL HIGH (ref 140–400)
RBC: 4.69 10*6/uL (ref 4.20–5.80)
RDW: 13.7 % (ref 11.0–15.0)
Total Lymphocyte: 15 %
WBC: 11 10*3/uL — ABNORMAL HIGH (ref 3.8–10.8)

## 2020-04-01 LAB — LIPASE: Lipase: 49 U/L (ref 7–60)

## 2020-04-02 NOTE — Telephone Encounter (Signed)
To PCP

## 2020-04-03 ENCOUNTER — Encounter: Payer: Self-pay | Admitting: Family Medicine

## 2020-04-03 DIAGNOSIS — R000 Tachycardia, unspecified: Secondary | ICD-10-CM | POA: Insufficient documentation

## 2020-04-03 DIAGNOSIS — K852 Alcohol induced acute pancreatitis without necrosis or infection: Secondary | ICD-10-CM | POA: Insufficient documentation

## 2020-04-03 NOTE — Assessment & Plan Note (Signed)
Blood pressure is at goal at for age and co-morbidities.  I recommend he continue metoprolol and lisinopril.  In addition they were instructed to follow a low sodium diet with regular exercise to help to maintain adequate control of blood pressure.

## 2020-04-03 NOTE — Assessment & Plan Note (Signed)
Continued episodes of tachycardia.  Denies other symptoms including dizziness.  Will refer to cardiology per patient request.

## 2020-04-03 NOTE — Assessment & Plan Note (Signed)
Symptoms improved, appetite better.  Having some constipation, likely 2/2 to opioids used for pain control.  Recommend addition of stool softener.  Stressed importance of avoiding EtOH.

## 2020-04-04 ENCOUNTER — Other Ambulatory Visit: Payer: Self-pay

## 2020-04-04 ENCOUNTER — Ambulatory Visit (INDEPENDENT_AMBULATORY_CARE_PROVIDER_SITE_OTHER): Payer: Managed Care, Other (non HMO)

## 2020-04-04 ENCOUNTER — Ambulatory Visit (INDEPENDENT_AMBULATORY_CARE_PROVIDER_SITE_OTHER): Payer: Managed Care, Other (non HMO) | Admitting: Cardiology

## 2020-04-04 ENCOUNTER — Encounter: Payer: Self-pay | Admitting: Cardiology

## 2020-04-04 VITALS — BP 98/68 | HR 76 | Ht 71.0 in | Wt 305.0 lb

## 2020-04-04 DIAGNOSIS — K852 Alcohol induced acute pancreatitis without necrosis or infection: Secondary | ICD-10-CM

## 2020-04-04 DIAGNOSIS — R002 Palpitations: Secondary | ICD-10-CM

## 2020-04-04 DIAGNOSIS — I159 Secondary hypertension, unspecified: Secondary | ICD-10-CM

## 2020-04-04 DIAGNOSIS — R000 Tachycardia, unspecified: Secondary | ICD-10-CM | POA: Diagnosis not present

## 2020-04-04 DIAGNOSIS — E782 Mixed hyperlipidemia: Secondary | ICD-10-CM

## 2020-04-04 DIAGNOSIS — I10 Essential (primary) hypertension: Secondary | ICD-10-CM

## 2020-04-04 NOTE — Progress Notes (Signed)
Cardiology Consultation:    Date:  04/04/2020   ID:  Dylan Munoz., DOB 22-Jan-1976, MRN [id number]  PCP:  Luetta Nutting, DO  Cardiologist:  Jenne Campus, MD   Referring MD: Luetta Nutting, DO   Chief Complaint  Patient presents with  . New Patient (Initial Visit)    History of Present Illness:    Dylan Munoz. is a 44 y.o. male who is being seen today for the evaluation of palpitations/tachycardia at the request of Luetta Nutting, DO.  With past medical history significant for hypertension, dyslipidemia, obesity, severe sleep apnea.  Recently he ended up going to [facility] hospital because of abdominal pain.  He was find to have pancreatitis without necrosis and without infection.  No surgical intervention needed.  He also noted his heart rate was elevated while he was in the hospital.  He was discharged home he does have an apple watch and he noted his heart rate being persistently high.  He is worried about it and that is why he wants to be seen.  Otherwise he said he is overall recovered from the event of pancreatitis.  His pancreatitis was related to alcohol.  He used to drink a lot of whiskey.  Since the time of last hospitalization he completely stopped.  Denies having any shortness of breath chest pain tightness squeezing pressure burning chest no swelling of lower extremities.  No paroxysmal nocturnal dyspnea however he does have sleep apnea for which he uses CPAP. He does not smoke but he is some dip. His father actually end up having a heart problem before age of 28, he does not know details. He used to drink a lot of alcohol but apparently quit when he was last time in the hospital. 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 High cholesterol   . Hypertension   . Morbid obesity ([place]) 04/17/2016  . Severe obstructive sleep apnea 04/17/2016   AHI 14.9 with desats <88% for >90 mins on SNAP report 04/02/18. See scanned document.  . Testosterone deficiency 04/19/2016  . Vitamin D deficiency 04/19/2016    Past Surgical History:  Procedure Laterality Date  . KNEE ARTHROSCOPY W/ ACL RECONSTRUCTION      Current Medications: Current Meds  Medication Sig  . Alcohol Swabs (ALCOHOL PREP) PADS Use to clean skin and vials for testosterone injections.  . AMBULATORY NON FORMULARY MEDICATION Please set CPAP to auto-titration from 10-20.  \  Adjust mask fit.  Send 30 day compliance report 30 days after change is made.   Send to [facility]  . atorvastatin (LIPITOR) 80 MG tablet Take 1 tablet (80 mg total) by mouth daily.  Marland Kitchen docusate sodium (COLACE) 100 MG capsule Take 1 capsule (100 mg total) by mouth 2 (two) times daily.  Marland Kitchen lisinopril (ZESTRIL) 20 MG tablet Take 1 tablet by mouth.  . metoprolol tartrate (LOPRESSOR) 25 MG tablet Take by mouth.  Marland Kitchen NEEDLE, DISP, 18 G (B-D BLUNT FILL NEEDLE) 18G X 1-1/2" MISC Use to draw up testosterone weekly  . SYRINGE-NEEDLE, DISP, 3 ML (B-D 3CC LUER-LOK SYR 22GX1-1/2) 22G X 1-1/2" 3 ML MISC USE TO ADMINISTER TESTOSTERONE WEEKLY  . testosterone cypionate (DEPOTESTOSTERONE CYPIONATE) 200 MG/ML injection INJECT 0.5 MLS (100 MG TOTAL) INTO THE MUSCLE ONCE A WEEK.  . Vitamin D, Ergocalciferol, (DRISDOL) 1.25 MG (50000 UNIT) CAPS capsule Take  50,000 Units by mouth every 7 (seven) days.     Allergies:   Amlodipine   Social History   Socioeconomic History  . Marital status: Single    Spouse name: Not on file  . Number of children: Not on file  . Years of education: Not on file  . Highest education level: Not on file  Occupational History  . Not on file  Tobacco Use  . Smoking status: Never Smoker  . Smokeless tobacco: Current User    Types: Chew  Substance and Sexual Activity  . Alcohol use: Yes    Alcohol/week: 0.0 standard drinks    Comment: weekly  . Drug use: No  . Sexual activity: Not on file  Other  Topics Concern  . Not on file  Social History Narrative  . Not on file   Social Determinants of Health   Financial Resource Strain:   . Difficulty of Paying Living Expenses:   Food Insecurity:   . Worried About Programme researcher, broadcasting/film/video in the Last Year:   . Barista in the Last Year:   Transportation Needs:   . Freight forwarder (Medical):   Marland Kitchen Lack of Transportation (Non-Medical):   Physical Activity:   . Days of Exercise per Week:   . Minutes of Exercise per Session:   Stress:   . Feeling of Stress :   Social Connections:   . Frequency of Communication with Friends and Family:   . Frequency of Social Gatherings with Friends and Family:   . Attends Religious Services:   . Active Member of Clubs or Organizations:   . Attends Banker Meetings:   Marland Kitchen Marital Status:      Family History: The patient's family history includes Diabetes in his paternal uncle; Heart disease in his father. There is no history of Colon cancer or Esophageal cancer. ROS:   Please see the history of present illness.    All 14 point review of systems negative except as described per history of present illness.  EKGs/Labs/Other Studies Reviewed:    The following studies were reviewed today: Records from [facility] hospital reviewed, no echocardiogram.  EKG:  EKG is  ordered today.  The ekg ordered today demonstrates sinus tachycardia rate 111, normal P interval, nonspecific ST segment changes  Recent Labs: 03/31/2020: ALT 41; BUN 11; Creat 0.97; Hemoglobin 14.9; Platelets 592; Potassium 4.8; Sodium 138  Recent Lipid Panel    Component Value Date/Time   CHOL 123 02/18/2020 1154   TRIG 144 02/18/2020 1154   HDL 54 02/18/2020 1154   CHOLHDL 2.3 02/18/2020 1154   VLDL 37 (H) 04/17/2016 0836   LDLCALC 46 02/18/2020 1154    Physical Exam:    VS:  BP 98/68   Pulse 76   Ht 5\' 11"  (1.803 m)   Wt (!) 305 lb (138.3 kg)   SpO2 97%   BMI 42.54 kg/m     Wt Readings from  Last 3 Encounters:  04/04/20 (!) 305 lb (138.3 kg)  03/31/20 (!) 309 lb (140.2 kg)  03/10/20 (!) 322 lb (146.1 kg)     GEN:  Well nourished, well developed in no acute distress HEENT: Normal NECK: No JVD; No carotid bruits LYMPHATICS: No lymphadenopathy CARDIAC: RRR, tachycardic no murmurs, no rubs, no gallops RESPIRATORY:  Clear to auscultation without rales, wheezing or rhonchi  ABDOMEN: Soft, non-tender, non-distended MUSCULOSKELETAL:  No edema; No deformity  SKIN: Warm and dry NEUROLOGIC:  Alert and oriented x  3 PSYCHIATRIC:  Normal affect   ASSESSMENT:    1. Sinus tachycardia   2. Secondary hypertension   3. Essential hypertension   4. Palpitations   5. Tachycardia   6. Morbid obesity (HCC)   7. Mixed hyperlipidemia   8. Alcohol-induced acute pancreatitis without infection or necrosis    PLAN:    In order of problems listed above:  1. Sinus tachycardia.  Would look for secondary causes for his tachycardia.  Was concerns about potential having cardiomyopathy related to alcohol.  I will ask to have echocardiogram to assess left ventricle ejection fraction.  I will also ask you to wear Zio patch for a week to see exactly what kind of tachycardia with dealing with and was the heart rate variability within 24 hours since.  I did review his laboratory test.  He did have a TSH done which was normal, he was not significantly anemic.  I meet again with him in about 6 weeks and will discuss those issues. 2. Hypertension.  His blood pressure today was low at 98/68.  I advised him to be well-hydrated. 3. Mixed dyslipidemia. 4. When I see him we most likely will do fasting lipid profile. 5. Alcohol induced pancreatitis.  Recovering.  No pain right now he stopped drinking alcohol.   Medication Adjustments/Labs and Tests Ordered: Current medicines are reviewed at length with the patient today.  Concerns regarding medicines are outlined above.  Orders Placed This Encounter  Procedures   . LONG TERM MONITOR (3-14 DAYS)  . EKG 12-Lead  . ECHOCARDIOGRAM COMPLETE   No orders of the defined types were placed in this encounter.   Signed, Georgeanna Lea, MD,  [facility]. 04/04/2020 4:12 PM    [facility]

## 2020-04-04 NOTE — Patient Instructions (Addendum)
Medication Instructions:  Your physician recommends that you continue on your current medications as directed. Please refer to the Current Medication list given to you today.  *If you need a refill on your cardiac medications before your next appointment, please call your pharmacy*   Lab Work: None ordered If you have labs (blood work) drawn today and your tests are completely normal, you will receive your results only by: Marland Kitchen MyChart Message (if you have MyChart) OR . A paper copy in the mail If you have any lab test that is abnormal or we need to change your treatment, we will call you to review the results.   Testing/Procedures: Your physician has requested that you have an echocardiogram. Echocardiography is a painless test that uses sound waves to create images of your heart. It provides your doctor with information about the size and shape of your heart and how well your heart's chambers and valves are working. This procedure takes approximately one hour. There are no restrictions for this procedure.   ZIO XT- Long Term Monitor Instructions   Your physician has requested you wear your ZIO patch monitor 7 days.   This is a single patch monitor.  Irhythm supplies one patch monitor per enrollment.  Additional stickers are not available.   Please do not apply patch if you will be having a Nuclear Stress Test, Echocardiogram, Cardiac CT, MRI, or Chest Xray during the time frame you would be wearing the monitor. The patch cannot be worn during these tests.  You cannot remove and re-apply the ZIO XT patch monitor.   Once you have received you monitor, please review enclosed instructions.  Your monitor has already been registered assigning a specific monitor serial # to you.   Applying the monitor   Shave hair from upper left chest.   Hold abrader disc by orange tab.  Rub abrader in 40 strokes over left upper chest as indicated in your monitor instructions.   Clean area with 4 enclosed  alcohol pads .  Use all pads to assure are is cleaned thoroughly.  Let dry.   Apply patch as indicated in monitor instructions.  Patch will be place under collarbone on left side of chest with arrow pointing upward.   Rub patch adhesive wings for 2 minutes.Remove white label marked "1".  Remove white label marked "2".  Rub patch adhesive wings for 2 additional minutes.   While looking in a mirror, press and release button in center of patch.  A small green light will flash 3-4 times .  This will be your only indicator the monitor has been turned on.     Do not shower for the first 24 hours.  You may shower after the first 24 hours.   Press button if you feel a symptom. You will hear a small click.  Record Date, Time and Symptom in the Patient Log Book.   When you are ready to remove patch, follow instructions on last 2 pages of Patient Log Book.  Stick patch monitor onto last page of Patient Log Book.   Place Patient Log Book in [place] box.  Use locking tab on box and tape box closed securely.  The [employer] and [employer] has [employer] on it.  Please place in mailbox as soon as possible.  Your physician should have your test results approximately 7 days after the monitor has been mailed back to [facility].   Call [facility] Customer Care at [phone] if you have questions regarding  your ZIO XT patch monitor.  Call them immediately if you see an orange light blinking on your monitor.   If your monitor falls off in less than 4 days contact our Monitor department at [phone].  If your monitor becomes loose or falls off after 4 days call [employer] at [phone] for suggestions on securing your monitor.     Follow-Up: At [facility], you and your health needs are our priority.  As part of our continuing mission to provide you with exceptional heart care, we have created designated Provider Care Teams.  These Care Teams include your primary Cardiologist (physician) and Advanced  Practice Providers (APPs -  Physician Assistants and Nurse Practitioners) who all work together to provide you with the care you need, when you need it.  We recommend signing up for the patient portal called "MyChart".  Sign up information is provided on this After Visit Summary.  MyChart is used to connect with patients for Virtual Visits (Telemedicine).  Patients are able to view lab/test results, encounter notes, upcoming appointments, etc.  Non-urgent messages can be sent to your provider as well.   To learn more about what you can do with MyChart, go to [email or web address].    Your next appointment:   6 week(s)  The format for your next appointment:   In Person  Provider:   Jenne Campus, MD   Other Instructions  Echocardiogram An echocardiogram is a procedure that uses painless sound waves (ultrasound) to produce an image of the heart. Images from an echocardiogram can provide important information about:  Signs of coronary artery disease (CAD).  Aneurysm detection. An aneurysm is a weak or damaged part of an artery wall that bulges out from the normal force of blood pumping through the body.  Heart size and shape. Changes in the size or shape of the heart can be associated with certain conditions, including heart failure, aneurysm, and CAD.  Heart muscle function.  Heart valve function.  Signs of a past heart attack.  Fluid buildup around the heart.  Thickening of the heart muscle.  A tumor or infectious growth around the heart valves. Tell a health care provider about:  Any allergies you have.  All medicines you are taking, including vitamins, herbs, eye drops, creams, and over-the-counter medicines.  Any blood disorders you have.  Any surgeries you have had.  Any medical conditions you have.  Whether you are pregnant or may be pregnant. What are the risks? Generally, this is a safe procedure. However, problems may occur, including:  Allergic  reaction to dye (contrast) that may be used during the procedure. What happens before the procedure? No specific preparation is needed. You may eat and drink normally. What happens during the procedure?   An IV tube may be inserted into one of your veins.  You may receive contrast through this tube. A contrast is an injection that improves the quality of the pictures from your heart.  A gel will be applied to your chest.  A wand-like tool (transducer) will be moved over your chest. The gel will help to transmit the sound waves from the transducer.  The sound waves will harmlessly bounce off of your heart to allow the heart images to be captured in real-time motion. The images will be recorded on a computer. The procedure may vary among health care providers and hospitals. What happens after the procedure?  You may return to your normal, everyday life, including diet, activities, and medicines, unless your health care  provider tells you not to do that. Summary  An echocardiogram is a procedure that uses painless sound waves (ultrasound) to produce an image of the heart.  Images from an echocardiogram can provide important information about the size and shape of your heart, heart muscle function, heart valve function, and fluid buildup around your heart.  You do not need to do anything to prepare before this procedure. You may eat and drink normally.  After the echocardiogram is completed, you may return to your normal, everyday life, unless your health care provider tells you not to do that. This information is not intended to replace advice given to you by your health care provider. Make sure you discuss any questions you have with your health care provider. Document Revised: 03/25/2019 Document Reviewed: 01/04/2017 Elsevier Patient Education  [place].

## 2020-04-06 ENCOUNTER — Other Ambulatory Visit: Payer: Self-pay

## 2020-04-06 ENCOUNTER — Ambulatory Visit (HOSPITAL_BASED_OUTPATIENT_CLINIC_OR_DEPARTMENT_OTHER)
Admission: RE | Admit: 2020-04-06 | Discharge: 2020-04-06 | Disposition: A | Discharge status: Home / Self Care | Payer: Managed Care, Other (non HMO) | Source: Ambulatory Visit | Attending: Cardiology | Admitting: Cardiology

## 2020-04-06 DIAGNOSIS — I119 Hypertensive heart disease without heart failure: Secondary | ICD-10-CM | POA: Diagnosis not present

## 2020-04-06 DIAGNOSIS — R002 Palpitations: Secondary | ICD-10-CM | POA: Insufficient documentation

## 2020-04-06 DIAGNOSIS — R000 Tachycardia, unspecified: Secondary | ICD-10-CM | POA: Diagnosis not present

## 2020-04-06 DIAGNOSIS — I159 Secondary hypertension, unspecified: Secondary | ICD-10-CM | POA: Insufficient documentation

## 2020-04-06 DIAGNOSIS — E785 Hyperlipidemia, unspecified: Secondary | ICD-10-CM | POA: Diagnosis not present

## 2020-04-06 NOTE — Progress Notes (Signed)
  Echocardiogram 2D Echocardiogram has been performed.  Sinda Du 04/06/2020, 3:46 PM

## 2020-04-10 ENCOUNTER — Ambulatory Visit: Payer: Managed Care, Other (non HMO) | Admitting: Family Medicine

## 2020-04-10 ENCOUNTER — Encounter: Payer: Self-pay | Admitting: Skilled Nursing Facility1

## 2020-04-10 ENCOUNTER — Encounter: Payer: Managed Care, Other (non HMO) | Attending: Family Medicine | Admitting: Skilled Nursing Facility1

## 2020-04-10 ENCOUNTER — Encounter: Payer: Self-pay | Admitting: Family Medicine

## 2020-04-10 ENCOUNTER — Other Ambulatory Visit: Payer: Self-pay

## 2020-04-10 DIAGNOSIS — I10 Essential (primary) hypertension: Secondary | ICD-10-CM | POA: Diagnosis not present

## 2020-04-10 NOTE — Patient Instructions (Signed)
-  Limit mayo to serving size  -Limit cheese to 3 servings a day (3 ounces)  -Limit ranch to serving size   -Aim to eat 3 meals a day   -Fish is a great lean protein  -For your baked potato Look for a small one or eat half without cheese and try plain no fat greek yogurt instead of sour cream  -for breakfast fruit smoothie with 2 eggs or 1 cup grits and eggs  -Try brown rice more often than white  -Serving of rice=1/3 cup and mac n cheese 1/2 cup  -2 servings of nuts a day is fine (1/4 cup)  -Limit butter and sugar in cabbage   -Cut out nestle quick   -Avoid sweet tea

## 2020-04-10 NOTE — Progress Notes (Signed)
   Assessment:  Primary concerns today: obesity.   Pt states he does not wear his C-PAP because it is uncomfortable.  Pt states he wants to lose weight. Pt states his weight goes from 350-320 in the last 15 years. Pt states he has tried to lose wight before such as stop drinking alcohol, gave up pizza and bread, stopped eating beef and stopped eating fried foods. Pt states he makes his own meals. 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 Pt states when he eats throughout the day he gets really tired so he avoids eating. Pt states when he does not eat enough he will get shaky at night.    MEDICATIONS: see list   DIETARY INTAKE:  Usual eating pattern includes 1 meals and 1 snacks per day.  Everyday foods include none stated.  Avoided foods include none stated.    24-hr recall:  B ( AM):  Snk ( AM):  L ( PM): maybe popcorn Snk ( PM):  D ( 6:30 PM): 4 scrambled eggs broccoli onion with cheese and sour cream  Snk ( PM): ice cream Beverages: gatorade zero, water, 2 nestle quick  Usual physical activity: ADL's     Intervention:  Nutrition counseling. Dietitian educated pt on balanced meals within the context of HTN and weight management.   Goals: Limit mayo to serving size -Limit cheese to 3 servings a day (3 ounces) -Limit ranch to serving size  -Aim to eat 3 meals a day  -Fish is a great lean protein -For your baked potato Look for a small one or eat half without cheese and try plain no fat greek yogurt instead of sour cream -for breakfast fruit smoothie with 2 eggs or 1 cup grits and eggs -Try brown rice more often than white -Serving of rice=1/3 cup and mac n cheese 1/2 cup -2 servings of nuts a day is fine (1/4 cup) -Limit butter and sugar in cabbage  -Cut out nestle quick  -Avoid sweet tea  Teaching Method Utilized: Visual Auditory Hands on  Handouts given during visit include:  Detailed MyPlate  Barriers to learning/adherence to lifestyle change:  none identified   Demonstrated degree of understanding via:  Teach Back   Monitoring/Evaluation:  Dietary intake, exercise, and body weight prn.

## 2020-04-14 ENCOUNTER — Other Ambulatory Visit: Payer: Self-pay | Admitting: Physician Assistant

## 2020-04-14 ENCOUNTER — Other Ambulatory Visit: Payer: Self-pay | Admitting: Family Medicine

## 2020-04-17 ENCOUNTER — Other Ambulatory Visit: Payer: Self-pay

## 2020-04-17 ENCOUNTER — Encounter: Payer: Self-pay | Admitting: Family Medicine

## 2020-04-17 ENCOUNTER — Other Ambulatory Visit: Payer: Self-pay | Admitting: Family Medicine

## 2020-04-17 DIAGNOSIS — E349 Endocrine disorder, unspecified: Secondary | ICD-10-CM

## 2020-04-17 MED ORDER — DOCUSATE SODIUM 100 MG PO CAPS: 100.0000 mg | ORAL_CAPSULE | Freq: Two times a day (BID) | ORAL | 3 refills | Status: DC | PRN

## 2020-04-17 NOTE — Telephone Encounter (Signed)
Last written by Dr Denyse Amass in 2019, not on current med list   Please advise if appropriate

## 2020-04-21 ENCOUNTER — Encounter: Payer: Self-pay | Admitting: Family Medicine

## 2020-04-21 ENCOUNTER — Ambulatory Visit (INDEPENDENT_AMBULATORY_CARE_PROVIDER_SITE_OTHER): Payer: Managed Care, Other (non HMO) | Admitting: Family Medicine

## 2020-04-21 ENCOUNTER — Other Ambulatory Visit: Payer: Self-pay

## 2020-04-21 MED ORDER — CONTRAVE 8-90 MG PO TB12
ORAL_TABLET | ORAL | 0 refills | Status: DC
Start: 2020-04-21 — End: 2020-06-02

## 2020-04-21 NOTE — Progress Notes (Signed)
Dylan Munoz. - 44 y.o. male MRN [id number]  Date of birth: Nov 27, 1976  Subjective Chief Complaint  Patient presents with  . Medication Refill    HPI Dylan Munoz. is a 44 y.o. male here today to discuss weight management.  He has history of HTN, pancreatitis, OSA, hypogonadism and HLD.  He has met with nutritionist and has started making changes to diet and increasing activity.  His BP is better controlled and his weight is down about 7 lbs over the past month.  He is interested in adding medication to help with weight management.   ROS:  A comprehensive ROS was completed and negative except as noted per HPI  Allergies  Allergen Reactions  . Amlodipine Other (See Comments)    Constipation    Past Medical History:  Diagnosis Date  . High cholesterol   . Hypertension   . Morbid obesity ([place]) 04/17/2016  . Severe obstructive sleep apnea 04/17/2016   AHI 14.9 with desats <88% for >90 mins on SNAP report 04/02/18. See scanned document.  . Testosterone deficiency 04/19/2016  . Vitamin D deficiency 04/19/2016    Past Surgical History:  Procedure Laterality Date  . KNEE ARTHROSCOPY W/ ACL RECONSTRUCTION      Social History   Socioeconomic History  . Marital status: Single    Spouse name: Not on file  . Number of children: Not on file  . Years of education: Not on file  . Highest education level: Not on file  Occupational History  . Not on file  Tobacco Use  . Smoking status: Never Smoker  . Smokeless tobacco: Current User    Types: Chew  Substance and Sexual Activity  . Alcohol use: Yes    Alcohol/week: 0.0 standard drinks    Comment: weekly  . Drug use: No  . Sexual activity: Yes  Other Topics Concern  . Not on file  Social History Narrative  . Not on file   Social Determinants of Health   Financial Resource Strain:   . Difficulty of Paying Living Expenses:   Food Insecurity:   . Worried About Charity fundraiser in the Last Year:   . Arboriculturist in  the Last Year:   Transportation Needs:   . Film/video editor (Medical):   Marland Kitchen Lack of Transportation (Non-Medical):   Physical Activity:   . Days of Exercise per Week:   . Minutes of Exercise per Session:   Stress:   . Feeling of Stress :   Social Connections:   . Frequency of Communication with Friends and Family:   . Frequency of Social Gatherings with Friends and Family:   . Attends Religious Services:   . Active Member of Clubs or Organizations:   . Attends Archivist Meetings:   Marland Kitchen Marital Status:     Family History  Problem Relation Age of Onset  . Heart disease Father   . Diabetes Paternal Uncle   . Colon cancer Neg Hx   . Esophageal cancer Neg Hx     Health Maintenance  Topic Date Due  . COVID-19 Vaccine (1) Never done  . HIV Screening  02/24/2021 (Originally 04/03/1991)  . INFLUENZA VACCINE  07/16/2020  . TETANUS/TDAP  04/17/2026     ----------------------------------------------------------------------------------------------------------------------------------------------------------------------------------------------------------------- Physical Exam BP 107/76 (BP Location: Left Arm, Patient Position: Sitting, Cuff Size: Large)   Pulse 93   Ht 6' 0.05" (1.83 m)   Wt (!) 302 lb 3.2 oz (137.1 kg)   SpO2 100%  BMI 40.93 kg/m   Physical Exam Constitutional:      Appearance: Normal appearance.  HENT:     Head: Normocephalic and atraumatic.     Mouth/Throat:     Mouth: Mucous membranes are moist.  Eyes:     General: No scleral icterus. Cardiovascular:     Rate and Rhythm: Normal rate and regular rhythm.  Pulmonary:     Effort: Pulmonary effort is normal.     Breath sounds: Normal breath sounds.  Musculoskeletal:     Cervical back: Neck supple.  Neurological:     General: No focal deficit present.     Mental Status: He is alert.  Psychiatric:        Mood and Affect: Mood normal.        Behavior: Behavior normal.      ------------------------------------------------------------------------------------------------------------------------------------------------------------------------------------------------------------------- Assessment and Plan  Morbid obesity ([employer]) -Discussed with him that I think the safest medication for him to try would be Contrave.  With history of HTN I don't think phentermine combinations would be a good option for him but something we may revisit in the future if not having adequate response to contrave and BP remains well controlled.  -Recommend avoidance of EtOH while taking contrave.  -Discussed with him that with history of pancreatitis I don't think saxenda would be appropriate for him.  -He will continue working on diet and exercise lifestyle change.  -I will plan to see him back in about 1 month.     Meds ordered this encounter  Medications  . Naltrexone-buPROPion HCl ER (CONTRAVE) 8-90 MG TB12    Sig: Start 1 tablet every morning for 7 days, then 1 tablet twice daily for 7 days, then 2 tablets every morning and one every evening    Dispense:  120 tablet    Refill:  0    Return in about 5 weeks (around 05/26/2020) for weight management. .    This visit occurred during the SARS-CoV-2 public health emergency.  Safety protocols were in place, including screening questions prior to the visit, additional usage of staff PPE, and extensive cleaning of exam room while observing appropriate contact time as indicated for disinfecting solutions.

## 2020-04-21 NOTE — Assessment & Plan Note (Addendum)
-  Discussed with him that I think the safest medication for him to try would be Contrave.  With history of HTN I don't think phentermine combinations would be a good option for him but something we may revisit in the future if not having adequate response to contrave and BP remains well controlled.  -Recommend avoidance of EtOH while taking contrave.  -Discussed with him that with history of pancreatitis I don't think saxenda would be appropriate for him.  -He will continue working on diet and exercise lifestyle change.  -I will plan to see him back in about 1 month.

## 2020-04-21 NOTE — Patient Instructions (Signed)

## 2020-04-22 ENCOUNTER — Encounter: Payer: Self-pay | Admitting: Family Medicine

## 2020-04-24 NOTE — Telephone Encounter (Signed)
Can you look into Contrave to see if PA is needed?

## 2020-04-24 NOTE — Telephone Encounter (Signed)
I sent Prior auth through cover my meds and received message that patient would have to pay 100% of a discounted price for this medication and it will not apply to the deductible or out of pocket. I sent a mychart message to patient to inform him as well. - CF

## 2020-04-25 ENCOUNTER — Encounter: Payer: Self-pay | Admitting: Family Medicine

## 2020-05-01 ENCOUNTER — Encounter: Payer: Self-pay | Admitting: Family Medicine

## 2020-05-01 ENCOUNTER — Other Ambulatory Visit: Payer: Self-pay | Admitting: Osteopathic Medicine

## 2020-05-02 NOTE — Telephone Encounter (Signed)
Dr. Ashley Royalty,    I have explained to Mr. Drumwright that his insurance does not cover contrave he will have to pay a discounted price at 100% I already did the prior authorization so I am not sure why he is wanting me to call insurance again. Please discuss this with him and also please address oxymoron.    Thank you Arline Asp

## 2020-05-09 ENCOUNTER — Ambulatory Visit: Payer: Managed Care, Other (non HMO) | Admitting: Skilled Nursing Facility1

## 2020-05-11 ENCOUNTER — Other Ambulatory Visit: Payer: Self-pay

## 2020-05-16 ENCOUNTER — Encounter: Payer: Self-pay | Admitting: Emergency Medicine

## 2020-05-18 ENCOUNTER — Ambulatory Visit: Payer: Managed Care, Other (non HMO) | Admitting: Cardiology

## 2020-05-19 ENCOUNTER — Ambulatory Visit: Payer: Managed Care, Other (non HMO) | Admitting: Family Medicine

## 2020-05-23 ENCOUNTER — Telehealth: Payer: Self-pay | Admitting: Cardiology

## 2020-05-23 NOTE — Telephone Encounter (Signed)
Patient states he is returning a call to discuss results, however he does not know what type of results or who may have called him. No current notes or results available. Please call.

## 2020-05-24 NOTE — Telephone Encounter (Signed)
Called patient. Informed him that there are now recent results I let him know that the last result from [name] was his echocardiogram and the results were normal, sent to [facility] and a voicemail left. He verbally understood no further questions. I advised him to call back if he had any other questions.

## 2020-05-26 ENCOUNTER — Ambulatory Visit: Payer: Managed Care, Other (non HMO) | Admitting: Family Medicine

## 2020-06-02 ENCOUNTER — Other Ambulatory Visit: Payer: Self-pay

## 2020-06-02 ENCOUNTER — Encounter: Payer: Self-pay | Admitting: Cardiology

## 2020-06-02 ENCOUNTER — Other Ambulatory Visit: Payer: Self-pay | Admitting: Physician Assistant

## 2020-06-02 ENCOUNTER — Ambulatory Visit (INDEPENDENT_AMBULATORY_CARE_PROVIDER_SITE_OTHER): Payer: Managed Care, Other (non HMO) | Admitting: Cardiology

## 2020-06-02 VITALS — BP 112/80 | HR 100 | Ht 72.0 in | Wt 316.0 lb

## 2020-06-02 DIAGNOSIS — R002 Palpitations: Secondary | ICD-10-CM | POA: Diagnosis not present

## 2020-06-02 DIAGNOSIS — F1010 Alcohol abuse, uncomplicated: Secondary | ICD-10-CM | POA: Diagnosis not present

## 2020-06-02 DIAGNOSIS — E782 Mixed hyperlipidemia: Secondary | ICD-10-CM | POA: Diagnosis not present

## 2020-06-02 NOTE — Progress Notes (Signed)
Cardiology Office Note:    Date:  06/02/2020   ID:  Dylan Cullens., DOB [birth year], MRN [id number]  PCP:  Everrett Coombe, DO  Cardiologist:  Gypsy Balsam, MD    Referring MD: Everrett Coombe, DO   No chief complaint on file. I am doing fine  History of Present Illness:    Dylan Wimmer. is a 44 y.o. male who was referred to [name] because of persistent sinus tachycardia.  What prompted his referral was divided he was in the hospital because of acute pancreatitis.  Likely did not require surgery it was probably related to alcohol abuse.  He apparently stopped drinking he did have echocardiogram done which showed preserved normal left ventricle ejection fraction, he also wearing Zio patch which showed persistent sinus tachycardia with average heart rate about 100.  He is starting going back to gym he can go on the bike and stay on the bike for about 1 hour he also goes on the treadmill have no difficulty doing it.  There is no palpitation chest pain tightness squeezing pressure burning chest.  Overall he seems to be doing well.  Past Medical History:  Diagnosis Date  . High cholesterol   . Hypertension   . Morbid obesity (HCC) 04/17/2016  . Severe obstructive sleep apnea 04/17/2016   AHI 14.9 with desats <88% for >90 mins on SNAP report 04/02/18. See scanned document.  . Testosterone deficiency 04/19/2016  . Vitamin D deficiency 04/19/2016    Past Surgical History:  Procedure Laterality Date  . KNEE ARTHROSCOPY W/ ACL RECONSTRUCTION      Current Medications: Current Meds  Medication Sig  . Alcohol Swabs (ALCOHOL PREP) PADS Use to clean skin and vials for testosterone injections.  . AMBULATORY NON FORMULARY MEDICATION Please set CPAP to auto-titration from 10-20.  \  Adjust mask fit.  Send 30 day compliance report 30 days after change is made.   Send to [employer]  . atorvastatin (LIPITOR) 80 MG tablet Take 1 tablet (80 mg total) by mouth daily.  . cyclobenzaprine (FLEXERIL) 10 MG  tablet TAKE 1 TABLET BY MOUTH TWICE A DAY AS NEEDED FOR MUSCLE SPASM  . lisinopril (ZESTRIL) 20 MG tablet Take 1 tablet by mouth.  . metoprolol tartrate (LOPRESSOR) 25 MG tablet Take by mouth.  Marland Kitchen NEEDLE, DISP, 18 G (B-D BLUNT FILL NEEDLE) 18G X 1-1/2" MISC Use to draw up testosterone weekly  . omeprazole (PRILOSEC) 20 MG capsule Take 20 mg by mouth 2 (two) times daily.  . SYRINGE-NEEDLE, DISP, 3 ML (B-D 3CC LUER-LOK SYR 22GX1-1/2) 22G X 1-1/2" 3 ML MISC USE TO ADMINISTER TESTOSTERONE WEEKLY  . testosterone cypionate (DEPOTESTOSTERONE CYPIONATE) 200 MG/ML injection INJECT 0.5 MLS (100 MG TOTAL) INTO THE MUSCLE ONCE A WEEK.     Allergies:   Amlodipine   Social History   Socioeconomic History  . Marital status: Single    Spouse name: Not on file  . Number of children: Not on file  . Years of education: Not on file  . Highest education level: Not on file  Occupational History  . Not on file  Tobacco Use  . Smoking status: Never Smoker  . Smokeless tobacco: Current User    Types: Chew  Vaping Use  . Vaping Use: Never used  Substance and Sexual Activity  . Alcohol use: Yes    Alcohol/week: 0.0 standard drinks    Comment: weekly  . Drug use: No  . Sexual activity: Yes  Other Topics Concern  . Not  on file  Social History Narrative  . Not on file   Social Determinants of Health   Financial Resource Strain:   . Difficulty of Paying Living Expenses:   Food Insecurity:   . Worried About Programme researcher, broadcasting/film/video in the Last Year:   . Barista in the Last Year:   Transportation Needs:   . Freight forwarder (Medical):   Marland Kitchen Lack of Transportation (Non-Medical):   Physical Activity:   . Days of Exercise per Week:   . Minutes of Exercise per Session:   Stress:   . Feeling of Stress :   Social Connections:   . Frequency of Communication with Friends and Family:   . Frequency of Social Gatherings with Friends and Family:   . Attends Religious Services:   . Active Member of  Clubs or Organizations:   . Attends Banker Meetings:   Marland Kitchen Marital Status:      Family History: The patient's family history includes Diabetes in his paternal uncle; Heart disease in his father. There is no history of Colon cancer or Esophageal cancer. ROS:   Please see the history of present illness.    All 14 point review of systems negative except as described per history of present illness  EKGs/Labs/Other Studies Reviewed:      Recent Labs: 03/31/2020: ALT 41; BUN 11; Creat 0.97; Hemoglobin 14.9; Platelets 592; Potassium 4.8; Sodium 138  Recent Lipid Panel    Component Value Date/Time   CHOL 123 02/18/2020 1154   TRIG 144 02/18/2020 1154   HDL 54 02/18/2020 1154   CHOLHDL 2.3 02/18/2020 1154   VLDL 37 (H) 04/17/2016 0836   LDLCALC 46 02/18/2020 1154    Physical Exam:    VS:  BP 112/80   Pulse 100   Ht 6' (1.829 m)   Wt (!) 316 lb (143.3 kg)   SpO2 98%   BMI 42.86 kg/m     Wt Readings from Last 3 Encounters:  06/02/20 (!) 316 lb (143.3 kg)  04/21/20 (!) 302 lb 3.2 oz (137.1 kg)  04/10/20 (!) 303 lb 3.2 oz (137.5 kg)     GEN:  Well nourished, well developed in no acute distress HEENT: Normal NECK: No JVD; No carotid bruits LYMPHATICS: No lymphadenopathy CARDIAC: RRR, no murmurs, no rubs, no gallops RESPIRATORY:  Clear to auscultation without rales, wheezing or rhonchi  ABDOMEN: Soft, non-tender, non-distended MUSCULOSKELETAL:  No edema; No deformity  SKIN: Warm and dry LOWER EXTREMITIES: no swelling NEUROLOGIC:  Alert and oriented x 3 PSYCHIATRIC:  Normal affect   ASSESSMENT:    1. Palpitations   2. Mixed hyperlipidemia   3. Alcohol abuse    PLAN:    In order of problems listed above:  1. Palpitations: Denies having any.  Zio patch reviewed with him.  There was no worrisome arrhythmia.  He does have persistent sinus tachycardia.  He used to be on beta-blocker however he stopped it I told him that considering the fact that there is no  significant cardiomyopathy as well as the fact that overall he is doing well I will not put him on any medication just simply watch the situation.  We will talk again in about 3 month and see how his heart rate does affect his exercise on the regular basis. 2. Mixed dyslipidemia: 3. I see him we will get his fasting lipid profile.  I do have lab work test from March of this year this is from [facility]  showing LDL of 46 and HDL 34.  He is on Lipitor of 80 which again I will continue for now but will recheck he is fasting lipid profileWhen I see him 4. Alcohol abuse: He denies having any.   Medication Adjustments/Labs and Tests Ordered: Current medicines are reviewed at length with the patient today.  Concerns regarding medicines are outlined above.  No orders of the defined types were placed in this encounter.  Medication changes: No orders of the defined types were placed in this encounter.   Signed, Park Liter, MD, [facility] 06/02/2020 2:29 PM    [place]

## 2020-06-02 NOTE — Patient Instructions (Signed)
Medication Instructions:  .isntcu  *If you need a refill on your cardiac medications before your next appointment, please call your pharmacy*   Lab Work: None. If you have labs (blood work) drawn today and your tests are completely normal, you will receive your results only by: . MyChart Message (if you have MyChart) OR . A paper copy in the mail If you have any lab test that is abnormal or we need to change your treatment, we will call you to review the results.   Testing/Procedures: None   Follow-Up: At [facility], you and your health needs are our priority.  As part of our continuing mission to provide you with exceptional heart care, we have created designated Provider Care Teams.  These Care Teams include your primary Cardiologist (physician) and Advanced Practice Providers (APPs -  Physician Assistants and Nurse Practitioners) who all work together to provide you with the care you need, when you need it.  We recommend signing up for the patient portal called "MyChart".  Sign up information is provided on this After Visit Summary.  MyChart is used to connect with patients for Virtual Visits (Telemedicine).  Patients are able to view lab/test results, encounter notes, upcoming appointments, etc.  Non-urgent messages can be sent to your provider as well.   To learn more about what you can do with MyChart, go to [email or web address].    Your next appointment:   3 month(s)  The format for your next appointment:   In Person  Provider:   Robert Krasowski, MD   Other Instructions   

## 2020-06-09 ENCOUNTER — Encounter: Payer: Self-pay | Admitting: Family Medicine

## 2020-06-09 NOTE — Telephone Encounter (Signed)
Patient requesting medication refill for Tramadol 50 mg. Patient is aware PCP is out of office this afternoon and will return on Monday.  Last OV- 04/21/20, Last Refill of tramadol 04/17/20. Patient has had 2 recent appointments scheduled on 05/26/20, and 06/04/20121 which he was a no show for both appointments. Please advise

## 2020-06-15 ENCOUNTER — Ambulatory Visit: Payer: Managed Care, Other (non HMO) | Admitting: Skilled Nursing Facility1

## 2020-07-03 ENCOUNTER — Other Ambulatory Visit: Payer: Self-pay | Admitting: Osteopathic Medicine

## 2020-07-21 ENCOUNTER — Ambulatory Visit: Payer: Managed Care, Other (non HMO) | Admitting: Family Medicine

## 2020-08-03 ENCOUNTER — Encounter: Payer: Managed Care, Other (non HMO) | Attending: Family Medicine | Admitting: Skilled Nursing Facility1

## 2020-08-03 ENCOUNTER — Other Ambulatory Visit: Payer: Self-pay

## 2020-08-03 DIAGNOSIS — E669 Obesity, unspecified: Secondary | ICD-10-CM | POA: Diagnosis present

## 2020-08-03 NOTE — Progress Notes (Signed)
   Assessment:  Primary concerns today: obesity.    Pt states he has been drinking protein shakes and going to the gym. Pt states he beleives his weight gain to be muscle mass. Pt states he really has not been watching what he eats due to losing his best friened from Covid. Dietitian encouraged the pt to congratulate himself for the changes he was able to make even with losing his best friend and moving through a pandemic.   Body Composition Scale 08/03/2020  Current Body Weight 328  Total Body Fat % 37.6  Visceral Fat 31  Fat-Free Mass % 62.3   Total Body Water % 43.3  Muscle-Mass lbs 61.9  BMI 44.6  Body Fat Displacement          Torso  lbs 76.7         Left Leg  lbs 15.3         Right Leg  lbs 15.3         Left Arm  lbs 7.6         Right Arm   lbs 7.6    MEDICATIONS: see list   DIETARY INTAKE:  Usual eating pattern includes 1 meals and 1 snacks per day.  Everyday foods include none stated.  Avoided foods include none stated.    24-hr recall:  B ( AM): half protein shake: fruit + creatinine + protein powder  Snk ( AM):  L ( PM): half protein shake: fruit + creatinine + protein powder Snk ( PM):  D ( 6:30 PM): 1 hamburger + tator tots Snk ( PM): ice cream Beverages: gatorade zero, gallon water, soda  Usual physical activity: thursday thru Sunday: 6-7:30; Saturday 9-11; weight machine and bike since about 4 months     Intervention:  Nutrition counseling. Dietitian educated pt on balanced meals within the context of HTN and weight management.   Teaching Method Utilized: Visual Auditory Hands on  Handouts given during visit include:  Detailed MyPlate  Barriers to learning/adherence to lifestyle change: none identified   Demonstrated degree of understanding via:  Teach Back   Monitoring/Evaluation:  Dietary intake, exercise, and body weight prn.

## 2020-08-18 ENCOUNTER — Ambulatory Visit: Payer: Managed Care, Other (non HMO) | Admitting: Cardiology

## 2020-09-09 ENCOUNTER — Encounter: Payer: Self-pay | Admitting: Family Medicine

## 2020-09-09 DIAGNOSIS — E349 Endocrine disorder, unspecified: Secondary | ICD-10-CM

## 2020-09-11 NOTE — Telephone Encounter (Signed)
Rx's pended. 

## 2020-09-12 ENCOUNTER — Encounter: Payer: Self-pay | Admitting: Family Medicine

## 2020-09-12 MED ORDER — TESTOSTERONE CYPIONATE 200 MG/ML IM SOLN: 100.0000 mg | INTRAMUSCULAR | 2 refills | Status: DC

## 2020-09-12 MED ORDER — LISINOPRIL 20 MG PO TABS: 20.0000 mg | ORAL_TABLET | Freq: Every day | ORAL | 0 refills | Status: DC

## 2020-09-22 ENCOUNTER — Encounter: Payer: Self-pay | Admitting: Family Medicine

## 2020-09-22 ENCOUNTER — Ambulatory Visit (INDEPENDENT_AMBULATORY_CARE_PROVIDER_SITE_OTHER): Payer: Managed Care, Other (non HMO) | Admitting: Family Medicine

## 2020-09-22 ENCOUNTER — Other Ambulatory Visit: Payer: Self-pay

## 2020-09-22 VITALS — BP 158/93 | HR 104 | Temp 98.0°F | Wt 324.7 lb

## 2020-09-22 DIAGNOSIS — E782 Mixed hyperlipidemia: Secondary | ICD-10-CM | POA: Diagnosis not present

## 2020-09-22 DIAGNOSIS — Z23 Encounter for immunization: Secondary | ICD-10-CM

## 2020-09-22 DIAGNOSIS — I10 Essential (primary) hypertension: Secondary | ICD-10-CM | POA: Diagnosis not present

## 2020-09-22 MED ORDER — SEMAGLUTIDE-WEIGHT MANAGEMENT 1 MG/0.5ML ~~LOC~~ SOAJ: 1.0000 mg | SUBCUTANEOUS | 0 refills | Status: DC

## 2020-09-22 MED ORDER — LOSARTAN POTASSIUM 50 MG PO TABS: 50.0000 mg | ORAL_TABLET | Freq: Every day | ORAL | 3 refills | Status: DC

## 2020-09-22 MED ORDER — SEMAGLUTIDE-WEIGHT MANAGEMENT 2.4 MG/0.75ML ~~LOC~~ SOAJ: 2.4000 mg | SUBCUTANEOUS | 1 refills | Status: DC

## 2020-09-22 MED ORDER — SEMAGLUTIDE-WEIGHT MANAGEMENT 0.25 MG/0.5ML ~~LOC~~ SOAJ: 0.2500 mg | SUBCUTANEOUS | 0 refills | Status: AC

## 2020-09-22 MED ORDER — SEMAGLUTIDE-WEIGHT MANAGEMENT 0.5 MG/0.5ML ~~LOC~~ SOAJ: 0.5000 mg | SUBCUTANEOUS | 0 refills | Status: DC

## 2020-09-22 MED ORDER — SEMAGLUTIDE-WEIGHT MANAGEMENT 1.7 MG/0.75ML ~~LOC~~ SOAJ: 1.7000 mg | SUBCUTANEOUS | 0 refills | Status: DC

## 2020-09-22 NOTE — Patient Instructions (Addendum)
Lisinopril may be causing your cough. Let's try changing to losartan.  See me again in 6-8 weeks.  We'll check labs at this visit.   Let's try [facility] for weight management.  You can get a coupon here:  [email or web address]

## 2020-09-24 ENCOUNTER — Encounter: Payer: Self-pay | Admitting: Family Medicine

## 2020-09-24 NOTE — Assessment & Plan Note (Signed)
Lab Results  Component Value Date   LDLCALC 46 02/18/2020  He is tolerating atorvastatin well.

## 2020-09-24 NOTE — Assessment & Plan Note (Signed)
BP elevated initially, improved on recheck.  Has had chronic cough which may be due to lisinopril.  Will change to losartan at 50mg  Recommend low sodium diet.

## 2020-09-24 NOTE — Progress Notes (Signed)
Dylan Munoz. - 44 y.o. male MRN [id number]  Date of birth: 10-07-1976  Subjective Chief Complaint  Patient presents with  . Hypertension    HPI Dylan Munoz. is a 44 y.o. male here today for follow up visit.  He has a history of HTN and HLD.  He would also like to discuss weight management.   HTN managed with lisinopril 20mg  daily.  His blood pressure has been pretty well controlled at current strength.  He has had persistent dry cough for a few months.  Denies shortness of breath or wheezing.  HE did take lisinopril/hctz previously however had AKI with this.   He continues to tolerate atorvastatin well for HLD.  He denies myalgias or GI symptoms.   He continues to struggle with weight loss.  Weight tends to go up and down but has been consistently increasing at the past couple of visits.  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  He reports that he has been avoiding EtOH intake.    ROS:  A comprehensive ROS was completed and negative except as noted per HPI  Allergies  Allergen Reactions  . Amlodipine Other (See Comments)    Constipation    Past Medical History:  Diagnosis Date  . High cholesterol   . Hypertension   . Morbid obesity (HCC) 04/17/2016  . Severe obstructive sleep apnea 04/17/2016   AHI 14.9 with desats <88% for >90 mins on SNAP report 04/02/18. See scanned document.  . Testosterone deficiency 04/19/2016  . Vitamin D deficiency 04/19/2016    Past Surgical History:  Procedure Laterality Date  . KNEE ARTHROSCOPY W/ ACL RECONSTRUCTION      Social History   Socioeconomic History  . Marital status: Single    Spouse name: Not on file  . Number of children: Not on file  . Years of education: Not on file  . Highest education level: Not on file  Occupational History  . Not on file  Tobacco Use  . Smoking status: Never Smoker  . Smokeless tobacco: Current User    Types: Chew  Vaping Use  . Vaping Use: Never used  Substance and Sexual  Activity  . Alcohol use: Yes    Alcohol/week: 0.0 standard drinks    Comment: weekly  . Drug use: No  . Sexual activity: Yes  Other Topics Concern  . Not on file  Social History Narrative  . Not on file   Social Determinants of Health   Financial Resource Strain:   . Difficulty of Paying Living Expenses: Not on file  Food Insecurity:   . Worried About 06/19/2016 in the Last Year: Not on file  . Ran Out of Food in the Last Year: Not on file  Transportation Needs:   . Lack of Transportation (Medical): Not on file  . Lack of Transportation (Non-Medical): Not on file  Physical Activity:   . Days of Exercise per Week: Not on file  . Minutes of Exercise per Session: Not on file  Stress:   . Feeling of Stress : Not on file  Social Connections:   . Frequency of Communication with Friends and Family: Not on file  . Frequency of Social Gatherings with Friends and Family: Not on file  . Attends Religious Services: Not on file  . Active Member of Clubs or Organizations: Not on file  . Attends Programme researcher, broadcasting/film/video Meetings: Not on file  . Marital Status: Not on file    Family History  Problem Relation Age of Onset  . Heart disease Father   . Diabetes Paternal Uncle   . Colon cancer Neg Hx   . Esophageal cancer Neg Hx     Health Maintenance  Topic Date Due  . Hepatitis C Screening  Never done  . COVID-19 Vaccine (1) Never done  . INFLUENZA VACCINE  07/16/2020  . HIV Screening  02/24/2021 (Originally 04/03/1991)  . TETANUS/TDAP  04/17/2026     ----------------------------------------------------------------------------------------------------------------------------------------------------------------------------------------------------------------- Physical Exam BP (!) 158/93 (BP Location: Left Arm, Patient Position: Sitting, Cuff Size: Large)   Pulse (!) 104   Temp 98 F (36.7 C)   Wt (!) 324 lb 11.2 oz (147.3 kg)   SpO2 100%   BMI 44.04 kg/m   Physical  Exam Constitutional:      Appearance: Normal appearance.  Eyes:     General: No scleral icterus. Cardiovascular:     Rate and Rhythm: Normal rate and regular rhythm.  Pulmonary:     Effort: Pulmonary effort is normal.     Breath sounds: Normal breath sounds.  Neurological:     General: No focal deficit present.     Mental Status: He is alert.  Psychiatric:        Mood and Affect: Mood normal.        Behavior: Behavior normal.     ------------------------------------------------------------------------------------------------------------------------------------------------------------------------------------------------------------------- Assessment and Plan  HTN (hypertension) BP elevated initially, improved on recheck.  Has had chronic cough which may be due to lisinopril.  Will change to losartan at 50mg  Recommend low sodium diet.   Hyperlipidemia Lab Results  Component Value Date   LDLCALC 46 02/18/2020  He is tolerating atorvastatin well.   Morbid obesity (HCC) Contrave not approved previously.  Discussed trying wegovy.  He does have a history of pancreatitis and we discussed potential of recurrent pancreatitis with this medication.  He can decreased this risk with avoiding EtOH .   He will monitor for for signs and/or symptoms.   Instructed to really work on dietary changes and increased exercise.     Meds ordered this encounter  Medications  . losartan (COZAAR) 50 MG tablet    Sig: Take 1 tablet (50 mg total) by mouth daily.    Dispense:  90 tablet    Refill:  3  . Semaglutide-Weight Management 0.25 MG/0.5ML SOAJ    Sig: Inject 0.25 mg into the skin once a week for 28 days.    Dispense:  2 mL    Refill:  0  . Semaglutide-Weight Management 0.5 MG/0.5ML SOAJ    Sig: Inject 0.5 mg into the skin once a week for 28 days.    Dispense:  2 mL    Refill:  0  . Semaglutide-Weight Management 1 MG/0.5ML SOAJ    Sig: Inject 1 mg into the skin once a week for 28 days.     Dispense:  2 mL    Refill:  0  . Semaglutide-Weight Management 1.7 MG/0.75ML SOAJ    Sig: Inject 1.7 mg into the skin once a week for 28 days.    Dispense:  3 mL    Refill:  0  . Semaglutide-Weight Management 2.4 MG/0.75ML SOAJ    Sig: Inject 2.4 mg into the skin once a week for 28 days.    Dispense:  3 mL    Refill:  1    Return in about 8 weeks (around 11/17/2020).    This visit occurred during the SARS-CoV-2 public health emergency.  Safety protocols were in place,  including screening questions prior to the visit, additional usage of staff PPE, and extensive cleaning of exam room while observing appropriate contact time as indicated for disinfecting solutions.

## 2020-09-24 NOTE — Assessment & Plan Note (Signed)
Contrave not approved previously.  Discussed trying wegovy.  He does have a history of pancreatitis and we discussed potential of recurrent pancreatitis with this medication.  He can decreased this risk with avoiding EtOH .   He will monitor for for signs and/or symptoms.   Instructed to really work on dietary changes and increased exercise.

## 2020-09-25 ENCOUNTER — Other Ambulatory Visit: Payer: Self-pay

## 2020-09-25 MED ORDER — SEMAGLUTIDE-WEIGHT MANAGEMENT 1.7 MG/0.75ML ~~LOC~~ SOAJ: 1.7000 mg | SUBCUTANEOUS | 0 refills | Status: AC

## 2020-09-25 MED ORDER — SEMAGLUTIDE-WEIGHT MANAGEMENT 2.4 MG/0.75ML ~~LOC~~ SOAJ: 2.4000 mg | SUBCUTANEOUS | 1 refills | Status: AC

## 2020-09-25 MED ORDER — SEMAGLUTIDE-WEIGHT MANAGEMENT 1 MG/0.5ML ~~LOC~~ SOAJ: 1.0000 mg | SUBCUTANEOUS | 0 refills | Status: AC

## 2020-09-25 MED ORDER — SEMAGLUTIDE-WEIGHT MANAGEMENT 0.5 MG/0.5ML ~~LOC~~ SOAJ: 0.5000 mg | SUBCUTANEOUS | 0 refills | Status: DC

## 2020-09-25 NOTE — Progress Notes (Signed)
My apologies.  Flu vaccine added.

## 2020-10-20 ENCOUNTER — Other Ambulatory Visit: Payer: Self-pay

## 2020-10-20 ENCOUNTER — Encounter: Payer: Self-pay | Admitting: Family Medicine

## 2020-10-20 MED ORDER — SEMAGLUTIDE-WEIGHT MANAGEMENT 0.5 MG/0.5ML ~~LOC~~ SOAJ: 0.5000 mg | SUBCUTANEOUS | 0 refills | Status: DC

## 2020-11-17 ENCOUNTER — Other Ambulatory Visit: Payer: Self-pay

## 2020-11-17 ENCOUNTER — Encounter: Payer: Self-pay | Admitting: Family Medicine

## 2020-11-17 ENCOUNTER — Ambulatory Visit (INDEPENDENT_AMBULATORY_CARE_PROVIDER_SITE_OTHER): Payer: Managed Care, Other (non HMO) | Admitting: Family Medicine

## 2020-11-17 DIAGNOSIS — F1010 Alcohol abuse, uncomplicated: Secondary | ICD-10-CM | POA: Diagnosis not present

## 2020-11-17 DIAGNOSIS — E782 Mixed hyperlipidemia: Secondary | ICD-10-CM

## 2020-11-17 DIAGNOSIS — I10 Essential (primary) hypertension: Secondary | ICD-10-CM | POA: Diagnosis not present

## 2020-11-17 MED ORDER — TIZANIDINE HCL 4 MG PO TABS: 4.0000 mg | ORAL_TABLET | Freq: Four times a day (QID) | ORAL | 3 refills | Status: DC | PRN

## 2020-11-17 MED ORDER — MELOXICAM 15 MG PO TABS: 15.0000 mg | ORAL_TABLET | Freq: Every day | ORAL | 0 refills | Status: DC

## 2020-11-17 MED ORDER — ATORVASTATIN CALCIUM 80 MG PO TABS
80.0000 mg | ORAL_TABLET | Freq: Every day | ORAL | 1 refills | Status: DC
Start: 2020-11-17 — End: 2021-05-08

## 2020-11-17 NOTE — Progress Notes (Signed)
Dylan Munoz. - 44 y.o. male MRN [id number]  Date of birth: 07-Jan-1976  Subjective No chief complaint on file.   HPI Dylan Munoz. is a 44 y.o. male here today for follow up of HTN, HLD and weight management.    He was started on wegovy at last visit due to obesity and co-morbidities.  He is tolerating this well.  Weight down about 7 lbs since last visit.  Currently on 1mg  dose. He is going to the gym more frequently but hasn't really started making changes to food choices.   HTN is managed with losartan 50mg  daily.  He is tolerating this well.  BP has been better controlled at his last couple of visit.  He denies chest pain, shortness of breath, palpitations, headache or vision changes.    He continues to tolerate atorvastatin well without myalgias. The 10-year ASCVD risk score DC ., et al., 2013) is: 7.4%  He has some increased neck pain and stiffness when waking up in the morning.  Admits that his pillow and mattress are not very good. He doesn't feel like flexeril has helped much.  He denies headache.   ROS:  A comprehensive ROS was completed and negative except as noted per HPI     Allergies  Allergen Reactions  . Amlodipine Other (See Comments)    Constipation    Past Medical History:  Diagnosis Date  . High cholesterol   . Hypertension   . Morbid obesity (HCC) 04/17/2016  . Severe obstructive sleep apnea 04/17/2016   AHI 14.9 with desats <88% for >90 mins on SNAP report 04/02/18. See scanned document.  . Testosterone deficiency 04/19/2016  . Vitamin D deficiency 04/19/2016    Past Surgical History:  Procedure Laterality Date  . KNEE ARTHROSCOPY W/ ACL RECONSTRUCTION      Social History   Socioeconomic History  . Marital status: Single    Spouse name: Not on file  . Number of children: Not on file  . Years of education: Not on file  . Highest education level: Not on file  Occupational History  . Not on file  Tobacco Use  . Smoking status: Never  Smoker  . Smokeless tobacco: Current User    Types: Chew  Vaping Use  . Vaping Use: Never used  Substance and Sexual Activity  . Alcohol use: Yes    Alcohol/week: 0.0 standard drinks    Comment: weekly  . Drug use: No  . Sexual activity: Yes  Other Topics Concern  . Not on file  Social History Narrative  . Not on file   Social Determinants of Health   Financial Resource Strain:   . Difficulty of Paying Living Expenses: Not on file  Food Insecurity:   . Worried About 06/19/2016 in the Last Year: Not on file  . Ran Out of Food in the Last Year: Not on file  Transportation Needs:   . Lack of Transportation (Medical): Not on file  . Lack of Transportation (Non-Medical): Not on file  Physical Activity:   . Days of Exercise per Week: Not on file  . Minutes of Exercise per Session: Not on file  Stress:   . Feeling of Stress : Not on file  Social Connections:   . Frequency of Communication with Friends and Family: Not on file  . Frequency of Social Gatherings with Friends and Family: Not on file  . Attends Religious Services: Not on file  . Active Member of Clubs or  Organizations: Not on file  . Attends Banker Meetings: Not on file  . Marital Status: Not on file    Family History  Problem Relation Age of Onset  . Heart disease Father   . Diabetes Paternal Uncle   . Colon cancer Neg Hx   . Esophageal cancer Neg Hx     Health Maintenance  Topic Date Due  . Hepatitis C Screening  Never done  . HIV Screening  02/24/2021 (Originally 04/03/1991)  . TETANUS/TDAP  04/17/2026  . INFLUENZA VACCINE  Completed  . COVID-19 Vaccine  Completed     ----------------------------------------------------------------------------------------------------------------------------------------------------------------------------------------------------------------- Physical Exam BP 139/85 (BP Location: Left Arm, Patient Position: Sitting, Cuff Size: Large)   Pulse (!)  107   Temp 98.1 F (36.7 C)   Ht 6' (1.829 m)   Wt (!) 317 lb (143.8 kg)   SpO2 98%   BMI 42.99 kg/m   Physical Exam Constitutional:      Appearance: Normal appearance.  HENT:     Head: Normocephalic and atraumatic.  Cardiovascular:     Rate and Rhythm: Normal rate and regular rhythm.  Pulmonary:     Effort: Pulmonary effort is normal.     Breath sounds: Normal breath sounds.  Neurological:     General: No focal deficit present.     Mental Status: He is alert.  Psychiatric:        Mood and Affect: Mood normal.        Behavior: Behavior normal.     ------------------------------------------------------------------------------------------------------------------------------------------------------------------------------------------------------------------- Assessment and Plan  Hyperlipidemia Tolerating atorvastatin well.  Lab Results  Component Value Date   LDLCALC 46 02/18/2020  Continue at current strength and recheck at next visit.  Medication renewed.   HTN (hypertension) Blood pressure is at goal at for age and co-morbidities.  I recommend continuation of losartan at current strength.  In addition they were instructed to follow a low sodium diet with regular exercise to help to maintain adequate control of blood pressure.    Alcohol abuse He has cut back significantly on his EtOH intake.  He is exercising more.   Morbid obesity (HCC) He is tolerating wegovy at current dose without significant side effects.  Some success with weight loss.  Discussed working on portion control and healthier food choices. Encouraged to continue frequent exercise.   Cutting back on EtOH has likely helped some as well.  Return in about 3 months (around 02/15/2021) for HTN/Labs/Weight mgmt.    Meds ordered this encounter  Medications  . atorvastatin (LIPITOR) 80 MG tablet    Sig: Take 1 tablet (80 mg total) by mouth daily.    Dispense:  90 tablet    Refill:  1  . tiZANidine  (ZANAFLEX) 4 MG tablet    Sig: Take 1 tablet (4 mg total) by mouth every 6 (six) hours as needed for muscle spasms.    Dispense:  30 tablet    Refill:  3  . meloxicam (MOBIC) 15 MG tablet    Sig: Take 1 tablet (15 mg total) by mouth daily.    Dispense:  30 tablet    Refill:  0    Return in about 3 months (around 02/15/2021) for HTN/Labs/Weight mgmt.    This visit occurred during the SARS-CoV-2 public health emergency.  Safety protocols were in place, including screening questions prior to the visit, additional usage of staff PPE, and extensive cleaning of exam room while observing appropriate contact time as indicated for disinfecting solutions.

## 2020-11-17 NOTE — Assessment & Plan Note (Signed)
He is tolerating wegovy at current dose without significant side effects.  Some success with weight loss.  Discussed working on portion control and healthier food choices. Encouraged to continue frequent exercise.   Cutting back on EtOH has likely helped some as well.  Return in about 3 months (around 02/15/2021) for HTN/Labs/Weight mgmt.

## 2020-11-17 NOTE — Assessment & Plan Note (Signed)
Tolerating atorvastatin well.  Lab Results  Component Value Date   LDLCALC 46 02/18/2020  Continue at current strength and recheck at next visit.  Medication renewed.

## 2020-11-17 NOTE — Assessment & Plan Note (Signed)
He has cut back significantly on his EtOH intake.  He is exercising more.

## 2020-11-17 NOTE — Assessment & Plan Note (Signed)
Blood pressure is at goal at for age and co-morbidities.  I recommend continuation of losartan at current strength.  In addition they were instructed to follow a low sodium diet with regular exercise to help to maintain adequate control of blood pressure.   

## 2020-11-17 NOTE — Patient Instructions (Signed)
Try tizanidine to replace flexeril.  Try meloxicam as needed.  Continue all other medications.

## 2020-11-20 ENCOUNTER — Encounter: Payer: Self-pay | Admitting: Family Medicine

## 2020-11-21 ENCOUNTER — Other Ambulatory Visit: Payer: Self-pay

## 2020-11-21 DIAGNOSIS — E349 Endocrine disorder, unspecified: Secondary | ICD-10-CM

## 2020-11-21 MED ORDER — "SYRINGE 22G X 1-1/2"" 3 ML MISC": 99 refills | Status: DC

## 2020-11-21 MED ORDER — "NEEDLE (DISP) 18G X 1"" MISC": 99 refills | Status: DC

## 2020-11-30 ENCOUNTER — Ambulatory Visit: Payer: Managed Care, Other (non HMO) | Admitting: Skilled Nursing Facility1

## 2020-12-01 ENCOUNTER — Other Ambulatory Visit: Payer: Self-pay | Admitting: Osteopathic Medicine

## 2020-12-01 ENCOUNTER — Encounter: Payer: Self-pay | Admitting: Family Medicine

## 2020-12-07 ENCOUNTER — Other Ambulatory Visit: Payer: Self-pay | Admitting: Family Medicine

## 2020-12-17 ENCOUNTER — Other Ambulatory Visit: Payer: Self-pay | Admitting: Family Medicine

## 2020-12-17 ENCOUNTER — Encounter: Payer: Self-pay | Admitting: Family Medicine

## 2020-12-18 ENCOUNTER — Other Ambulatory Visit: Payer: Self-pay

## 2020-12-18 DIAGNOSIS — E349 Endocrine disorder, unspecified: Secondary | ICD-10-CM

## 2020-12-19 MED ORDER — TESTOSTERONE CYPIONATE 200 MG/ML IM SOLN: 100.0000 mg | INTRAMUSCULAR | 2 refills | Status: DC

## 2020-12-27 ENCOUNTER — Other Ambulatory Visit: Payer: Self-pay | Admitting: *Deleted

## 2021-01-13 ENCOUNTER — Other Ambulatory Visit: Payer: Self-pay | Admitting: Family Medicine

## 2021-02-01 ENCOUNTER — Encounter: Payer: Managed Care, Other (non HMO) | Attending: Family Medicine | Admitting: Skilled Nursing Facility1

## 2021-02-01 ENCOUNTER — Other Ambulatory Visit: Payer: Self-pay

## 2021-02-01 DIAGNOSIS — E669 Obesity, unspecified: Secondary | ICD-10-CM | POA: Insufficient documentation

## 2021-02-01 NOTE — Progress Notes (Signed)
  Assessment:  Primary concerns today: obesity.    Pt state she is not very hungry in the morning. Pt states he used to eat once a day but is now eating 2 times a day.   Pt states he likes to come back in 6 mnths to track progress.   Body Composition Scale 08/03/2020 02/01/2021  Current Body Weight 328 300.7  Total Body Fat % 37.6 35.1  Visceral Fat 31 27  Fat-Free Mass % 62.3 64.8   Total Body Water % 43.3 45.8  Muscle-Mass lbs 61.9 56.7  BMI 44.6 40.9  Body Fat Displacement           Torso  lbs 76.7 65.6         Left Leg  lbs 15.3 13.1         Right Leg  lbs 15.3 13.1         Left Arm  lbs 7.6 6.5         Right Arm   lbs 7.6 6.5    MEDICATIONS: see list   DIETARY INTAKE:  Usual eating pattern includes 1 meals and 1 snacks per day.  Everyday foods include none stated.  Avoided foods include none stated.    24-hr recall:  B ( 7AM): half ham sandwich or skipped Snk ( AM):  L ( PM): skipped or grilled chicken wrap  Snk ( PM):  D ( 6:30 PM): salad: tomato, cheese, cucumber, onion, bacon bits, ranch or chicken  Snk ( PM): ice cream Beverages: gatorade zero, gallon water, soda, sweet tea, red bull  Usual physical activity: thursday thru Sunday: 6-7:30; Saturday 9-11; weight machine and bike      Intervention:  Nutrition counseling. Dietitian educated pt on balanced meals within the context of HTN and weight management.   Goals: Eat breakfast Have more of a dinner  Teaching Method Utilized: Visual Auditory Hands on  Handouts given during visit include:  Detailed MyPlate  Barriers to learning/adherence to lifestyle change: none identified   Demonstrated degree of understanding via:  Teach Back   Monitoring/Evaluation:  Dietary intake, exercise, and body weight prn.

## 2021-02-16 ENCOUNTER — Other Ambulatory Visit: Payer: Self-pay

## 2021-02-16 ENCOUNTER — Ambulatory Visit: Payer: Managed Care, Other (non HMO) | Admitting: Family Medicine

## 2021-02-16 ENCOUNTER — Encounter: Payer: Self-pay | Admitting: Family Medicine

## 2021-02-16 DIAGNOSIS — G4733 Obstructive sleep apnea (adult) (pediatric): Secondary | ICD-10-CM

## 2021-02-16 DIAGNOSIS — I10 Essential (primary) hypertension: Secondary | ICD-10-CM | POA: Diagnosis not present

## 2021-02-16 DIAGNOSIS — R0789 Other chest pain: Secondary | ICD-10-CM | POA: Diagnosis not present

## 2021-02-16 MED ORDER — HYDROXYZINE PAMOATE 25 MG PO CAPS: 25.0000 mg | ORAL_CAPSULE | Freq: Three times a day (TID) | ORAL | 0 refills | Status: DC | PRN

## 2021-02-16 MED ORDER — OMEPRAZOLE 40 MG PO CPDR: 40.0000 mg | DELAYED_RELEASE_CAPSULE | Freq: Every day | ORAL | 3 refills | Status: DC

## 2021-02-16 NOTE — Patient Instructions (Signed)
Let's try adding omeprazole.  If chest pain worsens or you develop other symptoms please let me know.  Follow up with me in about 3 months.

## 2021-02-18 DIAGNOSIS — R079 Chest pain, unspecified: Secondary | ICD-10-CM | POA: Insufficient documentation

## 2021-02-18 NOTE — Assessment & Plan Note (Signed)
Atypical type chest pain.  Symptoms seem consistent with GERD.  There may be an anxiety component contributing as well.  We will start omeprazole daily over the next couple of weeks.  We will add hydroxyzine as needed.

## 2021-02-18 NOTE — Assessment & Plan Note (Signed)
Weight is down about 30 pounds since starting Wegovy.  He will continue this until the end of his 14-month trial.  Continue to work on dietary and lifestyle changes.

## 2021-02-18 NOTE — Assessment & Plan Note (Signed)
Blood pressure remains well controlled at this time.  He will continue losartan at current strength.  In addition I recommended that he follow a low-sodium diet with continued exercise and weight loss efforts.

## 2021-02-18 NOTE — Progress Notes (Signed)
Dylan Munoz. - 45 y.o. male MRN [id number]  Date of birth: [birth year]  Subjective Chief Complaint  Patient presents with   Hypertension   Chest Pain    HPI Dylan Munoz. is a 45 y.o. male here today for follow up of HTN and weight mgmt.  He also has had some intermittent chest pain.  HTN is currently managed with losartan 50 mg daily.  He is doing well with this and denies side effects.  Blood pressures at home have been well controlled.  He has had some intermittent chest pain.  Chest pain is located in the epigastric and lower chest area.  Pain does not radiate.  He denies any associated shortness of breath.  This does not worsen with exercise or activity.  Pain is nonpleuritic in nature.  He does admit to having some reflux symptoms.  He also admits to having anxiety nearly every day.  He does not want to start a medication that he has to take on a daily basis.  He has been taking [place] for weight management.  He has had pretty decent success with this with weight loss of approximately 30 pounds.  He is reaching the end of his 69-month trial.  He may be interested in trying to switch to Saxenda at the end of this.  He has not noted any significant side effects from medications so far.  He does continue to exercise a few days per week, mostly lifting weights.  ROS:  A comprehensive ROS was completed and negative except as noted per HPI  Allergies  Allergen Reactions   Amlodipine Other (See Comments)    Constipation    Past Medical History:  Diagnosis Date   High cholesterol    Hypertension    Morbid obesity (HCC) 04/17/2016   Severe obstructive sleep apnea 04/17/2016   AHI 14.9 with desats <88% for >90 mins on SNAP report 04/02/18. See scanned document.   Testosterone deficiency 04/19/2016   Vitamin D deficiency 04/19/2016    Past Surgical History:  Procedure Laterality Date   KNEE ARTHROSCOPY W/ ACL RECONSTRUCTION      Social History   Socioeconomic History    Marital status: Single    Spouse name: Not on file   Number of children: Not on file   Years of education: Not on file   Highest education level: Not on file  Occupational History   Not on file  Tobacco Use   Smoking status: Never Smoker   Smokeless tobacco: Current User    Types: Chew  Vaping Use   Vaping Use: Never used  Substance and Sexual Activity   Alcohol use: Yes    Alcohol/week: 0.0 standard drinks    Comment: weekly   Drug use: No   Sexual activity: Yes  Other Topics Concern   Not on file  Social History Narrative   Not on file   Social Determinants of Health   Financial Resource Strain: Not on file  Food Insecurity: Not on file  Transportation Needs: Not on file  Physical Activity: Not on file  Stress: Not on file  Social Connections: Not on file    Family History  Problem Relation Age of Onset   Heart disease Father    Diabetes Paternal Uncle    Colon cancer Neg Hx    Esophageal cancer Neg Hx     Health Maintenance  Topic Date Due   Hepatitis C Screening  Never done   COVID-19 Vaccine (3 - Booster for  Moderna series) 02/11/2021   HIV Screening  02/24/2021 (Originally 04/03/1991)   TETANUS/TDAP  04/17/2026   INFLUENZA VACCINE  Completed   HPV VACCINES  Aged Out     ----------------------------------------------------------------------------------------------------------------------------------------------------------------------------------------------------------------- Physical Exam BP 134/87 (BP Location: Left Arm, Patient Position: Sitting, Cuff Size: Large)    Pulse (!) 103    Temp 98 F (36.7 C)    Wt 294 lb 11.2 oz (133.7 kg)    SpO2 98%    BMI 39.97 kg/m   Physical Exam Constitutional:      Appearance: He is well-developed.  Eyes:     General: No scleral icterus. Cardiovascular:     Rate and Rhythm: Normal rate and regular rhythm.  Pulmonary:     Effort: Pulmonary effort is normal.     Breath sounds:  Normal breath sounds.  Musculoskeletal:     Cervical back: Neck supple.  Skin:    General: Skin is warm and dry.  Neurological:     General: No focal deficit present.     Mental Status: He is alert.  Psychiatric:        Mood and Affect: Mood normal.        Behavior: Behavior normal.     ------------------------------------------------------------------------------------------------------------------------------------------------------------------------------------------------------------------- Assessment and Plan  HTN (hypertension) Blood pressure remains well controlled at this time.  He will continue losartan at current strength.  In addition I recommended that he follow a low-sodium diet with continued exercise and weight loss efforts.  Severe obstructive sleep apnea He is using CPAP.  Recommend continuation.  Morbid obesity (HCC) Weight is down about 30 pounds since starting Wegovy.  He will continue this until the end of his 53-month trial.  Continue to work on dietary and lifestyle changes.  Chest pain Atypical type chest pain.  Symptoms seem consistent with GERD.  There may be an anxiety component contributing as well.  We will start omeprazole daily over the next couple of weeks.  We will add hydroxyzine as needed.   Meds ordered this encounter  Medications   omeprazole (PRILOSEC) 40 MG capsule    Sig: Take 1 capsule (40 mg total) by mouth daily.    Dispense:  30 capsule    Refill:  3   hydrOXYzine (VISTARIL) 25 MG capsule    Sig: Take 1 capsule (25 mg total) by mouth 3 (three) times daily as needed for anxiety.    Dispense:  30 capsule    Refill:  0    Return in about 3 months (around 05/19/2021) for HTN/Weight mgmt.    This visit occurred during the SARS-CoV-2 public health emergency.  Safety protocols were in place, including screening questions prior to the visit, additional usage of staff PPE, and extensive cleaning of exam room while observing appropriate  contact time as indicated for disinfecting solutions.

## 2021-02-18 NOTE — Assessment & Plan Note (Signed)
He is using CPAP.  Recommend continuation.

## 2021-02-28 ENCOUNTER — Encounter: Payer: Self-pay | Admitting: Family Medicine

## 2021-02-28 ENCOUNTER — Other Ambulatory Visit: Payer: Self-pay | Admitting: Family Medicine

## 2021-03-11 ENCOUNTER — Encounter: Payer: Self-pay | Admitting: Family Medicine

## 2021-03-15 ENCOUNTER — Other Ambulatory Visit: Payer: Self-pay | Admitting: Family Medicine

## 2021-03-15 MED ORDER — SAXENDA 18 MG/3ML ~~LOC~~ SOPN: PEN_INJECTOR | SUBCUTANEOUS | 0 refills | Status: DC

## 2021-03-19 ENCOUNTER — Encounter: Payer: Self-pay | Admitting: Family Medicine

## 2021-03-21 ENCOUNTER — Other Ambulatory Visit: Payer: Self-pay

## 2021-03-21 ENCOUNTER — Emergency Department (HOSPITAL_BASED_OUTPATIENT_CLINIC_OR_DEPARTMENT_OTHER)
Admission: EM | Admit: 2021-03-21 | Discharge: 2021-03-21 | Disposition: A | Discharge status: Home / Self Care | Payer: Managed Care, Other (non HMO) | Attending: Emergency Medicine | Admitting: Emergency Medicine

## 2021-03-21 ENCOUNTER — Encounter (HOSPITAL_BASED_OUTPATIENT_CLINIC_OR_DEPARTMENT_OTHER): Payer: Self-pay | Admitting: Emergency Medicine

## 2021-03-21 DIAGNOSIS — I10 Essential (primary) hypertension: Secondary | ICD-10-CM | POA: Insufficient documentation

## 2021-03-21 DIAGNOSIS — Z79899 Other long term (current) drug therapy: Secondary | ICD-10-CM | POA: Diagnosis not present

## 2021-03-21 DIAGNOSIS — M545 Low back pain, unspecified: Secondary | ICD-10-CM | POA: Insufficient documentation

## 2021-03-21 LAB — URINALYSIS, ROUTINE W REFLEX MICROSCOPIC
Bilirubin Urine: NEGATIVE
Glucose, UA: NEGATIVE mg/dL
Hgb urine dipstick: NEGATIVE
Ketones, ur: NEGATIVE mg/dL
Leukocytes,Ua: NEGATIVE
Nitrite: NEGATIVE
Protein, ur: NEGATIVE mg/dL
Specific Gravity, Urine: 1.025 (ref 1.005–1.030)
pH: 6 (ref 5.0–8.0)

## 2021-03-21 LAB — CBG MONITORING, ED: Glucose-Capillary: 95 mg/dL (ref 70–99)

## 2021-03-21 MED ORDER — ETODOLAC 300 MG PO CAPS: 300.0000 mg | ORAL_CAPSULE | Freq: Three times a day (TID) | ORAL | 0 refills | Status: DC

## 2021-03-21 NOTE — Discharge Instructions (Signed)
Try taking over-the-counter topical pain patches, Salonpas in addition to prescribed medication.  With your doctor next week if the symptoms persist.  Return to the ED for trouble with fever vomiting , numbness, weakness

## 2021-03-21 NOTE — ED Provider Notes (Signed)
[facility] EMERGENCY DEPARTMENT Provider Note   CSN: [id number] Arrival date & time: 03/21/21  0701     History Chief Complaint  Patient presents with  . Back Pain    Dylan Munoz. is a 45 y.o. male.  HPI   Patient presents to the ED with complaints of back pain.  Patient states he has been having symptoms over a week.  He will have intermittent pain in his lower back.  It is sharp and at times it is occurring just while he is at rest.  The pain is on both left and right side.  Sometimes in the middle.  He was worried it could be related to his kidneys although is not having any issues with urinary frequency or discomfort with urination.  Patient does not recall any injuries or falls.  No abdominal pain.  Past Medical History:  Diagnosis Date  . High cholesterol   . Hypertension   . Morbid obesity (HCC) 04/17/2016  . Severe obstructive sleep apnea 04/17/2016   AHI 14.9 with desats <88% for >90 mins on SNAP report 04/02/18. See scanned document.  . Testosterone deficiency 04/19/2016  . Vitamin D deficiency 04/19/2016    Patient Active Problem List   Diagnosis Date Noted  . Chest pain 02/18/2021  . Palpitations 04/04/2020  . Alcohol-induced acute pancreatitis without infection or necrosis 04/03/2020  . Sinus tachycardia 04/03/2020  . Dizziness 02/25/2020  . Alcohol abuse 07/04/2019  . B12 deficiency 11/09/2018  . Constipation 06/26/2018  . Cramping of hands 05/28/2016  . Hyperlipidemia 04/19/2016  . Testosterone deficiency 04/19/2016  . Vitamin D deficiency 04/19/2016  . HTN (hypertension) 04/17/2016  . Morbid obesity (HCC) 04/17/2016  . Severe obstructive sleep apnea 04/17/2016  . Left knee pain 04/17/2016  . Paresthesia and pain of both upper extremities 04/17/2016    Past Surgical History:  Procedure Laterality Date  . KNEE ARTHROSCOPY W/ ACL RECONSTRUCTION         Family History  Problem Relation Age of Onset  . Heart disease Father   . Diabetes  Paternal Uncle   . Colon cancer Neg Hx   . Esophageal cancer Neg Hx     Social History   Tobacco Use  . Smoking status: Never Smoker  . Smokeless tobacco: Current User    Types: Chew  Vaping Use  . Vaping Use: Never used  Substance Use Topics  . Alcohol use: Yes    Alcohol/week: 0.0 standard drinks    Comment: weekly  . Drug use: No    Home Medications Prior to Admission medications   Medication Sig Start Date End Date Taking? Authorizing Provider  etodolac (LODINE) 300 MG capsule Take 1 capsule (300 mg total) by mouth every 8 (eight) hours. 03/21/21  Yes Linwood Dibbles, MD  Liraglutide -Weight Management (SAXENDA) 18 MG/3ML SOPN Inject 0.6mg  daily x1 week.  Increase by 0.6mg  at weekly intervals until 3mg /day is reached 03/15/21   03/17/21, DO  Alcohol Swabs (ALCOHOL PREP) PADS Use to clean skin and vials for testosterone injections. 11/12/17   11/14/17, MD  AMBULATORY NON FORMULARY MEDICATION Please set CPAP to auto-titration from 10-20.  \  Adjust mask fit.  Send 30 day compliance report 30 days after change is made.   Send to [facility] 07/30/19   08/01/19, MD  atorvastatin (LIPITOR) 80 MG tablet Take 1 tablet (80 mg total) by mouth daily. 11/17/20   14/3/21, DO  hydrOXYzine (VISTARIL) 25 MG capsule TAKE  1 CAPSULE BY MOUTH 3 TIMES DAILY AS NEEDED FOR ANXIETY. 03/15/21   Everrett Coombe, DO  losartan (COZAAR) 50 MG tablet Take 1 tablet (50 mg total) by mouth daily. 09/22/20   Everrett Coombe, DO  NEEDLE, DISP, 18 G 18G X 1" MISC Inject testosterone once weekly. 11/21/20   Everrett Coombe, DO  omeprazole (PRILOSEC) 40 MG capsule Take 1 capsule (40 mg total) by mouth daily. 02/16/21   Everrett Coombe, DO  Syringe/Needle, Disp, (SYRINGE 3CC/22GX1-1/2") 22G X 1-1/2" 3 ML MISC Inject testosterone once weekly. 11/21/20   Everrett Coombe, DO  testosterone cypionate (DEPOTESTOSTERONE CYPIONATE) 200 MG/ML injection Inject 0.5 mLs (100 mg total) into the muscle once a week. 12/19/20    Everrett Coombe, DO  tiZANidine (ZANAFLEX) 4 MG tablet Take 1 tablet (4 mg total) by mouth every 6 (six) hours as needed for muscle spasms. 11/17/20   Everrett Coombe, DO    Allergies    Amlodipine  Review of Systems   Review of Systems  All other systems reviewed and are negative.   Physical Exam Updated Vital Signs BP (!) 146/101 (BP Location: Right Arm)   Pulse (!) 105   Temp 98 F (36.7 C) (Oral)   Resp 20   Ht 1.829 m (6')   Wt 92.1 kg   SpO2 98%   BMI 27.53 kg/m   Physical Exam Vitals and nursing note reviewed.  Constitutional:      General: He is not in acute distress.    Appearance: He is well-developed.  HENT:     Head: Normocephalic and atraumatic.     Right Ear: External ear normal.     Left Ear: External ear normal.  Eyes:     General: No scleral icterus.       Right eye: No discharge.        Left eye: No discharge.     Conjunctiva/sclera: Conjunctivae normal.  Neck:     Trachea: No tracheal deviation.  Cardiovascular:     Rate and Rhythm: Normal rate.  Pulmonary:     Effort: Pulmonary effort is normal. No respiratory distress.     Breath sounds: No stridor.  Abdominal:     General: There is no distension.  Musculoskeletal:        General: No swelling or deformity.     Cervical back: Neck supple.     Comments: No CVA tenderness bilaterally, no tenderness palpation of the spine or paraspinal muscles  Skin:    General: Skin is warm and dry.     Findings: No rash.  Neurological:     Mental Status: He is alert.     Cranial Nerves: Cranial nerve deficit: no gross deficits.     ED Results / Procedures / Treatments   Labs (all labs ordered are listed, but only abnormal results are displayed) Labs Reviewed  URINALYSIS, ROUTINE W REFLEX MICROSCOPIC - Abnormal; Notable for the following components:      Result Value   APPearance CLOUDY (*)    All other components within normal limits  CBG MONITORING, ED     Procedures Procedures   Medications  Ordered in ED Medications - No data to display  ED Course  I have reviewed the triage vital signs and the nursing notes.  Pertinent labs & imaging results that were available during my care of the patient were reviewed by me and considered in my medical decision making (see chart for details).  Clinical Course as of 03/21/21 0802  Wed Mar 21, 2021  0757 UA normal.  Blood sugar normal. [JK]    Clinical Course User Index [JK] Linwood Dibbles, MD   MDM Rules/Calculators/A&P                          Patient presents to the ED with complaints of back pain.  He is not having any fevers or chills.  No abdominal pain.  No numbness or weakness.  Doubt referred pain from abdominal pathology.  Patient was concerned about his kidneys but he urinating frequently and has a normal urinalysis.  Doubt urinary tract infection or ureterolithiasis.  CBG is normal.  Suspect this could be musculoskeletal in nature although not entirely clear.  We will have him try course of NSAIDs.  Also topical lidocaine patches.  Follow-up with his primary doctor next week to be rechecked Final Clinical Impression(s) / ED Diagnoses Final diagnoses:  Low back pain without sciatica, unspecified back pain laterality, unspecified chronicity    Rx / DC Orders ED Discharge Orders         Ordered    etodolac (LODINE) 300 MG capsule  Every 8 hours       Note to Pharmacy: As needed for pain   03/21/21 0802           Linwood Dibbles, MD 03/21/21 [id number]

## 2021-03-21 NOTE — ED Triage Notes (Signed)
Back pain x 1 week states  Both sides hurts back and forth  Rt middle bqack , denies injury  Denies dysuria

## 2021-03-29 ENCOUNTER — Other Ambulatory Visit: Payer: Self-pay | Admitting: Family Medicine

## 2021-03-29 DIAGNOSIS — E349 Endocrine disorder, unspecified: Secondary | ICD-10-CM

## 2021-04-10 ENCOUNTER — Other Ambulatory Visit: Payer: Self-pay | Admitting: Family Medicine

## 2021-04-16 ENCOUNTER — Encounter: Payer: Self-pay | Admitting: Family Medicine

## 2021-04-16 NOTE — Telephone Encounter (Signed)
He should contact insurance and see if saxenda is covered in place of wegovy.

## 2021-04-21 ENCOUNTER — Other Ambulatory Visit: Payer: Self-pay | Admitting: Family Medicine

## 2021-05-07 ENCOUNTER — Other Ambulatory Visit: Payer: Self-pay | Admitting: Family Medicine

## 2021-05-14 ENCOUNTER — Other Ambulatory Visit: Payer: Self-pay | Admitting: Family Medicine

## 2021-05-18 ENCOUNTER — Ambulatory Visit: Payer: Managed Care, Other (non HMO) | Admitting: Family Medicine

## 2021-06-08 ENCOUNTER — Other Ambulatory Visit: Payer: Self-pay

## 2021-06-08 ENCOUNTER — Encounter: Payer: Self-pay | Admitting: Family Medicine

## 2021-06-08 ENCOUNTER — Ambulatory Visit (INDEPENDENT_AMBULATORY_CARE_PROVIDER_SITE_OTHER): Payer: Managed Care, Other (non HMO) | Admitting: Family Medicine

## 2021-06-08 VITALS — BP 135/82 | HR 108 | Ht 72.0 in | Wt 304.0 lb

## 2021-06-08 DIAGNOSIS — I10 Essential (primary) hypertension: Secondary | ICD-10-CM

## 2021-06-08 DIAGNOSIS — E782 Mixed hyperlipidemia: Secondary | ICD-10-CM | POA: Diagnosis not present

## 2021-06-08 DIAGNOSIS — R7303 Prediabetes: Secondary | ICD-10-CM

## 2021-06-08 NOTE — Patient Instructions (Signed)
We'll be in touch with lab results.  Continue current medications.

## 2021-06-09 LAB — COMPREHENSIVE METABOLIC PANEL
AG Ratio: 1.4 (calc) (ref 1.0–2.5)
ALT: 45 U/L (ref 9–46)
AST: 28 U/L (ref 10–40)
Albumin: 4.4 g/dL (ref 3.6–5.1)
Alkaline phosphatase (APISO): 102 U/L (ref 36–130)
BUN: 11 mg/dL (ref 7–25)
CO2: 28 mmol/L (ref 20–32)
Calcium: 9.8 mg/dL (ref 8.6–10.3)
Chloride: 102 mmol/L (ref 98–110)
Creat: 1.28 mg/dL (ref 0.60–1.35)
Globulin: 3.1 g/dL (calc) (ref 1.9–3.7)
Glucose, Bld: 117 mg/dL — ABNORMAL HIGH (ref 65–99)
Potassium: 4.9 mmol/L (ref 3.5–5.3)
Sodium: 140 mmol/L (ref 135–146)
Total Bilirubin: 0.6 mg/dL (ref 0.2–1.2)
Total Protein: 7.5 g/dL (ref 6.1–8.1)

## 2021-06-09 LAB — CBC WITH DIFFERENTIAL/PLATELET
Absolute Monocytes: 571 cells/uL (ref 200–950)
Basophils Absolute: 41 cells/uL (ref 0–200)
Basophils Relative: 0.6 %
Eosinophils Absolute: 61 cells/uL (ref 15–500)
Eosinophils Relative: 0.9 %
HCT: 46.8 % (ref 38.5–50.0)
Hemoglobin: 15.8 g/dL (ref 13.2–17.1)
Lymphs Abs: 1639 cells/uL (ref 850–3900)
MCH: 30.7 pg (ref 27.0–33.0)
MCHC: 33.8 g/dL (ref 32.0–36.0)
MCV: 90.9 fL (ref 80.0–100.0)
MPV: 9.6 fL (ref 7.5–12.5)
Monocytes Relative: 8.4 %
Neutro Abs: 4488 cells/uL (ref 1500–7800)
Neutrophils Relative %: 66 %
Platelets: 292 10*3/uL (ref 140–400)
RBC: 5.15 10*6/uL (ref 4.20–5.80)
RDW: 13.8 % (ref 11.0–15.0)
Total Lymphocyte: 24.1 %
WBC: 6.8 10*3/uL (ref 3.8–10.8)

## 2021-06-09 LAB — LIPID PANEL W/REFLEX DIRECT LDL
Cholesterol: 173 mg/dL (ref <200)
HDL: 45 mg/dL (ref >=40)
LDL Cholesterol (Calc): 113 mg/dL (calc) — ABNORMAL HIGH
Non-HDL Cholesterol (Calc): 128 mg/dL (calc) (ref <130)
Total CHOL/HDL Ratio: 3.8 (calc) (ref <5.0)
Triglycerides: 64 mg/dL (ref <150)

## 2021-06-09 LAB — HEMOGLOBIN A1C
Hgb A1c MFr Bld: 6.6 % of total Hgb — ABNORMAL HIGH (ref <5.7)
Mean Plasma Glucose: 143 mg/dL
eAG (mmol/L): 7.9 mmol/L

## 2021-06-10 ENCOUNTER — Encounter: Payer: Self-pay | Admitting: Family Medicine

## 2021-06-10 NOTE — Assessment & Plan Note (Signed)
Update lipid panel today 

## 2021-06-10 NOTE — Assessment & Plan Note (Signed)
Continue lifestyle and exercise changes to help with weight management.  Encouraged to work on dietary changes.  Medications for weight loss were not covered by insurance.  Phentermine may be an option since BP is now under better control.

## 2021-06-10 NOTE — Assessment & Plan Note (Signed)
" >>  ASSESSMENT AND PLAN FOR PREDIABETES WRITTEN ON 06/10/2021 11:10 PM BY Tandy Lewin, DO  Update a1c.  If this remains elevated we may be able to utilize Ozempic  or Rybelsus  for management this as well as weight.  "

## 2021-06-10 NOTE — Assessment & Plan Note (Signed)
Update a1c.  If this remains elevated we may be able to utilize Ozempic or Rybelsus for management this as well as weight.

## 2021-06-10 NOTE — Assessment & Plan Note (Signed)
Blood pressure is at goal at for age and co-morbidities.  I recommend contination of losartan.  In addition they were instructed to follow a low sodium diet with regular exercise to help to maintain adequate control of blood pressure.

## 2021-06-10 NOTE — Progress Notes (Signed)
Dylan Munoz. - 45 y.o. male MRN [id number]  Date of birth: Mar 24, 1976  Subjective Chief Complaint  Patient presents with   weight management    HPI Dylan Munoz. Is a 45 y.o. male here today for follow up of visit.  He has been working on weight loss however has had difficulty losing and maintaining his weight.  He is going to the gym regularly.  He has not made significant changes to his diet.  We have tried to get [facility] and saxenda however there we're not covered by insurance.   He is doing well with atorvastatin for management of HLD and BP remains well controlled with losartan.  He denies chest pain, shortness of breath,palpitations, headache or vision changes.   ROS:  A comprehensive ROS was completed and negative except as noted per HPI  Allergies  Allergen Reactions   Amlodipine Other (See Comments)    Constipation    Past Medical History:  Diagnosis Date   High cholesterol    Hypertension    Morbid obesity (HCC) 04/17/2016   Severe obstructive sleep apnea 04/17/2016   AHI 14.9 with desats <88% for >90 mins on SNAP report 04/02/18. See scanned document.   Testosterone deficiency 04/19/2016   Vitamin D deficiency 04/19/2016    Past Surgical History:  Procedure Laterality Date   KNEE ARTHROSCOPY W/ ACL RECONSTRUCTION      Social History   Socioeconomic History   Marital status: Single    Spouse name: Not on file   Number of children: Not on file   Years of education: Not on file   Highest education level: Not on file  Occupational History   Not on file  Tobacco Use   Smoking status: Never   Smokeless tobacco: Current    Types: Chew  Vaping Use   Vaping Use: Never used  Substance and Sexual Activity   Alcohol use: Yes    Alcohol/week: 0.0 standard drinks    Comment: weekly   Drug use: No   Sexual activity: Yes  Other Topics Concern   Not on file  Social History Narrative   Not on file   Social Determinants of Health   Financial Resource Strain:  Not on file  Food Insecurity: Not on file  Transportation Needs: Not on file  Physical Activity: Not on file  Stress: Not on file  Social Connections: Not on file    Family History  Problem Relation Age of Onset   Heart disease Father    Diabetes Paternal Uncle    Colon cancer Neg Hx    Esophageal cancer Neg Hx     Health Maintenance  Topic Date Due   Pneumococcal Vaccine 39-32 Years old (1 - PCV) Never done   HIV Screening  Never done   Hepatitis C Screening  Never done   COVID-19 Vaccine (3 - Booster for Moderna series) 01/11/2021   COLONOSCOPY (Pts 45-83yrs Insurance coverage will need to be confirmed)  Never done   INFLUENZA VACCINE  07/16/2021   TETANUS/TDAP  04/17/2026   HPV VACCINES  Aged Out     ----------------------------------------------------------------------------------------------------------------------------------------------------------------------------------------------------------------- Physical Exam BP 135/82 (BP Location: Left Arm, Patient Position: Sitting, Cuff Size: Large)   Pulse (!) 108   Ht 6' (1.829 m)   Wt (!) 304 lb (137.9 kg)   SpO2 97%   BMI 41.23 kg/m   Physical Exam Constitutional:      Appearance: Normal appearance.  HENT:     Head: Normocephalic and atraumatic.  Eyes:  General: No scleral icterus. Cardiovascular:     Rate and Rhythm: Normal rate and regular rhythm.  Pulmonary:     Effort: Pulmonary effort is normal.     Breath sounds: Normal breath sounds.  Musculoskeletal:        General: Normal range of motion.     Cervical back: Neck supple.  Neurological:     General: No focal deficit present.     Mental Status: He is alert.  Psychiatric:        Mood and Affect: Mood normal.        Behavior: Behavior normal.     ------------------------------------------------------------------------------------------------------------------------------------------------------------------------------------------------------------------- Assessment and Plan  HTN (hypertension) Blood pressure is at goal at for age and co-morbidities.  I recommend contination of losartan.  In addition they were instructed to follow a low sodium diet with regular exercise to help to maintain adequate control of blood pressure.    Morbid obesity (HCC) Continue lifestyle and exercise changes to help with weight management.  Encouraged to work on dietary changes.  Medications for weight loss were not covered by insurance.  Phentermine may be an option since BP is now under better control.   Hyperlipidemia Update lipid panel today.   Prediabetes Update a1c.  If this remains elevated we may be able to utilize Ozempic or Rybelsus for management this as well as weight.    No orders of the defined types were placed in this encounter.   Return in about 6 months (around 12/08/2021) for HTN.    This visit occurred during the SARS-CoV-2 public health emergency.  Safety protocols were in place, including screening questions prior to the visit, additional usage of staff PPE, and extensive cleaning of exam room while observing appropriate contact time as indicated for disinfecting solutions.

## 2021-06-11 ENCOUNTER — Other Ambulatory Visit: Payer: Self-pay | Admitting: Family Medicine

## 2021-06-11 ENCOUNTER — Encounter: Payer: Self-pay | Admitting: Family Medicine

## 2021-06-11 MED ORDER — OZEMPIC (0.25 OR 0.5 MG/DOSE) 2 MG/1.5ML ~~LOC~~ SOPN: 0.5000 mg | PEN_INJECTOR | SUBCUTANEOUS | 1 refills | Status: DC

## 2021-06-11 MED ORDER — METFORMIN HCL ER 500 MG PO TB24: 500.0000 mg | ORAL_TABLET | Freq: Every day | ORAL | 1 refills | Status: DC

## 2021-06-25 ENCOUNTER — Ambulatory Visit: Payer: Managed Care, Other (non HMO) | Admitting: Medical

## 2021-07-06 ENCOUNTER — Other Ambulatory Visit: Payer: Self-pay | Admitting: Osteopathic Medicine

## 2021-07-06 DIAGNOSIS — E349 Endocrine disorder, unspecified: Secondary | ICD-10-CM

## 2021-07-27 ENCOUNTER — Ambulatory Visit (INDEPENDENT_AMBULATORY_CARE_PROVIDER_SITE_OTHER): Payer: Managed Care, Other (non HMO) | Admitting: Family Medicine

## 2021-07-27 ENCOUNTER — Encounter: Payer: Self-pay | Admitting: Family Medicine

## 2021-07-27 VITALS — BP 112/77 | HR 102 | Temp 99.2°F | Ht 72.0 in | Wt 289.0 lb

## 2021-07-27 DIAGNOSIS — I10 Essential (primary) hypertension: Secondary | ICD-10-CM | POA: Diagnosis not present

## 2021-07-27 DIAGNOSIS — M545 Low back pain, unspecified: Secondary | ICD-10-CM | POA: Insufficient documentation

## 2021-07-27 DIAGNOSIS — R7303 Prediabetes: Secondary | ICD-10-CM

## 2021-07-27 DIAGNOSIS — F1010 Alcohol abuse, uncomplicated: Secondary | ICD-10-CM

## 2021-07-27 NOTE — Assessment & Plan Note (Signed)
He has cut back on alcohol use significantly.

## 2021-07-27 NOTE — Assessment & Plan Note (Signed)
Blood pressure is at goal at for age and co-morbidities.  I recommend continuation of current medications for treatment of hypertension.  Continued weight loss encouraged..  In addition they were instructed to follow a low sodium diet with regular exercise to help to maintain adequate control of blood pressure.

## 2021-07-27 NOTE — Assessment & Plan Note (Signed)
Weight is down about 15 pounds since last visit.  Continue Ozempic and dietary/lifestyle change.

## 2021-07-27 NOTE — Assessment & Plan Note (Signed)
Last A1c was 6.6%.  Ozempic added.  Tolerating well.  We will continue.  Update A1c at follow-up visit

## 2021-07-27 NOTE — Patient Instructions (Signed)
Great to see you! Congratulations on weight loss.  Keep up the hard work! Referral placed to physical therapy, you will be contacted for appt.  Avoid using ibuprofen (motrin) with etodolac. See me again in about 2 months.

## 2021-07-27 NOTE — Assessment & Plan Note (Signed)
" >>  ASSESSMENT AND PLAN FOR PREDIABETES WRITTEN ON 07/27/2021  9:21 PM BY Cindra Austad, DO  Last A1c was 6.6%.  Ozempic  added.  Tolerating well.  We will continue.  Update A1c at follow-up visit "

## 2021-07-27 NOTE — Progress Notes (Signed)
Dylan Munoz. - 45 y.o. male MRN [id number]  Date of birth: 27-Jun-1976  Subjective No chief complaint on file.   HPI Dylan Munoz is a 45 year old male here today for a follow-up visit.  Reports overall he feels well.  He was started on Ozempic at last visit to help with better glycemic control and weight loss.  He has been exercising regularly and has been making some changes to his diet.  Weight is down about 15 pounds since last visit.  He denies any problems with tolerance of Ozempic so far.  His blood pressure remains well controlled as well.  No symptoms of hypotension.  Tolerating medication well.  He has had some left-sided back pain.  He is taking etodolac daily which is helpful.  He is also been adding some Motrin as well.  He denies any radiation of pain, numbness or tingling anywhere.  ROS:  A comprehensive ROS was completed and negative except as noted per HPI  Allergies  Allergen Reactions   Amlodipine Other (See Comments)    Constipation    Past Medical History:  Diagnosis Date   High cholesterol    Hypertension    Morbid obesity (HCC) 04/17/2016   Severe obstructive sleep apnea 04/17/2016   AHI 14.9 with desats <88% for >90 mins on SNAP report 04/02/18. See scanned document.   Testosterone deficiency 04/19/2016   Vitamin D deficiency 04/19/2016    Past Surgical History:  Procedure Laterality Date   KNEE ARTHROSCOPY W/ ACL RECONSTRUCTION      Social History   Socioeconomic History   Marital status: Single    Spouse name: Not on file   Number of children: Not on file   Years of education: Not on file   Highest education level: Not on file  Occupational History   Not on file  Tobacco Use   Smoking status: Never   Smokeless tobacco: Current    Types: Chew  Vaping Use   Vaping Use: Never used  Substance and Sexual Activity   Alcohol use: Yes    Alcohol/week: 0.0 standard drinks    Comment: weekly   Drug use: No   Sexual activity: Yes  Other Topics Concern    Not on file  Social History Narrative   Not on file   Social Determinants of Health   Financial Resource Strain: Not on file  Food Insecurity: Not on file  Transportation Needs: Not on file  Physical Activity: Not on file  Stress: Not on file  Social Connections: Not on file    Family History  Problem Relation Age of Onset   Heart disease Father    Diabetes Paternal Uncle    Colon cancer Neg Hx    Esophageal cancer Neg Hx     Health Maintenance  Topic Date Due   Pneumococcal Vaccine 38-43 Years old (1 - PCV) Never done   HIV Screening  Never done   Hepatitis C Screening  Never done   COVID-19 Vaccine (3 - Booster for Moderna series) 01/11/2021   COLONOSCOPY (Pts 45-57yrs Insurance coverage will need to be confirmed)  Never done   INFLUENZA VACCINE  07/16/2021   TETANUS/TDAP  04/17/2026   HPV VACCINES  Aged Out     ----------------------------------------------------------------------------------------------------------------------------------------------------------------------------------------------------------------- Physical Exam BP 112/77 (BP Location: Left Arm, Patient Position: Sitting, Cuff Size: Large)   Pulse (!) 102   Temp 99.2 F (37.3 C)   Ht 6' (1.829 m)   Wt 289 lb (131.1 kg)   SpO2 99%  BMI 39.20 kg/m   Physical Exam Constitutional:      Appearance: Normal appearance.  HENT:     Head: Normocephalic and atraumatic.  Eyes:     General: No scleral icterus. Cardiovascular:     Rate and Rhythm: Normal rate and regular rhythm.  Musculoskeletal:     Cervical back: Neck supple.  Neurological:     General: No focal deficit present.     Mental Status: He is alert.  Psychiatric:        Mood and Affect: Mood normal.        Behavior: Behavior normal.     ------------------------------------------------------------------------------------------------------------------------------------------------------------------------------------------------------------------- Assessment and Plan  HTN (hypertension) Blood pressure is at goal at for age and co-morbidities.  I recommend continuation of current medications for treatment of hypertension.  Continued weight loss encouraged..  In addition they were instructed to follow a low sodium diet with regular exercise to help to maintain adequate control of blood pressure.    Morbid obesity (HCC) Weight is down about 15 pounds since last visit.  Continue Ozempic and dietary/lifestyle change.  Prediabetes Last A1c was 6.6%.  Ozempic added.  Tolerating well.  We will continue.  Update A1c at follow-up visit  Alcohol abuse He has cut back on alcohol use significantly.  Acute left-sided low back pain without sciatica No red flags.  Referral placed to physical therapy.  I discussed with him that he should avoid using Motrin and etodolac together.   No orders of the defined types were placed in this encounter.   Return in about 2 months (around 09/26/2021) for back pain/DM.    This visit occurred during the SARS-CoV-2 public health emergency.  Safety protocols were in place, including screening questions prior to the visit, additional usage of staff PPE, and extensive cleaning of exam room while observing appropriate contact time as indicated for disinfecting solutions.

## 2021-07-27 NOTE — Assessment & Plan Note (Signed)
No red flags.  Referral placed to physical therapy.  I discussed with him that he should avoid using Motrin and etodolac together.

## 2021-07-30 ENCOUNTER — Encounter: Payer: Managed Care, Other (non HMO) | Attending: Family Medicine | Admitting: Skilled Nursing Facility1

## 2021-08-15 ENCOUNTER — Other Ambulatory Visit: Payer: Self-pay | Admitting: Family Medicine

## 2021-08-16 ENCOUNTER — Ambulatory Visit: Payer: Managed Care, Other (non HMO) | Admitting: Physical Therapy

## 2021-08-25 ENCOUNTER — Other Ambulatory Visit: Payer: Self-pay

## 2021-08-25 ENCOUNTER — Emergency Department (HOSPITAL_BASED_OUTPATIENT_CLINIC_OR_DEPARTMENT_OTHER)
Admission: EM | Admit: 2021-08-25 | Discharge: 2021-08-25 | Disposition: A | Discharge status: Home / Self Care | Payer: Managed Care, Other (non HMO) | Attending: Emergency Medicine | Admitting: Emergency Medicine

## 2021-08-25 ENCOUNTER — Encounter (HOSPITAL_BASED_OUTPATIENT_CLINIC_OR_DEPARTMENT_OTHER): Payer: Self-pay | Admitting: *Deleted

## 2021-08-25 DIAGNOSIS — U071 COVID-19: Secondary | ICD-10-CM | POA: Insufficient documentation

## 2021-08-25 DIAGNOSIS — M549 Dorsalgia, unspecified: Secondary | ICD-10-CM | POA: Diagnosis present

## 2021-08-25 LAB — CBC WITH DIFFERENTIAL/PLATELET
Abs Immature Granulocytes: 0.01 10*3/uL (ref 0.00–0.07)
Basophils Absolute: 0 10*3/uL (ref 0.0–0.1)
Basophils Relative: 1 %
Eosinophils Absolute: 0 10*3/uL (ref 0.0–0.5)
Eosinophils Relative: 1 %
HCT: 46.6 % (ref 39.0–52.0)
Hemoglobin: 15.6 g/dL (ref 13.0–17.0)
Immature Granulocytes: 0 %
Lymphocytes Relative: 26 %
Lymphs Abs: 1.1 10*3/uL (ref 0.7–4.0)
MCH: 30.6 pg (ref 26.0–34.0)
MCHC: 33.5 g/dL (ref 30.0–36.0)
MCV: 91.6 fL (ref 80.0–100.0)
Monocytes Absolute: 0.5 10*3/uL (ref 0.1–1.0)
Monocytes Relative: 12 %
Neutro Abs: 2.6 10*3/uL (ref 1.7–7.7)
Neutrophils Relative %: 60 %
Platelets: 237 10*3/uL (ref 150–400)
RBC: 5.09 MIL/uL (ref 4.22–5.81)
RDW: 13.4 % (ref 11.5–15.5)
WBC: 4.4 10*3/uL (ref 4.0–10.5)
nRBC: 0 % (ref 0.0–0.2)

## 2021-08-25 LAB — BASIC METABOLIC PANEL
Anion gap: 7 (ref 5–15)
BUN: 9 mg/dL (ref 6–20)
CO2: 31 mmol/L (ref 22–32)
Calcium: 8.7 mg/dL — ABNORMAL LOW (ref 8.9–10.3)
Chloride: 99 mmol/L (ref 98–111)
Creatinine, Ser: 1.42 mg/dL — ABNORMAL HIGH (ref 0.61–1.24)
GFR, Estimated: >60 mL/min (ref >60)
Glucose, Bld: 115 mg/dL — ABNORMAL HIGH (ref 70–99)
Potassium: 4.2 mmol/L (ref 3.5–5.1)
Sodium: 137 mmol/L (ref 135–145)

## 2021-08-25 LAB — RESP PANEL BY RT-PCR (FLU A&B, COVID) ARPGX2
Influenza A by PCR: NEGATIVE
Influenza B by PCR: NEGATIVE
SARS Coronavirus 2 by RT PCR: POSITIVE — AB

## 2021-08-25 MED ORDER — METHOCARBAMOL 500 MG PO TABS: 500.0000 mg | ORAL_TABLET | Freq: Four times a day (QID) | ORAL | 0 refills | Status: DC

## 2021-08-25 MED ORDER — DICLOFENAC SODIUM 75 MG PO TBEC: 75.0000 mg | DELAYED_RELEASE_TABLET | Freq: Two times a day (BID) | ORAL | 0 refills | Status: DC

## 2021-08-25 MED ORDER — MOLNUPIRAVIR EUA 200MG CAPSULE: 4.0000 | ORAL_CAPSULE | Freq: Two times a day (BID) | ORAL | 0 refills | Status: AC

## 2021-08-25 NOTE — ED Triage Notes (Addendum)
Pt c/o back x 2-3 weeks. States "when I'm trying to sleep it hurts the most". Denies injury. Also states his gf was dx with covid yesterday. He reports cough, sore throat and cold chills few a few days

## 2021-08-25 NOTE — Discharge Instructions (Addendum)
Follow up with your Physician for recheck of your back pain next week

## 2021-08-25 NOTE — ED Provider Notes (Signed)
[facility] EMERGENCY DEPARTMENT Provider Note   CSN: [id number] Arrival date & time: 08/25/21  1331     History Chief Complaint  Patient presents with   Back Pain    ?covid    Dylan Munoz. is a 45 y.o. male.  Pt reports he thinks he has covid.  Pt complains of a cough and congestion   The history is provided by the patient. No language interpreter was used.  Back Pain Location:  Generalized Quality:  Aching Radiates to:  Does not radiate Pain severity:  Moderate Pain is:  Same all the time Onset quality:  Gradual Duration:  3 weeks Chronicity:  New Relieved by:  Nothing Ineffective treatments:  None tried     Past Medical History:  Diagnosis Date   High cholesterol    Hypertension    Morbid obesity (HCC) 04/17/2016   Severe obstructive sleep apnea 04/17/2016   AHI 14.9 with desats <88% for >90 mins on SNAP report 04/02/18. See scanned document.   Testosterone deficiency 04/19/2016   Vitamin D deficiency 04/19/2016    Patient Active Problem List   Diagnosis Date Noted   Acute left-sided low back pain without sciatica 07/27/2021   Chest pain 02/18/2021   Palpitations 04/04/2020   Sinus tachycardia 04/03/2020   Alcohol abuse 07/04/2019   B12 deficiency 11/09/2018   Constipation 06/26/2018   Cramping of hands 05/28/2016   Hyperlipidemia 04/19/2016   Prediabetes 04/19/2016   Testosterone deficiency 04/19/2016   Vitamin D deficiency 04/19/2016   HTN (hypertension) 04/17/2016   Morbid obesity (HCC) 04/17/2016   Severe obstructive sleep apnea 04/17/2016   Left knee pain 04/17/2016   Paresthesia and pain of both upper extremities 04/17/2016    Past Surgical History:  Procedure Laterality Date   KNEE ARTHROSCOPY W/ ACL RECONSTRUCTION         Family History  Problem Relation Age of Onset   Heart disease Father    Diabetes Paternal Uncle    Colon cancer Neg Hx    Esophageal cancer Neg Hx     Social History   Tobacco Use   Smoking status:  Never   Smokeless tobacco: Current    Types: Snuff  Vaping Use   Vaping Use: Never used  Substance Use Topics   Alcohol use: Not Currently    Comment: weekly   Drug use: No    Home Medications Prior to Admission medications   Medication Sig Start Date End Date Taking? Authorizing Provider  diclofenac (VOLTAREN) 75 MG EC tablet Take 1 tablet (75 mg total) by mouth 2 (two) times daily. 08/25/21  Yes Cheron Schaumann K, PA-C  methocarbamol (ROBAXIN) 500 MG tablet Take 1 tablet (500 mg total) by mouth 4 (four) times daily. 08/25/21  Yes Elson Areas, PA-C  molnupiravir EUA 200 mg CAPS Take 4 capsules (800 mg total) by mouth 2 (two) times daily for 5 days. 08/25/21 08/30/21 Yes Elson Areas, PA-C  Alcohol Swabs (ALCOHOL PREP) PADS Use to clean skin and vials for testosterone injections. 11/12/17   Rodolph Bong, MD  AMBULATORY NON FORMULARY MEDICATION Please set CPAP to auto-titration from 10-20.  \  Adjust mask fit.  Send 30 day compliance report 30 days after change is made.   Send to [facility] 07/30/19   Rodolph Bong, MD  atorvastatin (LIPITOR) 80 MG tablet TAKE 1 TABLET BY MOUTH EVERY DAY 05/08/21   Everrett Coombe, DO  hydrOXYzine (VISTARIL) 25 MG capsule TAKE 1 CAPSULE BY MOUTH 3  TIMES DAILY AS NEEDED FOR ANXIETY. 08/16/21   Everrett Coombe, DO  losartan (COZAAR) 50 MG tablet Take 1 tablet (50 mg total) by mouth daily. 09/22/20   Everrett Coombe, DO  metFORMIN (GLUCOPHAGE XR) 500 MG 24 hr tablet Take 1 tablet (500 mg total) by mouth daily with breakfast. 06/11/21   Everrett Coombe, DO  NEEDLE, DISP, 18 G 18G X 1" MISC Inject testosterone once weekly. 11/21/20   Everrett Coombe, DO  Semaglutide,0.25 or 0.5MG /DOS, (OZEMPIC, 0.25 OR 0.5 MG/DOSE,) 2 MG/1.5ML SOPN Inject 0.5 mg into the skin once a week. Inject 0.25mg  weekly x1 month then increase to 0.5mg  weekly 06/11/21   Everrett Coombe, DO  Syringe/Needle, Disp, (SYRINGE 3CC/22GX1-1/2") 22G X 1-1/2" 3 ML MISC Inject testosterone once weekly. 11/21/20    Everrett Coombe, DO  testosterone cypionate (DEPOTESTOSTERONE CYPIONATE) 200 MG/ML injection INJECT 0.5 MLS (100 MG TOTAL) INTO THE MUSCLE ONCE A WEEK. 07/10/21   Everrett Coombe, DO  tiZANidine (ZANAFLEX) 4 MG tablet Take 1 tablet (4 mg total) by mouth every 6 (six) hours as needed for muscle spasms. 11/17/20   Everrett Coombe, DO    Allergies    Amlodipine  Review of Systems   Review of Systems  Musculoskeletal:  Positive for back pain.  All other systems reviewed and are negative.  Physical Exam Updated Vital Signs BP 128/85 (BP Location: Right Arm)   Pulse 91   Temp 99.8 F (37.7 C) (Oral)   Resp 18   Ht 6' (1.829 m)   Wt 129.3 kg   SpO2 99%   BMI 38.65 kg/m   Physical Exam Vitals reviewed.  HENT:     Nose: Nose normal.     Mouth/Throat:     Mouth: Mucous membranes are moist.  Cardiovascular:     Rate and Rhythm: Normal rate.  Pulmonary:     Effort: Pulmonary effort is normal.  Abdominal:     General: Abdomen is flat.  Musculoskeletal:        General: Normal range of motion.     Cervical back: Normal range of motion.  Skin:    General: Skin is warm.  Neurological:     General: No focal deficit present.     Mental Status: He is alert.  Psychiatric:        Mood and Affect: Mood normal.    ED Results / Procedures / Treatments   Labs (all labs ordered are listed, but only abnormal results are displayed) Labs Reviewed  RESP PANEL BY RT-PCR (FLU A&B, COVID) ARPGX2 - Abnormal; Notable for the following components:      Result Value   SARS Coronavirus 2 by RT PCR POSITIVE (*)    All other components within normal limits  BASIC METABOLIC PANEL - Abnormal; Notable for the following components:   Glucose, Bld 115 (*)    Creatinine, Ser 1.42 (*)    Calcium 8.7 (*)    All other components within normal limits  CBC WITH DIFFERENTIAL/PLATELET    EKG None  Radiology No results found.  Procedures Procedures   Medications Ordered in ED Medications - No data to  display  ED Course  I have reviewed the triage vital signs and the nursing notes.  Pertinent labs & imaging results that were available during my care of the patient were reviewed by me and considered in my medical decision making (see chart for details).    MDM Rules/Calculators/A&P  MDM:  Covid is positive.  Pt is diabetic.  Pt given rx for molnupiravir. Rx for robaxin and voltaren for back pain   Final Clinical Impression(s) / ED Diagnoses Final diagnoses:  COVID    Rx / DC Orders ED Discharge Orders          Ordered    molnupiravir EUA 200 mg CAPS  2 times daily        08/25/21 1708    methocarbamol (ROBAXIN) 500 MG tablet  4 times daily        08/25/21 1708    diclofenac (VOLTAREN) 75 MG EC tablet  2 times daily        08/25/21 1708          An After Visit Summary was printed and given to the patient.    Elson Areas, Cordelia Poche 08/25/21 1155    Alvira Monday, MD 08/27/21 1302

## 2021-08-27 ENCOUNTER — Telehealth: Payer: Self-pay | Admitting: General Practice

## 2021-08-27 NOTE — Telephone Encounter (Signed)
Transition Care Management Follow-up Telephone Call Date of discharge and from where: 08/25/21 from [facility] How have you been since you were released from the hospital? Doing ok Any questions or concerns? No  Items Reviewed: Did the pt receive and understand the discharge instructions provided? Yes  Medications obtained and verified? Yes  Other? No  Any new allergies since your discharge? No  Dietary orders reviewed? Yes Do you have support at home? Yes   Home Care and Equipment/Supplies: Were home health services ordered? no  Functional Questionnaire: (I = Independent and D = Dependent) ADLs: I  Bathing/Dressing- I  Meal Prep- I  Eating- I  Maintaining continence- I  Transferring/Ambulation- I  Managing Meds- I  Follow up appointments reviewed:  PCP Hospital f/u appt confirmed? No  Patient will call to schedule an appointment with Dr. Ashley Royalty. Specialist Hospital f/u appt confirmed? No   Are transportation arrangements needed? No  If their condition worsens, is the pt aware to call PCP or go to the Emergency Dept.? Yes Was the patient provided with contact information for the PCP's office or ED? Yes Was to pt encouraged to call back with questions or concerns? Yes

## 2021-09-04 ENCOUNTER — Ambulatory Visit (INDEPENDENT_AMBULATORY_CARE_PROVIDER_SITE_OTHER): Payer: Managed Care, Other (non HMO)

## 2021-09-04 ENCOUNTER — Other Ambulatory Visit: Payer: Self-pay

## 2021-09-04 ENCOUNTER — Encounter: Payer: Self-pay | Admitting: Family Medicine

## 2021-09-04 ENCOUNTER — Ambulatory Visit (INDEPENDENT_AMBULATORY_CARE_PROVIDER_SITE_OTHER): Payer: Managed Care, Other (non HMO) | Admitting: Family Medicine

## 2021-09-04 VITALS — BP 112/67 | HR 99 | Wt 283.0 lb

## 2021-09-04 DIAGNOSIS — Z23 Encounter for immunization: Secondary | ICD-10-CM

## 2021-09-04 DIAGNOSIS — M545 Low back pain, unspecified: Secondary | ICD-10-CM | POA: Diagnosis not present

## 2021-09-04 MED ORDER — PREDNISONE 50 MG PO TABS: ORAL_TABLET | ORAL | 0 refills | Status: DC

## 2021-09-04 NOTE — Progress Notes (Signed)
Dylan Munoz. - 45 y.o. male MRN [id number]  Date of birth: 1976-03-28  Subjective Chief Complaint  Patient presents with   Back Pain    Left flank pain without radiation x 1 month    HPI Dylan Munoz. is a 45 year old male here today with complaint of left-sided back pain.  He has had this for approximately 1 month.  We discussed this previously and referral was placed to physical therapy.  He has not had appointment yet but does have one scheduled soon.  He is not confident that this will help with his back pain.  Pain is nonradicular in nature.  It is worse with laying down.  It does improve some with walking around.  He denies any urinary symptoms associated with this.  ROS:  A comprehensive ROS was completed and negative except as noted per HPI  Allergies  Allergen Reactions   Amlodipine Other (See Comments)    Constipation    Past Medical History:  Diagnosis Date   High cholesterol    Hypertension    Morbid obesity (HCC) 04/17/2016   Severe obstructive sleep apnea 04/17/2016   AHI 14.9 with desats <88% for >90 mins on SNAP report 04/02/18. See scanned document.   Testosterone deficiency 04/19/2016   Vitamin D deficiency 04/19/2016    Past Surgical History:  Procedure Laterality Date   KNEE ARTHROSCOPY W/ ACL RECONSTRUCTION      Social History   Socioeconomic History   Marital status: Single    Spouse name: Not on file   Number of children: Not on file   Years of education: Not on file   Highest education level: Not on file  Occupational History   Not on file  Tobacco Use   Smoking status: Never   Smokeless tobacco: Current    Types: Snuff  Vaping Use   Vaping Use: Never used  Substance and Sexual Activity   Alcohol use: Not Currently    Comment: weekly   Drug use: No   Sexual activity: Yes  Other Topics Concern   Not on file  Social History Narrative   Not on file   Social Determinants of Health   Financial Resource Strain: Not on file  Food  Insecurity: Not on file  Transportation Needs: Not on file  Physical Activity: Not on file  Stress: Not on file  Social Connections: Not on file    Family History  Problem Relation Age of Onset   Heart disease Father    Diabetes Paternal Uncle    Colon cancer Neg Hx    Esophageal cancer Neg Hx     Health Maintenance  Topic Date Due   HIV Screening  Never done   Hepatitis C Screening  Never done   COVID-19 Vaccine (3 - Booster for Moderna series) 01/11/2021   COLONOSCOPY (Pts 45-5yrs Insurance coverage will need to be confirmed)  Never done   TETANUS/TDAP  04/17/2026   INFLUENZA VACCINE  Completed   HPV VACCINES  Aged Out     ----------------------------------------------------------------------------------------------------------------------------------------------------------------------------------------------------------------- Physical Exam BP 112/67   Pulse 99   Wt 283 lb (128.4 kg)   SpO2 97% Comment: on RA  BMI 38.38 kg/m   Physical Exam Constitutional:      Appearance: Normal appearance.  Cardiovascular:     Rate and Rhythm: Normal rate and regular rhythm.  Pulmonary:     Effort: Pulmonary effort is normal.     Breath sounds: Normal breath sounds.  Musculoskeletal:     Comments:  Range of motion of lumbar spine is normal.  He does have mild pain with flexion.  He has pain along the lower lumbar spine and SI joint area with FABER testing.  Neurological:     Mental Status: He is alert.  Psychiatric:        Mood and Affect: Mood normal.        Behavior: Behavior normal.    ------------------------------------------------------------------------------------------------------------------------------------------------------------------------------------------------------------------- Assessment and Plan  Acute left-sided low back pain without sciatica He has upcoming appointment with physical therapy which I encouraged him to keep.  He would like to have  x-rays completed today due to continued pain.  I did go ahead and order these.  Start course of prednisone 50 mg x 5 days.   Meds ordered this encounter  Medications   DISCONTD: predniSONE (DELTASONE) 50 MG tablet    Sig: Take 1 tab po daily x5 days    Dispense:  5 tablet    Refill:  0   predniSONE (DELTASONE) 50 MG tablet    Sig: Take 1 tab po daily x5 days    Dispense:  5 tablet    Refill:  0    No follow-ups on file.    This visit occurred during the SARS-CoV-2 public health emergency.  Safety protocols were in place, including screening questions prior to the visit, additional usage of staff PPE, and extensive cleaning of exam room while observing appropriate contact time as indicated for disinfecting solutions.

## 2021-09-04 NOTE — Assessment & Plan Note (Signed)
He has upcoming appointment with physical therapy which I encouraged him to keep.  He would like to have x-rays completed today due to continued pain.  I did go ahead and order these.  Start course of prednisone 50 mg x 5 days.

## 2021-09-05 ENCOUNTER — Encounter: Payer: Self-pay | Admitting: Family Medicine

## 2021-09-14 ENCOUNTER — Other Ambulatory Visit: Payer: Self-pay | Admitting: Family Medicine

## 2021-09-18 ENCOUNTER — Ambulatory Visit: Payer: Managed Care, Other (non HMO) | Admitting: Physical Therapy

## 2021-10-05 ENCOUNTER — Ambulatory Visit: Payer: Managed Care, Other (non HMO) | Admitting: Family Medicine

## 2021-10-17 ENCOUNTER — Other Ambulatory Visit: Payer: Self-pay | Admitting: Family Medicine

## 2021-10-17 DIAGNOSIS — E349 Endocrine disorder, unspecified: Secondary | ICD-10-CM

## 2021-11-14 ENCOUNTER — Telehealth: Payer: Self-pay

## 2021-11-14 NOTE — Telephone Encounter (Signed)
Medication: Semaglutide,0.25 or 0.5MG /DOS, (OZEMPIC, 0.25 OR 0.5 MG/DOSE,) 2 MG/1.5ML SOPN Prior authorization submitted via CoverMyMeds on 11/14/2021 PA submission pending

## 2021-11-14 NOTE — Telephone Encounter (Signed)
Medication:  Semaglutide,0.25 or 0.5MG /DOS, (OZEMPIC, 0.25 OR 0.5 MG/DOSE,) 2 MG/1.5ML SOPN Prior authorization determination received Drug is covered by current benefit plan. No further PA activity needed  Patient aware via: MyChart Pharmacy aware: Yes Provider aware via this encounter

## 2021-11-21 ENCOUNTER — Encounter: Payer: Self-pay | Admitting: Family Medicine

## 2021-11-22 ENCOUNTER — Other Ambulatory Visit: Payer: Self-pay | Admitting: Family Medicine

## 2021-11-22 MED ORDER — SEMAGLUTIDE (1 MG/DOSE) 4 MG/3ML ~~LOC~~ SOPN: 1.0000 mg | PEN_INJECTOR | SUBCUTANEOUS | 3 refills | Status: DC

## 2021-11-22 NOTE — Telephone Encounter (Signed)
Updated rx for 1mg /week sent.

## 2021-11-23 ENCOUNTER — Encounter: Payer: Self-pay | Admitting: Family Medicine

## 2021-12-07 ENCOUNTER — Ambulatory Visit: Payer: Managed Care, Other (non HMO) | Admitting: Family Medicine

## 2021-12-14 ENCOUNTER — Ambulatory Visit: Payer: Managed Care, Other (non HMO) | Admitting: Family Medicine

## 2021-12-15 ENCOUNTER — Other Ambulatory Visit: Payer: Self-pay | Admitting: Family Medicine

## 2021-12-31 ENCOUNTER — Encounter: Payer: Self-pay | Admitting: Family Medicine

## 2021-12-31 MED ORDER — SEMAGLUTIDE (1 MG/DOSE) 4 MG/3ML ~~LOC~~ SOPN: 1.0000 mg | PEN_INJECTOR | SUBCUTANEOUS | 3 refills | Status: DC

## 2022-02-19 ENCOUNTER — Other Ambulatory Visit: Payer: Self-pay | Admitting: Family Medicine

## 2022-03-12 ENCOUNTER — Other Ambulatory Visit: Payer: Self-pay | Admitting: Family Medicine

## 2022-03-16 ENCOUNTER — Other Ambulatory Visit: Payer: Self-pay | Admitting: Family Medicine

## 2022-03-19 NOTE — Telephone Encounter (Signed)
Pls contact pt to schedule 70-month follow-up appt. ?

## 2022-04-09 ENCOUNTER — Encounter: Payer: Self-pay | Admitting: Family Medicine

## 2022-04-09 DIAGNOSIS — I10 Essential (primary) hypertension: Secondary | ICD-10-CM

## 2022-04-09 DIAGNOSIS — R7303 Prediabetes: Secondary | ICD-10-CM

## 2022-04-09 DIAGNOSIS — E782 Mixed hyperlipidemia: Secondary | ICD-10-CM

## 2022-04-10 MED ORDER — OZEMPIC (2 MG/DOSE) 8 MG/3ML ~~LOC~~ SOPN: 2.0000 mg | PEN_INJECTOR | SUBCUTANEOUS | 0 refills | Status: DC

## 2022-04-13 ENCOUNTER — Other Ambulatory Visit: Payer: Self-pay | Admitting: Family Medicine

## 2022-04-16 ENCOUNTER — Other Ambulatory Visit: Payer: Self-pay | Admitting: Family Medicine

## 2022-04-16 DIAGNOSIS — E349 Endocrine disorder, unspecified: Secondary | ICD-10-CM

## 2022-04-17 NOTE — Telephone Encounter (Signed)
Patient has been scheduled for Dr Ashley Royalty next available slot on 05/16/22. AMUCK ?

## 2022-04-17 NOTE — Telephone Encounter (Signed)
Pls contact patient to schedule appointment and labs.  ?

## 2022-05-03 ENCOUNTER — Other Ambulatory Visit: Payer: Self-pay | Admitting: Family Medicine

## 2022-05-03 MED ORDER — ATORVASTATIN CALCIUM 80 MG PO TABS: 80.0000 mg | ORAL_TABLET | Freq: Every day | ORAL | 0 refills | Status: DC

## 2022-05-03 NOTE — Addendum Note (Signed)
Addended by: Stan Head on: 05/03/2022 08:24 AM   Modules accepted: Orders

## 2022-05-06 MED ORDER — OZEMPIC (2 MG/DOSE) 8 MG/3ML ~~LOC~~ SOPN: 2.0000 mg | PEN_INJECTOR | SUBCUTANEOUS | 0 refills | Status: DC

## 2022-05-06 NOTE — Addendum Note (Signed)
Addended by: Narda Rutherford on: 05/06/2022 10:42 AM   Modules accepted: Orders

## 2022-05-15 ENCOUNTER — Telehealth: Payer: Self-pay | Admitting: Family Medicine

## 2022-05-15 NOTE — Telephone Encounter (Signed)
Called patient at 1:47 on 5/31 & left vm to call back reschedule appt. Provider is out the office on 05/16/22-gh

## 2022-05-16 ENCOUNTER — Encounter: Payer: Self-pay | Admitting: Family Medicine

## 2022-05-16 ENCOUNTER — Telehealth: Payer: Self-pay | Admitting: Family Medicine

## 2022-05-16 ENCOUNTER — Ambulatory Visit: Payer: Managed Care, Other (non HMO) | Admitting: Family Medicine

## 2022-05-16 DIAGNOSIS — E349 Endocrine disorder, unspecified: Secondary | ICD-10-CM

## 2022-05-16 MED ORDER — TESTOSTERONE CYPIONATE 200 MG/ML IM SOLN: INTRAMUSCULAR | 1 refills | Status: DC

## 2022-05-16 NOTE — Telephone Encounter (Signed)
Patient came in office & rescheduled appointment that he had today for Dr Ashley Royalty for f/u on testosterone & labs, scheduled for next available on 06/28/22 as patient needed a Friday appt and this was the next Friday available. Paitent needed a refill of Testosterone sent to pharmacy below up until his appointment date. AMUCK   [id number] - [place], [place] - [address] AT [facility] [address] Phone:  [phone]  Fax:  [phone]

## 2022-05-17 LAB — COMPLETE METABOLIC PANEL WITH GFR
AG Ratio: 1.4 (calc) (ref 1.0–2.5)
ALT: 104 U/L — ABNORMAL HIGH (ref 9–46)
AST: 47 U/L — ABNORMAL HIGH (ref 10–40)
Albumin: 4.2 g/dL (ref 3.6–5.1)
Alkaline phosphatase (APISO): 77 U/L (ref 36–130)
BUN: 10 mg/dL (ref 7–25)
CO2: 28 mmol/L (ref 20–32)
Calcium: 9.6 mg/dL (ref 8.6–10.3)
Chloride: 103 mmol/L (ref 98–110)
Creat: 1.13 mg/dL (ref 0.60–1.29)
Globulin: 2.9 g/dL (calc) (ref 1.9–3.7)
Glucose, Bld: 93 mg/dL (ref 65–99)
Potassium: 4.8 mmol/L (ref 3.5–5.3)
Sodium: 143 mmol/L (ref 135–146)
Total Bilirubin: 0.8 mg/dL (ref 0.2–1.2)
Total Protein: 7.1 g/dL (ref 6.1–8.1)
eGFR: 81 mL/min/{1.73_m2} (ref >=60)

## 2022-05-17 LAB — LIPID PANEL W/REFLEX DIRECT LDL
Cholesterol: 146 mg/dL (ref <200)
HDL: 44 mg/dL (ref >=40)
LDL Cholesterol (Calc): 82 mg/dL (calc)
Non-HDL Cholesterol (Calc): 102 mg/dL (calc) (ref <130)
Total CHOL/HDL Ratio: 3.3 (calc) (ref <5.0)
Triglycerides: 102 mg/dL (ref <150)

## 2022-05-17 LAB — CBC WITH DIFFERENTIAL/PLATELET
Absolute Monocytes: 536 cells/uL (ref 200–950)
Basophils Absolute: 28 cells/uL (ref 0–200)
Basophils Relative: 0.6 %
Eosinophils Absolute: 89 cells/uL (ref 15–500)
Eosinophils Relative: 1.9 %
HCT: 48.4 % (ref 38.5–50.0)
Hemoglobin: 16.7 g/dL (ref 13.2–17.1)
Lymphs Abs: 1589 cells/uL (ref 850–3900)
MCH: 32.4 pg (ref 27.0–33.0)
MCHC: 34.5 g/dL (ref 32.0–36.0)
MCV: 94 fL (ref 80.0–100.0)
MPV: 9.8 fL (ref 7.5–12.5)
Monocytes Relative: 11.4 %
Neutro Abs: 2458 cells/uL (ref 1500–7800)
Neutrophils Relative %: 52.3 %
Platelets: 248 10*3/uL (ref 140–400)
RBC: 5.15 10*6/uL (ref 4.20–5.80)
RDW: 14.2 % (ref 11.0–15.0)
Total Lymphocyte: 33.8 %
WBC: 4.7 10*3/uL (ref 3.8–10.8)

## 2022-05-17 LAB — HEMOGLOBIN A1C
Hgb A1c MFr Bld: 5.7 % of total Hgb — ABNORMAL HIGH (ref <5.7)
Mean Plasma Glucose: 117 mg/dL
eAG (mmol/L): 6.5 mmol/L

## 2022-05-17 NOTE — Telephone Encounter (Signed)
Patient notified RX has been sent to pharmacy. AMUCK

## 2022-05-17 NOTE — Telephone Encounter (Signed)
Routing back as I do not have access to close this encounter. AMUCK

## 2022-05-21 LAB — HM DIABETES EYE EXAM

## 2022-05-23 ENCOUNTER — Encounter: Payer: Self-pay | Admitting: Family Medicine

## 2022-05-25 ENCOUNTER — Other Ambulatory Visit: Payer: Self-pay | Admitting: Family Medicine

## 2022-06-02 ENCOUNTER — Other Ambulatory Visit: Payer: Self-pay | Admitting: Family Medicine

## 2022-06-02 DIAGNOSIS — R7303 Prediabetes: Secondary | ICD-10-CM

## 2022-06-03 NOTE — Telephone Encounter (Signed)
Please re-attempt to contact the patient to schedule visit with Dr. Ashley Royalty. Sent 60 day refill to hold for schedule openings.

## 2022-06-04 NOTE — Telephone Encounter (Signed)
Patient has a f/u with PCP scheduled on 06/28/22. AMUCK

## 2022-06-28 ENCOUNTER — Ambulatory Visit: Payer: Managed Care, Other (non HMO) | Admitting: Family Medicine

## 2022-06-28 ENCOUNTER — Encounter: Payer: Self-pay | Admitting: Family Medicine

## 2022-06-28 VITALS — BP 133/88 | HR 93 | Ht 72.0 in | Wt 287.0 lb

## 2022-06-28 DIAGNOSIS — R7303 Prediabetes: Secondary | ICD-10-CM

## 2022-06-28 DIAGNOSIS — E349 Endocrine disorder, unspecified: Secondary | ICD-10-CM

## 2022-06-28 DIAGNOSIS — Z1211 Encounter for screening for malignant neoplasm of colon: Secondary | ICD-10-CM | POA: Diagnosis not present

## 2022-06-28 DIAGNOSIS — F1010 Alcohol abuse, uncomplicated: Secondary | ICD-10-CM | POA: Diagnosis not present

## 2022-06-28 DIAGNOSIS — I10 Essential (primary) hypertension: Secondary | ICD-10-CM

## 2022-06-28 DIAGNOSIS — E782 Mixed hyperlipidemia: Secondary | ICD-10-CM

## 2022-06-28 MED ORDER — TIRZEPATIDE 7.5 MG/0.5ML ~~LOC~~ SOAJ: 7.5000 mg | SUBCUTANEOUS | 0 refills | Status: DC

## 2022-06-28 MED ORDER — TIRZEPATIDE 10 MG/0.5ML ~~LOC~~ SOAJ: 10.0000 mg | SUBCUTANEOUS | 0 refills | Status: DC

## 2022-06-28 MED ORDER — TIRZEPATIDE 12.5 MG/0.5ML ~~LOC~~ SOAJ: 12.5000 mg | SUBCUTANEOUS | 1 refills | Status: DC

## 2022-06-28 NOTE — Assessment & Plan Note (Signed)
BP borderline on initial check today.  Repeat reading is slightly better.  Advised low sodium intake.  Continue losartan at current strength.

## 2022-06-28 NOTE — Assessment & Plan Note (Signed)
Lab Results  Component Value Date   LDLCALC 82 05/16/2022  Continue atorvastatin at current strength.

## 2022-06-28 NOTE — Assessment & Plan Note (Signed)
Very limited EtOH at this time.

## 2022-06-28 NOTE — Assessment & Plan Note (Signed)
Doing well with testosterone therapy.  Updating testosterone and PSA today.

## 2022-06-28 NOTE — Assessment & Plan Note (Signed)
" >>  ASSESSMENT AND PLAN FOR PREDIABETES WRITTEN ON 06/28/2022 10:29 AM BY Rahul Malinak, DO  Lab Results  Component Value Date   HGBA1C 5.7 (H) 05/16/2022  Doing well with metformin .  We'll try changing from ozempic  to mounjaro  for better glucose control and weight loss.  "

## 2022-06-28 NOTE — Progress Notes (Signed)
Dylan Munoz. - 46 y.o. male MRN [id number]  Date of birth: [birth year]  Subjective No chief complaint on file.   HPI Dylan Munoz. Is a 46 y.o. male here today for follow up visit.  He reports that he is doing pretty well.   BP is borderline today.  Had [place] food last night.  He is taking losartan regularly.  Denies symptoms related to HTN including chest pain, shortness of breath, palpitations, hospital  or vision changes.   A1c improved with Ozempic.  Weight loss has plateaued.  Interested in trying mounjaro.  No side effects with ozempic.   Doing well with testosterone at current strength.   ROS ROS:  A comprehensive ROS was completed and negative except as noted per HPI  Allergies  Allergen Reactions   Amlodipine Other (See Comments)    Constipation    Past Medical History:  Diagnosis Date   High cholesterol    Hypertension    Morbid obesity (HCC) 04/17/2016   Severe obstructive sleep apnea 04/17/2016   AHI 14.9 with desats <88% for >90 mins on SNAP report 04/02/18. See scanned document.   Testosterone deficiency 04/19/2016   Vitamin D deficiency 04/19/2016    Past Surgical History:  Procedure Laterality Date   KNEE ARTHROSCOPY W/ ACL RECONSTRUCTION      Social History   Socioeconomic History   Marital status: Single    Spouse name: Not on file   Number of children: Not on file   Years of education: Not on file   Highest education level: Not on file  Occupational History   Not on file  Tobacco Use   Smoking status: Never   Smokeless tobacco: Current    Types: Snuff  Vaping Use   Vaping Use: Never used  Substance and Sexual Activity   Alcohol use: Not Currently    Comment: weekly   Drug use: No   Sexual activity: Yes  Other Topics Concern   Not on file  Social History Narrative   Not on file   Social Determinants of Health   Financial Resource Strain: Not on file  Food Insecurity: Not on file  Transportation Needs: Not on file  Physical  Activity: Not on file  Stress: Not on file  Social Connections: Not on file    Family History  Problem Relation Age of Onset   Heart disease Father    Diabetes Paternal Uncle    Colon cancer Neg Hx    Esophageal cancer Neg Hx     Health Maintenance  Topic Date Due   Diabetic kidney evaluation - Urine ACR  06/28/2022 (Originally 04/02/1994)   COLONOSCOPY (Pts 45-56yrs Insurance coverage will need to be confirmed)  06/29/2023 (Originally 04/02/2021)   Hepatitis C Screening  06/29/2023 (Originally 04/02/1994)   HIV Screening  06/29/2023 (Originally 04/03/1991)   COVID-19 Vaccine (3 - Moderna series) 07/15/2023 (Originally 10/06/2020)   INFLUENZA VACCINE  07/16/2022   Diabetic kidney evaluation - GFR measurement  05/17/2023   TETANUS/TDAP  04/17/2026   HPV VACCINES  Aged Out     ----------------------------------------------------------------------------------------------------------------------------------------------------------------------------------------------------------------- Physical Exam BP 133/88 (BP Location: Left Arm, Patient Position: Sitting, Cuff Size: Large)   Pulse 93   Ht 6' (1.829 m)   Wt 287 lb (130.2 kg)   BMI 38.92 kg/m   Physical Exam Constitutional:      Appearance: Normal appearance.  Eyes:     General: No scleral icterus. Cardiovascular:     Rate and Rhythm: Normal rate and regular rhythm.  Musculoskeletal:     Cervical back: Neck supple.  Neurological:     Mental Status: He is alert.  Psychiatric:        Mood and Affect: Mood normal.        Behavior: Behavior normal.     ------------------------------------------------------------------------------------------------------------------------------------------------------------------------------------------------------------------- Assessment and Plan  Testosterone deficiency Doing well with testosterone therapy.  Updating testosterone and PSA today.   Prediabetes Lab Results  Component  Value Date   HGBA1C 5.7 (H) 05/16/2022  Doing well with metformin.  We'll try changing from ozempic to [facility] for better glucose control and weight loss.   Alcohol abuse Very limited EtOH at this time.   Hyperlipidemia Lab Results  Component Value Date   LDLCALC 82 05/16/2022  Continue atorvastatin at current strength.    HTN (hypertension) BP borderline on initial check today.  Repeat reading is slightly better.  Advised low sodium intake.  Continue losartan at current strength.   Meds ordered this encounter  Medications   tirzepatide (MOUNJARO) 7.5 MG/0.5ML Pen    Sig: Inject 7.5 mg into the skin once a week.    Dispense:  3 mL    Refill:  0   tirzepatide (MOUNJARO) 10 MG/0.5ML Pen    Sig: Inject 10 mg into the skin once a week.    Dispense:  3 mL    Refill:  0   tirzepatide (MOUNJARO) 12.5 MG/0.5ML Pen    Sig: Inject 12.5 mg into the skin once a week.    Dispense:  3 mL    Refill:  1    Return in about 6 months (around 12/29/2022) for HTN/Prediabetes.    This visit occurred during the SARS-CoV-2 public health emergency.  Safety protocols were in place, including screening questions prior to the visit, additional usage of staff PPE, and extensive cleaning of exam room while observing appropriate contact time as indicated for disinfecting solutions.

## 2022-06-28 NOTE — Assessment & Plan Note (Signed)
Lab Results  Component Value Date   HGBA1C 5.7 (H) 05/16/2022  Doing well with metformin.  We'll try changing from ozempic to [facility] for better glucose control and weight loss.

## 2022-06-29 LAB — PSA: PSA: 0.7 ng/mL

## 2022-06-29 LAB — TESTOSTERONE: Testosterone: 895 ng/dL — ABNORMAL HIGH (ref 250–827)

## 2022-07-15 ENCOUNTER — Other Ambulatory Visit: Payer: Self-pay | Admitting: Family Medicine

## 2022-07-15 DIAGNOSIS — E349 Endocrine disorder, unspecified: Secondary | ICD-10-CM

## 2022-07-21 ENCOUNTER — Other Ambulatory Visit: Payer: Self-pay | Admitting: Family Medicine

## 2022-07-22 ENCOUNTER — Encounter: Payer: Self-pay | Admitting: Family Medicine

## 2022-07-23 ENCOUNTER — Other Ambulatory Visit: Payer: Self-pay | Admitting: Family Medicine

## 2022-07-26 ENCOUNTER — Other Ambulatory Visit: Payer: Self-pay | Admitting: Family Medicine

## 2022-07-26 DIAGNOSIS — R7303 Prediabetes: Secondary | ICD-10-CM

## 2022-08-11 ENCOUNTER — Encounter: Payer: Self-pay | Admitting: Family Medicine

## 2022-08-16 ENCOUNTER — Other Ambulatory Visit: Payer: Self-pay | Admitting: Family Medicine

## 2022-08-16 DIAGNOSIS — E349 Endocrine disorder, unspecified: Secondary | ICD-10-CM

## 2022-08-26 ENCOUNTER — Encounter: Payer: Self-pay | Admitting: Family Medicine

## 2022-08-29 ENCOUNTER — Other Ambulatory Visit: Payer: Self-pay | Admitting: Family Medicine

## 2022-08-29 MED ORDER — MOUNJARO 7.5 MG/0.5ML ~~LOC~~ SOAJ: SUBCUTANEOUS | 0 refills | Status: DC

## 2022-09-10 ENCOUNTER — Encounter: Payer: Self-pay | Admitting: Family Medicine

## 2022-09-10 NOTE — Telephone Encounter (Signed)
They should have the 12.5 on file.  Is he wanting the 15mg ?

## 2022-09-12 MED ORDER — TIRZEPATIDE 12.5 MG/0.5ML ~~LOC~~ SOAJ: 12.5000 mg | SUBCUTANEOUS | 1 refills | Status: DC

## 2022-09-18 ENCOUNTER — Other Ambulatory Visit: Payer: Self-pay | Admitting: Family Medicine

## 2022-09-18 DIAGNOSIS — R7303 Prediabetes: Secondary | ICD-10-CM

## 2022-09-23 ENCOUNTER — Other Ambulatory Visit: Payer: Self-pay | Admitting: Family Medicine

## 2022-11-03 ENCOUNTER — Other Ambulatory Visit: Payer: Self-pay | Admitting: Family Medicine

## 2022-11-04 ENCOUNTER — Other Ambulatory Visit: Payer: Self-pay | Admitting: Family Medicine

## 2022-11-18 ENCOUNTER — Encounter: Payer: Self-pay | Admitting: Family Medicine

## 2022-11-18 DIAGNOSIS — E349 Endocrine disorder, unspecified: Secondary | ICD-10-CM

## 2022-11-19 MED ORDER — TESTOSTERONE CYPIONATE 200 MG/ML IM SOLN: INTRAMUSCULAR | 2 refills | Status: DC

## 2022-11-19 NOTE — Telephone Encounter (Signed)
Completed.

## 2022-12-06 ENCOUNTER — Other Ambulatory Visit: Payer: Self-pay | Admitting: Family Medicine

## 2022-12-11 ENCOUNTER — Other Ambulatory Visit: Payer: Self-pay

## 2022-12-11 ENCOUNTER — Emergency Department (HOSPITAL_BASED_OUTPATIENT_CLINIC_OR_DEPARTMENT_OTHER): Payer: Managed Care, Other (non HMO)

## 2022-12-11 ENCOUNTER — Emergency Department (HOSPITAL_BASED_OUTPATIENT_CLINIC_OR_DEPARTMENT_OTHER)
Admission: EM | Admit: 2022-12-11 | Discharge: 2022-12-11 | Disposition: A | Discharge status: Home / Self Care | Payer: Managed Care, Other (non HMO) | Attending: Emergency Medicine | Admitting: Emergency Medicine

## 2022-12-11 ENCOUNTER — Encounter (HOSPITAL_BASED_OUTPATIENT_CLINIC_OR_DEPARTMENT_OTHER): Payer: Self-pay | Admitting: Emergency Medicine

## 2022-12-11 DIAGNOSIS — Z20822 Contact with and (suspected) exposure to covid-19: Secondary | ICD-10-CM | POA: Diagnosis not present

## 2022-12-11 DIAGNOSIS — I10 Essential (primary) hypertension: Secondary | ICD-10-CM | POA: Insufficient documentation

## 2022-12-11 DIAGNOSIS — R197 Diarrhea, unspecified: Secondary | ICD-10-CM | POA: Diagnosis present

## 2022-12-11 DIAGNOSIS — F17200 Nicotine dependence, unspecified, uncomplicated: Secondary | ICD-10-CM | POA: Diagnosis not present

## 2022-12-11 DIAGNOSIS — J069 Acute upper respiratory infection, unspecified: Secondary | ICD-10-CM | POA: Diagnosis not present

## 2022-12-11 DIAGNOSIS — Z79899 Other long term (current) drug therapy: Secondary | ICD-10-CM | POA: Insufficient documentation

## 2022-12-11 LAB — CBC
HCT: 49.1 % (ref 39.0–52.0)
Hemoglobin: 17.1 g/dL — ABNORMAL HIGH (ref 13.0–17.0)
MCH: 31.8 pg (ref 26.0–34.0)
MCHC: 34.8 g/dL (ref 30.0–36.0)
MCV: 91.4 fL (ref 80.0–100.0)
Platelets: 239 10*3/uL (ref 150–400)
RBC: 5.37 MIL/uL (ref 4.22–5.81)
RDW: 12.6 % (ref 11.5–15.5)
WBC: 6 10*3/uL (ref 4.0–10.5)
nRBC: 0 % (ref 0.0–0.2)

## 2022-12-11 LAB — COMPREHENSIVE METABOLIC PANEL
ALT: 67 U/L — ABNORMAL HIGH (ref 0–44)
AST: 60 U/L — ABNORMAL HIGH (ref 15–41)
Albumin: 4.6 g/dL (ref 3.5–5.0)
Alkaline Phosphatase: 97 U/L (ref 38–126)
Anion gap: 10 (ref 5–15)
BUN: 14 mg/dL (ref 6–20)
CO2: 28 mmol/L (ref 22–32)
Calcium: 9.3 mg/dL (ref 8.9–10.3)
Chloride: 102 mmol/L (ref 98–111)
Creatinine, Ser: 1.27 mg/dL — ABNORMAL HIGH (ref 0.61–1.24)
GFR, Estimated: >60 mL/min (ref >60)
Glucose, Bld: 100 mg/dL — ABNORMAL HIGH (ref 70–99)
Potassium: 4.3 mmol/L (ref 3.5–5.1)
Sodium: 140 mmol/L (ref 135–145)
Total Bilirubin: 1.4 mg/dL — ABNORMAL HIGH (ref 0.3–1.2)
Total Protein: 8.6 g/dL — ABNORMAL HIGH (ref 6.5–8.1)

## 2022-12-11 LAB — RESP PANEL BY RT-PCR (RSV, FLU A&B, COVID)  RVPGX2
Influenza A by PCR: NEGATIVE
Influenza B by PCR: NEGATIVE
Resp Syncytial Virus by PCR: NEGATIVE
SARS Coronavirus 2 by RT PCR: NEGATIVE

## 2022-12-11 LAB — TROPONIN I (HIGH SENSITIVITY): Troponin I (High Sensitivity): 5 ng/L (ref <18)

## 2022-12-11 MED ORDER — ACETAMINOPHEN 325 MG PO TABS
650.0000 mg | ORAL_TABLET | Freq: Once | ORAL | Status: AC
  Administered 2022-12-11: 650 mg via ORAL
  Filled 2022-12-11: qty 2

## 2022-12-11 MED ORDER — SODIUM CHLORIDE 0.9 % IV BOLUS
1000.0000 mL | Freq: Once | INTRAVENOUS | Status: AC
  Administered 2022-12-11: 1000 mL via INTRAVENOUS

## 2022-12-11 MED ORDER — DICYCLOMINE HCL 10 MG PO CAPS
10.0000 mg | ORAL_CAPSULE | Freq: Once | ORAL | Status: AC
  Administered 2022-12-11: 10 mg via ORAL
  Filled 2022-12-11: qty 1

## 2022-12-11 MED ORDER — KETOROLAC TROMETHAMINE 15 MG/ML IJ SOLN
15.0000 mg | Freq: Once | INTRAMUSCULAR | Status: AC
  Administered 2022-12-11: 15 mg via INTRAVENOUS
  Filled 2022-12-11: qty 1

## 2022-12-11 NOTE — ED Provider Notes (Signed)
[facility] [place] EMERGENCY DEPARTMENT Provider Note   CSN: [id number] Arrival date & time: 12/11/22  0086     History  Chief Complaint  Patient presents with   Abdominal Pain    Dylan Munoz. is a 46 y.o. male with a past medical history significant for obesity, hypertension, hyperlipidemia, prediabetes without previous history of tobacco use, ACS, stroke who presents with concern for diarrhea, cough, chest pain that he describes as squeezing, worse with exertion, worse with cough.  Patient also has been having 3-4 episodes of diarrhea per day with stomach cramping, endorses his granddaughter had the flu around 1 week ago he has been spending fair amount of time with her.  Patient has not been vaccinated against the flu this year.   Abdominal Pain      Home Medications Prior to Admission medications   Medication Sig Start Date End Date Taking? Authorizing Provider  Alcohol Swabs (ALCOHOL PREP) PADS Use to clean skin and vials for testosterone injections. 11/12/17   Rodolph Bong, MD  AMBULATORY NON FORMULARY MEDICATION Please set CPAP to auto-titration from 10-20.  \  Adjust mask fit.  Send 30 day compliance report 30 days after change is made.   Send to [facility] 07/30/19   Rodolph Bong, MD  atorvastatin (LIPITOR) 80 MG tablet TAKE 1 TABLET BY MOUTH EVERY DAY 09/19/22   Everrett Coombe, DO  diclofenac (VOLTAREN) 75 MG EC tablet Take 1 tablet (75 mg total) by mouth 2 (two) times daily. Patient not taking: Reported on 06/28/2022 08/25/21   Elson Areas, PA-C  hydrOXYzine (VISTARIL) 25 MG capsule TAKE 1 CAPSULE BY MOUTH 3 TIMES DAILY AS NEEDED FOR ANXIETY. 12/10/22   Everrett Coombe, DO  losartan (COZAAR) 50 MG tablet TAKE 1 TABLET BY MOUTH EVERY DAY 11/05/22   Everrett Coombe, DO  metFORMIN (GLUCOPHAGE-XR) 500 MG 24 hr tablet TAKE 1 TABLET BY MOUTH EVERY DAY WITH BREAKFAST 09/19/22   Everrett Coombe, DO  methocarbamol (ROBAXIN) 500 MG tablet Take 1 tablet (500 mg total) by  mouth 4 (four) times daily. Patient not taking: Reported on 06/28/2022 08/25/21   Elson Areas, PA-C  NEEDLE, DISP, 18 G 18G X 1" MISC Inject testosterone once weekly. 11/21/20   Everrett Coombe, DO  predniSONE (DELTASONE) 50 MG tablet Take 1 tab po daily x5 days 09/04/21   Everrett Coombe, DO  Syringe/Needle, Disp, (SYRINGE 3CC/22GX1-1/2") 22G X 1-1/2" 3 ML MISC Inject testosterone once weekly. 11/21/20   Everrett Coombe, DO  testosterone cypionate (DEPOTESTOSTERONE CYPIONATE) 200 MG/ML injection Inject 0.5 mg into the muscle once a week. 11/19/22   Everrett Coombe, DO  tirzepatide [facility]) 12.5 MG/0.5ML Pen INJECT 12.5 MG SUBCUTANEOUSLY ONE TIME PER WEEK 11/04/22   Everrett Coombe, DO      Allergies    Amlodipine    Review of Systems   Review of Systems  Gastrointestinal:  Positive for abdominal pain.  All other systems reviewed and are negative.   Physical Exam Updated Vital Signs BP (!) 113/58   Pulse 100   Temp 98.7 F (37.1 C) (Oral)   Resp 16   Ht 6' (1.829 m)   Wt 127 kg   SpO2 100%   BMI 37.97 kg/m  Physical Exam Vitals and nursing note reviewed.  Constitutional:      General: He is not in acute distress.    Appearance: Normal appearance.  HENT:     Head: Normocephalic and atraumatic.  Eyes:     General:  Right eye: No discharge.        Left eye: No discharge.  Cardiovascular:     Rate and Rhythm: Regular rhythm. Tachycardia present.     Heart sounds: No murmur heard.    No friction rub. No gallop.     Comments: No significant tenderness palpation of the chest wall Pulmonary:     Effort: Pulmonary effort is normal.     Breath sounds: Normal breath sounds.  Abdominal:     General: Bowel sounds are normal.     Palpations: Abdomen is soft.  Skin:    General: Skin is warm and dry.     Capillary Refill: Capillary refill takes less than 2 seconds.  Neurological:     Mental Status: He is alert and oriented to person, place, and time.  Psychiatric:         Mood and Affect: Mood normal.        Behavior: Behavior normal.     ED Results / Procedures / Treatments   Labs (all labs ordered are listed, but only abnormal results are displayed) Labs Reviewed  CBC - Abnormal; Notable for the following components:      Result Value   Hemoglobin 17.1 (*)    All other components within normal limits  COMPREHENSIVE METABOLIC PANEL - Abnormal; Notable for the following components:   Glucose, Bld 100 (*)    Creatinine, Ser 1.27 (*)    Total Protein 8.6 (*)    AST 60 (*)    ALT 67 (*)    Total Bilirubin 1.4 (*)    All other components within normal limits  RESP PANEL BY RT-PCR (RSV, FLU A&B, COVID)  RVPGX2  TROPONIN I (HIGH SENSITIVITY)    EKG EKG Interpretation  Date/Time:  Wednesday December 11 2022 09:05:53 EST Ventricular Rate:  109 PR Interval:  151 QRS Duration: 80 QT Interval:  343 QTC Calculation: 462 R Axis:   70 Text Interpretation: Sinus tachycardia Confirmed by Benjiman Core [phone]) on 12/11/2022 10:28:13 AM  Radiology DG Chest 2 View  Result Date: 12/11/2022 CLINICAL DATA:  Chest pain EXAM: CHEST - 2 VIEW COMPARISON:  Chest 03/20/2020 FINDINGS: The heart size and mediastinal contours are within normal limits. Both lungs are clear. The visualized skeletal structures are unremarkable. IMPRESSION: No active cardiopulmonary disease. Electronically Signed   By: Marlan Palau M.D.   On: 12/11/2022 09:45    Procedures Procedures    Medications Ordered in ED Medications  acetaminophen (TYLENOL) tablet 650 mg (650 mg Oral Given 12/11/22 1000)  dicyclomine (BENTYL) capsule 10 mg (10 mg Oral Given 12/11/22 1000)  sodium chloride 0.9 % bolus 1,000 mL (1,000 mLs Intravenous New Bag/Given 12/11/22 1135)  ketorolac (TORADOL) 15 MG/ML injection 15 mg (15 mg Intravenous Given 12/11/22 1158)    ED Course/ Medical Decision Making/ A&P                           Medical Decision Making Amount and/or Complexity of Data  Reviewed Labs: ordered. Radiology: ordered.  Risk OTC drugs.   This patient is a 46 y.o. male  who presents to the ED for concern of epigastric pain, chest pain since Friday, diarrhea, denies nausea, vomiting shortness of breath, feels like he has a cold.  Patient with granddaughter with flu on Friday.  He denies chest pain worse with exertion, describes it as squeezing in nature, denies any radiation to arms, neck.  Denies previous history of ACS, strong  family history of ACS.  Differential diagnoses prior to evaluation: The emergent differential diagnosis includes, but is not limited to,  ACS, AAS, PE, Mallory-Weiss, Boerhaave's, Pneumonia, acute bronchitis, asthma or COPD exacerbation, anxiety, MSK pain or traumatic injury to the chest, acid reflux versus, .   This is not an exhaustive differential.   Past Medical History / Co-morbidities: Hypertension, obesity, struct of sleep apnea, previous history of alcohol abuse  Additional history: Chart reviewed. Pertinent results include: Reviewed outpatient family medicine visits, lab work, imaging from previous emergency department visits  Physical Exam: Physical exam performed. The pertinent findings include: Patient in no acute distress, with stable oxygen saturation on room air, he has had some intermittent tachycardia with normal rhythm, no tenderness to palpation of chest wall, with minimal tenderness palpation of abdomen in the epigastric region, no rebound, rigidity, guarding.  Overall patient is well-appearing, in no acute distress.  Lab Tests/Imaging studies: I personally interpreted labs/imaging and the pertinent results include: Troponin negative x 1 in context of nonexertional chest pain, chest pain ongoing for several days, no active chest pain at time my evaluation.  CBC notable for elevated hemoglobin, mildly elevated creatinine, as well as small elevations of AST, ALT, total bilirubin suggestive of some mild to moderate  dehydration.  Low clinical suspicion for acute gallbladder pathology, patient with no focal right upper quadrant tenderness, jaundice.  He does endorse slight increase in alcohol use over the holiday season which may be contributing to slight liver enzyme elevation..  I dependently interpreted plain film chest x-ray which shows no acute intrathoracic abnormality I agree with the radiologist interpretation.  Cardiac monitoring: EKG obtained and interpreted by my attending physician which shows: Sinus tachycardia   Medications: I ordered medication including Toradol, Tylenol, Bentyl, fluid bolus, patient responded well to treatment with improvement in clinical appearance of dehydration, and pain.  I have reviewed the patients home medicines and have made adjustments as needed.   Disposition: After consideration of the diagnostic results and the patients response to treatment, I feel that patient is stable for discharge at this time, encouraged increased fluids at home, rest, Imodium as needed for diarrhea, ibuprofen, Tylenol for pain.   emergency department workup does not suggest an emergent condition requiring admission or immediate intervention beyond what has been performed at this time. The plan is: Patient symptoms seem consistent with some chest pain related to cough, upper respiratory infection symptoms, I have low clinical suspicion for acute ACS based on patient's clinical presentation, lack of exertional chest pain and negative cardiac workup in the emergency department today. The patient is safe for discharge and has been instructed to return immediately for worsening symptoms, change in symptoms or any other concerns.  Final Clinical Impression(s) / ED Diagnoses Final diagnoses:  Diarrhea, unspecified type  Viral upper respiratory tract infection    Rx / DC Orders ED Discharge Orders     None         [place] 12/11/22 1321    Benjiman Core,  MD 12/12/22 [phone]

## 2022-12-11 NOTE — ED Triage Notes (Addendum)
Upper epigastric pain and chest  pain since last Friday making him have diarrhea he states , no n/v/sob  feels like he has a cold , grand child just dx with flu on Friday he has been with her

## 2022-12-11 NOTE — Discharge Instructions (Addendum)
You can use some over-the-counter Imodium as needed for diarrhea significant frequency, encourage plenty fluids, including Gatorade, Pedialyte.  If you have a viral infection which is my suspicion that will likely take a few days until you begin to feel better, you can use ibuprofen, Tylenol for fever, chills, body aches.

## 2022-12-12 ENCOUNTER — Telehealth: Payer: Self-pay | Admitting: General Practice

## 2022-12-12 NOTE — Telephone Encounter (Signed)
Transition Care Management Unsuccessful Follow-up Telephone Call  Date of discharge and from where:  12/11/22 from [facility]  Attempts:  1st Attempt  Reason for unsuccessful TCM follow-up call:  Left voice message

## 2022-12-17 NOTE — Telephone Encounter (Signed)
Transition Care Management Unsuccessful Follow-up Telephone Call  Date of discharge and from where:  12/11/22 from [facility]  Attempts:  2nd Attempt  Reason for unsuccessful TCM follow-up call:  No answer/busy

## 2022-12-20 NOTE — Telephone Encounter (Signed)
Transition Care Management Unsuccessful Follow-up Telephone Call  Date of discharge and from where:  12/11/22 from [facility]  Attempts:  3rd Attempt  Reason for unsuccessful TCM follow-up call:  No answer/busy

## 2022-12-25 ENCOUNTER — Encounter: Payer: Self-pay | Admitting: Family Medicine

## 2022-12-25 ENCOUNTER — Ambulatory Visit (INDEPENDENT_AMBULATORY_CARE_PROVIDER_SITE_OTHER): Payer: Managed Care, Other (non HMO) | Admitting: Family Medicine

## 2022-12-25 VITALS — BP 111/71 | HR 102 | Ht 72.0 in | Wt 288.0 lb

## 2022-12-25 DIAGNOSIS — F8081 Childhood onset fluency disorder: Secondary | ICD-10-CM | POA: Diagnosis not present

## 2022-12-25 DIAGNOSIS — R7989 Other specified abnormal findings of blood chemistry: Secondary | ICD-10-CM | POA: Diagnosis not present

## 2022-12-25 DIAGNOSIS — Z23 Encounter for immunization: Secondary | ICD-10-CM | POA: Diagnosis not present

## 2022-12-25 DIAGNOSIS — F411 Generalized anxiety disorder: Secondary | ICD-10-CM | POA: Insufficient documentation

## 2022-12-25 DIAGNOSIS — R7303 Prediabetes: Secondary | ICD-10-CM | POA: Diagnosis not present

## 2022-12-25 LAB — POCT GLYCOSYLATED HEMOGLOBIN (HGB A1C): HbA1c, POC (prediabetic range): 6.1 % (ref 5.7–6.4)

## 2022-12-25 MED ORDER — ESCITALOPRAM OXALATE 10 MG PO TABS: ORAL_TABLET | ORAL | 1 refills | Status: DC

## 2022-12-25 NOTE — Assessment & Plan Note (Signed)
He has done pretty well with Mounjaro.  Recommend continuation

## 2022-12-25 NOTE — Progress Notes (Signed)
Dylan Munoz. - 47 y.o. male MRN [id number]  Date of birth: [birth year]  Subjective Chief Complaint  Patient presents with   Diabetes    HPI Dylan Munoz is a 47 year old male here today for hospital follow-up.  Seen in the ED recently with complaint of chest pain and diarrhea.  Negative troponin and EKG changes in the ED.  Given fluids, Toradol and Bentyl.  He responded well to this.  He reports that the symptoms have resolved at this point.  He has not had any further chest pain or shortness of breath.  GI symptoms have resolved.  He is on [facility] for management of blood sugar.  A1c is slightly increased since last visit.  He is tolerating this well at current strength.  Reports increased anxiety.  He does have hydroxyzine as needed but does not feel like this is quite as effective as it once was.  He would be open to trying SSRI at this time.  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  He did see speech therapy as a child and would like to have referral back for continued therapy.  ROS:  A comprehensive ROS was completed and negative except as noted per HPI  Allergies  Allergen Reactions   Amlodipine Other (See Comments)    Constipation    Past Medical History:  Diagnosis Date   High cholesterol    Hypertension    Morbid obesity ([place]) 04/17/2016   Severe obstructive sleep apnea 04/17/2016   AHI 14.9 with desats <88% for >90 mins on SNAP report 04/02/18. See scanned document.   Testosterone deficiency 04/19/2016   Vitamin D deficiency 04/19/2016    Past Surgical History:  Procedure Laterality Date   KNEE ARTHROSCOPY W/ ACL RECONSTRUCTION      Social History   Socioeconomic History   Marital status: Single    Spouse name: Not on file   Number of children: Not on file   Years of education: Not on file   Highest education level: Not on file  Occupational History   Not on file  Tobacco Use   Smoking status:  Never   Smokeless tobacco: Current    Types: Snuff  Vaping Use   Vaping Use: Never used  Substance and Sexual Activity   Alcohol use: Not Currently    Comment: weekly   Drug use: No   Sexual activity: Yes  Other Topics Concern   Not on file  Social History Narrative   Not on file   Social Determinants of Health   Financial Resource Strain: Not on file  Food Insecurity: Not on file  Transportation Needs: Not on file  Physical Activity: Not on file  Stress: Not on file  Social Connections: Not on file    Family History  Problem Relation Age of Onset   Heart disease Father    Diabetes Paternal Uncle    Colon cancer Neg Hx    Esophageal cancer Neg Hx     Health Maintenance  Topic Date Due   COLONOSCOPY (Pts 45-52yrs Insurance coverage will need to be confirmed)  06/29/2023 (Originally 04/02/2021)   Hepatitis C Screening  06/29/2023 (Originally 04/02/1994)   HIV Screening  06/29/2023 (Originally 04/03/1991)   DTaP/Tdap/Td (2 - Td or Tdap) 04/17/2026   INFLUENZA VACCINE  Completed   COVID-19 Vaccine  Completed   HPV VACCINES  Aged Out     ----------------------------------------------------------------------------------------------------------------------------------------------------------------------------------------------------------------- Physical Exam BP 111/71 (BP Location: Left Arm,  Patient Position: Sitting, Cuff Size: Large)   Pulse (!) 102   Ht 6' (1.829 m)   Wt 288 lb (130.6 kg)   SpO2 98%   BMI 39.06 kg/m   Physical Exam Constitutional:      Appearance: Normal appearance.  HENT:     Head: Normocephalic and atraumatic.  Eyes:     General: No scleral icterus. Cardiovascular:     Rate and Rhythm: Normal rate and regular rhythm.  Pulmonary:     Effort: Pulmonary effort is normal.     Breath sounds: Normal breath sounds.  Musculoskeletal:     Cervical back: Neck supple.  Neurological:     Mental Status: He is alert.  Psychiatric:        Mood and  Affect: Mood normal.        Behavior: Behavior normal.     ------------------------------------------------------------------------------------------------------------------------------------------------------------------------------------------------------------------- Assessment and Plan  Prediabetes He has done pretty well with Mounjaro.  Recommend continuation  Generalized anxiety disorder He may continue hydroxyzine as needed.  Adding Lexapro 5 mg x 1 week with increase to 10 mg thereafter.  Follow-up in about 6 weeks.   Meds ordered this encounter  Medications   escitalopram (LEXAPRO) 10 MG tablet    Sig: Take 5mg  qhs x7 days then increase to 10mg .    Dispense:  30 tablet    Refill:  1    Return in about 6 weeks (around 02/05/2023) for Anxiety.    This visit occurred during the SARS-CoV-2 public health emergency.  Safety protocols were in place, including screening questions prior to the visit, additional usage of staff PPE, and extensive cleaning of exam room while observing appropriate contact time as indicated for disinfecting solutions.

## 2022-12-25 NOTE — Patient Instructions (Signed)
Let's try lexapro 5mg  at night for the first week then increase to a full tab.  See me again in 6 weeks for anxiety.

## 2022-12-25 NOTE — Assessment & Plan Note (Signed)
" >>  ASSESSMENT AND PLAN FOR PREDIABETES WRITTEN ON 12/25/2022 10:25 PM BY Kaysi Ourada, DO  He has done pretty well with Mounjaro .  Recommend continuation "

## 2022-12-25 NOTE — Assessment & Plan Note (Signed)
He may continue hydroxyzine as needed.  Adding Lexapro 5 mg x 1 week with increase to 10 mg thereafter.  Follow-up in about 6 weeks.

## 2022-12-26 LAB — CBC WITH DIFFERENTIAL/PLATELET
Absolute Monocytes: 567 cells/uL (ref 200–950)
Basophils Absolute: 42 cells/uL (ref 0–200)
Basophils Relative: 0.8 %
Eosinophils Absolute: 88 cells/uL (ref 15–500)
Eosinophils Relative: 1.7 %
HCT: 43.3 % (ref 38.5–50.0)
Hemoglobin: 15.1 g/dL (ref 13.2–17.1)
Lymphs Abs: 1674 cells/uL (ref 850–3900)
MCH: 33 pg (ref 27.0–33.0)
MCHC: 34.9 g/dL (ref 32.0–36.0)
MCV: 94.5 fL (ref 80.0–100.0)
MPV: 9.9 fL (ref 7.5–12.5)
Monocytes Relative: 10.9 %
Neutro Abs: 2829 cells/uL (ref 1500–7800)
Neutrophils Relative %: 54.4 %
Platelets: 258 10*3/uL (ref 140–400)
RBC: 4.58 10*6/uL (ref 4.20–5.80)
RDW: 14.1 % (ref 11.0–15.0)
Total Lymphocyte: 32.2 %
WBC: 5.2 10*3/uL (ref 3.8–10.8)

## 2022-12-26 LAB — COMPLETE METABOLIC PANEL WITH GFR
AG Ratio: 1.5 (calc) (ref 1.0–2.5)
ALT: 103 U/L — ABNORMAL HIGH (ref 9–46)
AST: 35 U/L (ref 10–40)
Albumin: 4.3 g/dL (ref 3.6–5.1)
Alkaline phosphatase (APISO): 79 U/L (ref 36–130)
BUN: 12 mg/dL (ref 7–25)
CO2: 29 mmol/L (ref 20–32)
Calcium: 9.4 mg/dL (ref 8.6–10.3)
Chloride: 106 mmol/L (ref 98–110)
Creat: 1.07 mg/dL (ref 0.60–1.29)
Globulin: 2.9 g/dL (calc) (ref 1.9–3.7)
Glucose, Bld: 94 mg/dL (ref 65–99)
Potassium: 4.2 mmol/L (ref 3.5–5.3)
Sodium: 142 mmol/L (ref 135–146)
Total Bilirubin: 0.6 mg/dL (ref 0.2–1.2)
Total Protein: 7.2 g/dL (ref 6.1–8.1)
eGFR: 87 mL/min/{1.73_m2} (ref >=60)

## 2023-01-03 ENCOUNTER — Ambulatory Visit: Payer: Managed Care, Other (non HMO) | Admitting: Family Medicine

## 2023-01-16 ENCOUNTER — Other Ambulatory Visit: Payer: Self-pay | Admitting: Family Medicine

## 2023-01-23 ENCOUNTER — Encounter: Payer: Self-pay | Admitting: Family Medicine

## 2023-01-24 ENCOUNTER — Other Ambulatory Visit: Payer: Self-pay | Admitting: Family Medicine

## 2023-01-24 MED ORDER — MOUNJARO 12.5 MG/0.5ML ~~LOC~~ SOAJ: SUBCUTANEOUS | 1 refills | Status: DC

## 2023-01-24 NOTE — Telephone Encounter (Signed)
Mounjaro refilled.  He should be at the 12.69m.

## 2023-01-30 ENCOUNTER — Telehealth: Payer: Self-pay

## 2023-01-30 NOTE — Telephone Encounter (Signed)
Initiated Prior authorization [id number] 12.5MG/0.5ML pen-injectors Via: Covermymeds Case/Key: [id number]  Status: approved as of 01/30/23 Reason:Coverage Start Date:12/31/2022;Coverage End Date:01/30/2024; Notified Pt via: Mychart

## 2023-02-05 ENCOUNTER — Encounter: Payer: Self-pay | Admitting: Family Medicine

## 2023-02-06 MED ORDER — MOUNJARO 12.5 MG/0.5ML ~~LOC~~ SOAJ: SUBCUTANEOUS | 1 refills | Status: DC

## 2023-02-12 ENCOUNTER — Other Ambulatory Visit: Payer: Self-pay | Admitting: Family Medicine

## 2023-02-14 ENCOUNTER — Ambulatory Visit: Payer: Managed Care, Other (non HMO) | Admitting: Family Medicine

## 2023-02-26 ENCOUNTER — Other Ambulatory Visit: Payer: Self-pay | Admitting: Family Medicine

## 2023-03-02 ENCOUNTER — Other Ambulatory Visit: Payer: Self-pay | Admitting: Family Medicine

## 2023-03-02 DIAGNOSIS — E349 Endocrine disorder, unspecified: Secondary | ICD-10-CM

## 2023-03-03 ENCOUNTER — Other Ambulatory Visit: Payer: Self-pay | Admitting: Family Medicine

## 2023-03-03 DIAGNOSIS — R7303 Prediabetes: Secondary | ICD-10-CM

## 2023-03-05 ENCOUNTER — Encounter: Payer: Self-pay | Admitting: Family Medicine

## 2023-03-07 ENCOUNTER — Other Ambulatory Visit: Payer: Self-pay | Admitting: Family Medicine

## 2023-03-26 ENCOUNTER — Other Ambulatory Visit: Payer: Self-pay | Admitting: Family Medicine

## 2023-03-26 DIAGNOSIS — E349 Endocrine disorder, unspecified: Secondary | ICD-10-CM

## 2023-03-29 ENCOUNTER — Other Ambulatory Visit: Payer: Self-pay | Admitting: Family Medicine

## 2023-03-31 NOTE — Telephone Encounter (Signed)
Pls contact the patient. Past due for 2 week follow-up in March. Sending 30 day medication refill. Thanks

## 2023-03-31 NOTE — Telephone Encounter (Signed)
Called patient, patient is scheduled for 04/18/23, thanks.

## 2023-04-15 ENCOUNTER — Encounter: Payer: Self-pay | Admitting: Family Medicine

## 2023-04-18 ENCOUNTER — Encounter: Payer: Self-pay | Admitting: Family Medicine

## 2023-04-18 ENCOUNTER — Ambulatory Visit (INDEPENDENT_AMBULATORY_CARE_PROVIDER_SITE_OTHER): Payer: Managed Care, Other (non HMO) | Admitting: Family Medicine

## 2023-04-18 VITALS — BP 130/80 | HR 102 | Ht 72.0 in | Wt 294.0 lb

## 2023-04-18 DIAGNOSIS — E349 Endocrine disorder, unspecified: Secondary | ICD-10-CM | POA: Diagnosis not present

## 2023-04-18 DIAGNOSIS — E611 Iron deficiency: Secondary | ICD-10-CM

## 2023-04-18 DIAGNOSIS — E538 Deficiency of other specified B group vitamins: Secondary | ICD-10-CM | POA: Diagnosis not present

## 2023-04-18 DIAGNOSIS — F411 Generalized anxiety disorder: Secondary | ICD-10-CM

## 2023-04-18 DIAGNOSIS — E1159 Type 2 diabetes mellitus with other circulatory complications: Secondary | ICD-10-CM

## 2023-04-18 DIAGNOSIS — I152 Hypertension secondary to endocrine disorders: Secondary | ICD-10-CM

## 2023-04-18 DIAGNOSIS — E559 Vitamin D deficiency, unspecified: Secondary | ICD-10-CM

## 2023-04-18 DIAGNOSIS — R7303 Prediabetes: Secondary | ICD-10-CM

## 2023-04-18 DIAGNOSIS — I10 Essential (primary) hypertension: Secondary | ICD-10-CM | POA: Diagnosis not present

## 2023-04-18 MED ORDER — ESCITALOPRAM OXALATE 10 MG PO TABS: ORAL_TABLET | ORAL | 1 refills | Status: DC

## 2023-04-18 MED ORDER — TIRZEPATIDE 15 MG/0.5ML ~~LOC~~ SOAJ: 15.0000 mg | SUBCUTANEOUS | 1 refills | Status: DC

## 2023-04-19 LAB — IRON,TIBC AND FERRITIN PANEL
%SAT: 36 % (calc) (ref 20–48)
Ferritin: 255 ng/mL (ref 38–380)
Iron: 120 ug/dL (ref 50–180)
TIBC: 330 mcg/dL (calc) (ref 250–425)

## 2023-04-19 LAB — COMPLETE METABOLIC PANEL WITH GFR
AG Ratio: 1.5 (calc) (ref 1.0–2.5)
ALT: 20 U/L (ref 9–46)
AST: 19 U/L (ref 10–40)
Albumin: 4.5 g/dL (ref 3.6–5.1)
Alkaline phosphatase (APISO): 74 U/L (ref 36–130)
BUN: 12 mg/dL (ref 7–25)
CO2: 28 mmol/L (ref 20–32)
Calcium: 9.4 mg/dL (ref 8.6–10.3)
Chloride: 101 mmol/L (ref 98–110)
Creat: 1.09 mg/dL (ref 0.60–1.29)
Globulin: 3 g/dL (calc) (ref 1.9–3.7)
Glucose, Bld: 91 mg/dL (ref 65–99)
Potassium: 5.1 mmol/L (ref 3.5–5.3)
Sodium: 139 mmol/L (ref 135–146)
Total Bilirubin: 0.9 mg/dL (ref 0.2–1.2)
Total Protein: 7.5 g/dL (ref 6.1–8.1)
eGFR: 84 mL/min/{1.73_m2} (ref >=60)

## 2023-04-19 LAB — TESTOSTERONE: Testosterone: 464 ng/dL (ref 250–827)

## 2023-04-19 LAB — HEMOGLOBIN A1C
Hgb A1c MFr Bld: 6 % of total Hgb — ABNORMAL HIGH (ref <5.7)
Mean Plasma Glucose: 126 mg/dL
eAG (mmol/L): 7 mmol/L

## 2023-04-19 LAB — VITAMIN D 25 HYDROXY (VIT D DEFICIENCY, FRACTURES): Vit D, 25-Hydroxy: 27 ng/mL — ABNORMAL LOW (ref 30–100)

## 2023-04-19 LAB — VITAMIN B12: Vitamin B-12: 221 pg/mL (ref 200–1100)

## 2023-04-20 ENCOUNTER — Encounter: Payer: Self-pay | Admitting: Family Medicine

## 2023-04-20 NOTE — Assessment & Plan Note (Signed)
He has done well with Mounjaro.  Will plan to continue this.  Increasing to 15 mg.

## 2023-04-20 NOTE — Progress Notes (Signed)
Dylan Munoz. - 47 y.o. male MRN [id number]  Date of birth: February 27, 1976  Subjective Chief Complaint  Patient presents with   Follow-up    HPI Dylan Munoz.  Is a 47 y.o. male here today for follow up visit.    He reports he is doing pretty well at this time.  He has been unable to find Mounjaro at 15 mg at his local pharmacy.  He would like to increase this due to increased appetite and some weight gain.  He does continue to stay active.  He feels like diet could be improved.  In addition to [facility] he is taking metformin.  Cholesterols remain well-controlled with atorvastatin and tolerating this as well  Blood pressure is managed with losartan.  Tolerating this well at current strength.  Denies chest pain, shortness of breath, palpitations, headaches or vision changes.  Feels good with testosterone at current strength.  Mood is stable with Lexapro at current strength.  ROS:  A comprehensive ROS was completed and negative except as noted per HPI  Allergies  Allergen Reactions   Amlodipine Other (See Comments)    Constipation    Past Medical History:  Diagnosis Date   High cholesterol    Hypertension    Morbid obesity (HCC) 04/17/2016   Severe obstructive sleep apnea 04/17/2016   AHI 14.9 with desats <88% for >90 mins on SNAP report 04/02/18. See scanned document.   Testosterone deficiency 04/19/2016   Vitamin D deficiency 04/19/2016    Past Surgical History:  Procedure Laterality Date   KNEE ARTHROSCOPY W/ ACL RECONSTRUCTION      Social History   Socioeconomic History   Marital status: Single    Spouse name: Not on file   Number of children: Not on file   Years of education: Not on file   Highest education level: Not on file  Occupational History   Not on file  Tobacco Use   Smoking status: Never   Smokeless tobacco: Current    Types: Snuff  Vaping Use   Vaping Use: Never used  Substance and Sexual Activity   Alcohol use: Not Currently    Comment: weekly    Drug use: No   Sexual activity: Yes  Other Topics Concern   Not on file  Social History Narrative   Not on file   Social Determinants of Health   Financial Resource Strain: Not on file  Food Insecurity: Not on file  Transportation Needs: Not on file  Physical Activity: Not on file  Stress: Not on file  Social Connections: Not on file    Family History  Problem Relation Age of Onset   Heart disease Father    Diabetes Paternal Uncle    Colon cancer Neg Hx    Esophageal cancer Neg Hx     Health Maintenance  Topic Date Due   Diabetic kidney evaluation - Urine ACR  Never done   COLONOSCOPY (Pts 45-58yrs Insurance coverage will need to be confirmed)  06/29/2023 (Originally 04/02/2021)   Hepatitis C Screening  06/29/2023 (Originally 04/02/1994)   HIV Screening  06/29/2023 (Originally 04/03/1991)   INFLUENZA VACCINE  07/17/2023   Diabetic kidney evaluation - eGFR measurement  04/17/2024   DTaP/Tdap/Td (2 - Td or Tdap) 04/17/2026   COVID-19 Vaccine  Completed   HPV VACCINES  Aged Out     ----------------------------------------------------------------------------------------------------------------------------------------------------------------------------------------------------------------- Physical Exam BP 130/80   Pulse (!) 102   Ht 6' (1.829 m)   Wt 294 lb (133.4 kg)   SpO2  99%   BMI 39.87 kg/m   Physical Exam Constitutional:      Appearance: Normal appearance.  HENT:     Head: Normocephalic and atraumatic.  Eyes:     General: No scleral icterus. Cardiovascular:     Rate and Rhythm: Normal rate and regular rhythm.  Pulmonary:     Effort: Pulmonary effort is normal.     Breath sounds: Normal breath sounds.  Neurological:     Mental Status: He is alert.  Psychiatric:        Mood and Affect: Mood normal.        Behavior: Behavior normal.      ------------------------------------------------------------------------------------------------------------------------------------------------------------------------------------------------------------------- Assessment and Plan  HTN (hypertension) Blood pressure is pretty well-controlled at this time.  He will continue blood pressures pretty well-controlled.  He will continue losartan at current strength.  B12 deficiency He has noted some fatigue.  Update B12 levels.  Testosterone deficiency Feels like he is doing pretty well with current testosterone strength and dosing.  Will plan to continue on update testosterone levels today.  Prediabetes He has done well with Mounjaro.  Will plan to continue this.  Increasing to 15 mg.  Generalized anxiety disorder Continue Lexapro at current strength.   Meds ordered this encounter  Medications   escitalopram (LEXAPRO) 10 MG tablet    Sig: Take 1 tab po daily    Dispense:  90 tablet    Refill:  1   tirzepatide (MOUNJARO) 15 MG/0.5ML Pen    Sig: Inject 15 mg into the skin once a week.    Dispense:  6 mL    Refill:  1    No follow-ups on file.    This visit occurred during the SARS-CoV-2 public health emergency.  Safety protocols were in place, including screening questions prior to the visit, additional usage of staff PPE, and extensive cleaning of exam room while observing appropriate contact time as indicated for disinfecting solutions.

## 2023-04-20 NOTE — Assessment & Plan Note (Signed)
Blood pressure is pretty well-controlled at this time.  He will continue blood pressures pretty well-controlled.  He will continue losartan at current strength.

## 2023-04-20 NOTE — Assessment & Plan Note (Signed)
Continue Lexapro at current strength. 

## 2023-04-20 NOTE — Assessment & Plan Note (Signed)
Feels like he is doing pretty well with current testosterone strength and dosing.  Will plan to continue on update testosterone levels today.

## 2023-04-20 NOTE — Assessment & Plan Note (Signed)
" >>  ASSESSMENT AND PLAN FOR PREDIABETES WRITTEN ON 04/20/2023  9:51 PM BY Tanairy Payeur, DO  He has done well with Mounjaro .  Will plan to continue this.  Increasing to 15 mg. "

## 2023-04-20 NOTE — Assessment & Plan Note (Signed)
He has noted some fatigue.  Update B12 levels.

## 2023-04-21 ENCOUNTER — Encounter: Payer: Self-pay | Admitting: Family Medicine

## 2023-04-21 MED ORDER — TIZANIDINE HCL 4 MG PO TABS: 4.0000 mg | ORAL_TABLET | Freq: Four times a day (QID) | ORAL | 3 refills | Status: DC | PRN

## 2023-04-21 NOTE — Telephone Encounter (Signed)
Update rx sent in.

## 2023-05-08 ENCOUNTER — Other Ambulatory Visit: Payer: Self-pay | Admitting: Family Medicine

## 2023-05-13 ENCOUNTER — Encounter: Payer: Self-pay | Admitting: Family Medicine

## 2023-05-13 DIAGNOSIS — E349 Endocrine disorder, unspecified: Secondary | ICD-10-CM

## 2023-06-20 ENCOUNTER — Other Ambulatory Visit: Payer: Self-pay | Admitting: Family Medicine

## 2023-06-20 DIAGNOSIS — E349 Endocrine disorder, unspecified: Secondary | ICD-10-CM

## 2023-07-19 ENCOUNTER — Other Ambulatory Visit: Payer: Self-pay | Admitting: Family Medicine

## 2023-07-22 ENCOUNTER — Other Ambulatory Visit: Payer: Self-pay | Admitting: Family Medicine

## 2023-08-21 ENCOUNTER — Other Ambulatory Visit: Payer: Self-pay | Admitting: Family Medicine

## 2023-09-23 ENCOUNTER — Other Ambulatory Visit: Payer: Self-pay | Admitting: Family Medicine

## 2023-09-30 ENCOUNTER — Encounter: Payer: Self-pay | Admitting: Family Medicine

## 2023-09-30 DIAGNOSIS — R7303 Prediabetes: Secondary | ICD-10-CM

## 2023-10-01 MED ORDER — ATORVASTATIN CALCIUM 80 MG PO TABS: 80.0000 mg | ORAL_TABLET | Freq: Every day | ORAL | 0 refills | Status: DC

## 2023-10-01 MED ORDER — METFORMIN HCL ER 500 MG PO TB24: 500.0000 mg | ORAL_TABLET | Freq: Every day | ORAL | 0 refills | Status: DC

## 2023-10-01 MED ORDER — LOSARTAN POTASSIUM 50 MG PO TABS: 50.0000 mg | ORAL_TABLET | Freq: Every day | ORAL | 0 refills | Status: DC

## 2023-11-16 ENCOUNTER — Other Ambulatory Visit: Payer: Self-pay | Admitting: Family Medicine

## 2023-11-16 ENCOUNTER — Encounter: Payer: Self-pay | Admitting: Family Medicine

## 2023-11-16 DIAGNOSIS — R7303 Prediabetes: Secondary | ICD-10-CM

## 2023-11-18 MED ORDER — ESCITALOPRAM OXALATE 10 MG PO TABS: ORAL_TABLET | ORAL | 0 refills | Status: DC

## 2023-11-28 ENCOUNTER — Other Ambulatory Visit: Payer: Self-pay

## 2023-11-28 NOTE — Telephone Encounter (Signed)
Hold medication refill until 12/20 appt.    Copied from CRM [id number]. Topic: Clinical - Medication Refill >> Nov 28, 2023 12:35 PM Clayton Bibles wrote: Most Recent Primary Care Visit:  Provider: Everrett Coombe  Department: [facility]  Visit Type: OFFICE VISIT  Date: 04/18/2023  Medication: He needs all of his medicines refills sent to his pharmacy -   Has the patient contacted their pharmacy? Yes (Agent: If no, request that the patient contact the pharmacy for the refill. If patient does not wish to contact the pharmacy document the reason why and proceed with request.) (Agent: If yes, when and what did the pharmacy advise?)  Is this the correct pharmacy for this prescription? Yes [facility] -- Please send Mounjaro 15 MG to [facility]  If no, delete pharmacy and type the correct one.  This is the patient's preferred pharmacy:  [id number] [place] - [address] [place] [address] Phone: [phone] Fax: [phone]  [id number] - [place], [place] - [address] [place] [address] Phone: [phone] Fax: [phone]  [id number] - [place], [place] - [address] AT [facility] [address] [place] [address] Phone: [phone] Fax: [phone]  [id number] - [place], [place] - [address] [place] [address] Phone: [phone] Fax: [phone]  [facility] - [place], [place] - [address] [place] [address] Phone: [phone] Fax: [phone]   Has the prescription been filled recently? No  Is the patient out of the medication? Yes - BP pill  Has the patient been seen for an appointment in the last year OR does the patient have an upcoming appointment? Yes  Can we respond through MyChart? Yes  Agent:  Please be advised that Rx refills may take up to 3 business days. We ask that you follow-up with your pharmacy.

## 2023-12-05 ENCOUNTER — Encounter: Payer: Self-pay | Admitting: Family Medicine

## 2023-12-05 ENCOUNTER — Ambulatory Visit (INDEPENDENT_AMBULATORY_CARE_PROVIDER_SITE_OTHER): Payer: BC Managed Care – PPO | Admitting: Family Medicine

## 2023-12-05 VITALS — BP 103/70 | HR 93 | Ht 72.0 in | Wt 272.0 lb

## 2023-12-05 DIAGNOSIS — R7303 Prediabetes: Secondary | ICD-10-CM

## 2023-12-05 DIAGNOSIS — F411 Generalized anxiety disorder: Secondary | ICD-10-CM | POA: Diagnosis not present

## 2023-12-05 DIAGNOSIS — E349 Endocrine disorder, unspecified: Secondary | ICD-10-CM

## 2023-12-05 DIAGNOSIS — I10 Essential (primary) hypertension: Secondary | ICD-10-CM

## 2023-12-05 DIAGNOSIS — Z7984 Long term (current) use of oral hypoglycemic drugs: Secondary | ICD-10-CM

## 2023-12-05 DIAGNOSIS — Z23 Encounter for immunization: Secondary | ICD-10-CM | POA: Diagnosis not present

## 2023-12-05 DIAGNOSIS — E1165 Type 2 diabetes mellitus with hyperglycemia: Secondary | ICD-10-CM | POA: Insufficient documentation

## 2023-12-05 LAB — POCT GLYCOSYLATED HEMOGLOBIN (HGB A1C): HbA1c, POC (prediabetic range): 5.9 % (ref 5.7–6.4)

## 2023-12-05 MED ORDER — HYDROXYZINE PAMOATE 25 MG PO CAPS: 25.0000 mg | ORAL_CAPSULE | Freq: Three times a day (TID) | ORAL | 3 refills | Status: DC | PRN

## 2023-12-05 MED ORDER — METFORMIN HCL ER 500 MG PO TB24: 500.0000 mg | ORAL_TABLET | Freq: Every day | ORAL | 2 refills | Status: AC

## 2023-12-05 MED ORDER — ATORVASTATIN CALCIUM 80 MG PO TABS: 80.0000 mg | ORAL_TABLET | Freq: Every day | ORAL | 1 refills | Status: DC

## 2023-12-05 MED ORDER — MOUNJARO 15 MG/0.5ML ~~LOC~~ SOAJ: 15.0000 mg | SUBCUTANEOUS | 1 refills | Status: DC

## 2023-12-05 MED ORDER — TESTOSTERONE CYPIONATE 200 MG/ML IM SOLN: INTRAMUSCULAR | 3 refills | Status: DC

## 2023-12-05 MED ORDER — LOSARTAN POTASSIUM 50 MG PO TABS: 50.0000 mg | ORAL_TABLET | Freq: Every day | ORAL | 2 refills | Status: AC

## 2023-12-05 MED ORDER — ESCITALOPRAM OXALATE 10 MG PO TABS: ORAL_TABLET | ORAL | 1 refills | Status: DC

## 2023-12-05 NOTE — Addendum Note (Signed)
Addended by: Ardyth Man on: 12/05/2023 12:09 PM   Modules accepted: Orders

## 2023-12-05 NOTE — Progress Notes (Signed)
Dylan Munoz. - 47 y.o. male MRN [id number]  Date of birth: 04-Sep-1976  Subjective Chief Complaint  Patient presents with   Annual Exam    HPI Dylan Munoz. Is a 47 y.o. male here today for follow up visit.   He reports that he is doing pretty well.  He is stretching out medications due to being out of refills.   He has done well with mounjaro for management of blood sugars.  He has had significant weight loss with this as well.  Weight is down about 20+ lbs since May.  He is exercising regularly and working on dietary changes. Tolerating atorvastatin well for associated HLD.   He feels pretty good with current testosterone supplementation.   Mood and anxeity remains stable with lexapro daily and vistaril as need.   ROS:  A comprehensive ROS was completed and negative except as noted per HPI  Allergies  Allergen Reactions   Amlodipine Other (See Comments)    Constipation    Past Medical History:  Diagnosis Date   High cholesterol    Hypertension    Morbid obesity ([facility]) 04/17/2016   Severe obstructive sleep apnea 04/17/2016   AHI 14.9 with desats <88% for >90 mins on SNAP report 04/02/18. See scanned document.   Testosterone deficiency 04/19/2016   Vitamin D deficiency 04/19/2016    Past Surgical History:  Procedure Laterality Date   KNEE ARTHROSCOPY W/ ACL RECONSTRUCTION      Social History   Socioeconomic History   Marital status: Single    Spouse name: Not on file   Number of children: Not on file   Years of education: Not on file   Highest education level: Not on file  Occupational History   Not on file  Tobacco Use   Smoking status: Never   Smokeless tobacco: Current    Types: Snuff  Vaping Use   Vaping status: Never Used  Substance and Sexual Activity   Alcohol use: Not Currently    Comment: weekly   Drug use: No   Sexual activity: Yes  Other Topics Concern   Not on file  Social History Narrative   Not on file   Social Drivers of Health    Financial Resource Strain: Not on file  Food Insecurity: Not on file  Transportation Needs: Not on file  Physical Activity: Not on file  Stress: Not on file  Social Connections: Not on file    Family History  Problem Relation Age of Onset   Heart disease Father    Diabetes Paternal Uncle    Colon cancer Neg Hx    Esophageal cancer Neg Hx     Health Maintenance  Topic Date Due   FOOT EXAM  Never done   HIV Screening  Never done   Diabetic kidney evaluation - Urine ACR  Never done   Hepatitis C Screening  Never done   Colonoscopy  Never done   OPHTHALMOLOGY EXAM  05/22/2023   INFLUENZA VACCINE  07/17/2023   COVID-19 Vaccine (4 - 2024-25 season) 08/17/2023   HEMOGLOBIN A1C  10/19/2023   Diabetic kidney evaluation - eGFR measurement  04/17/2024   DTaP/Tdap/Td (2 - Td or Tdap) 04/17/2026   HPV VACCINES  Aged Out     ----------------------------------------------------------------------------------------------------------------------------------------------------------------------------------------------------------------- Physical Exam BP 103/70 (BP Location: Left Arm, Patient Position: Sitting, Cuff Size: Large)   Pulse 93   Ht 6' (1.829 m)   Wt 272 lb (123.4 kg)   SpO2 100%   BMI 36.89 kg/m  Physical Exam Constitutional:      Appearance: Normal appearance.  HENT:     Head: Normocephalic and atraumatic.  Eyes:     General: No scleral icterus. Cardiovascular:     Rate and Rhythm: Normal rate and regular rhythm.  Pulmonary:     Effort: Pulmonary effort is normal.     Breath sounds: Normal breath sounds.  Musculoskeletal:     Cervical back: Neck supple.  Neurological:     Mental Status: He is alert.  Psychiatric:        Mood and Affect: Mood normal.        Behavior: Behavior normal.      ------------------------------------------------------------------------------------------------------------------------------------------------------------------------------------------------------------------- Assessment and Plan  Type 2 diabetes mellitus with hyperglycemia (HCC) Blood sugars are well controlled with mounjaro.  Recommend continuation at 15mg  daily.   HTN (hypertension) Blood pressure is pretty well-controlled at this time.  He will continue blood pressures pretty well-controlled.  He will continue losartan at current strength.  Generalized anxiety disorder Continue Lexapro at current strength.  Testosterone deficiency Feels like he is doing pretty well with current testosterone strength and dosing.     No orders of the defined types were placed in this encounter.   No follow-ups on file.    This visit occurred during the SARS-CoV-2 public health emergency.  Safety protocols were in place, including screening questions prior to the visit, additional usage of staff PPE, and extensive cleaning of exam room while observing appropriate contact time as indicated for disinfecting solutions.

## 2023-12-05 NOTE — Addendum Note (Signed)
Addended by: Ardyth Man on: 12/05/2023 03:25 PM   Modules accepted: Orders

## 2023-12-05 NOTE — Assessment & Plan Note (Signed)
Blood pressure is pretty well-controlled at this time.  He will continue blood pressures pretty well-controlled.  He will continue losartan at current strength.

## 2023-12-05 NOTE — Assessment & Plan Note (Signed)
Continue Lexapro at current strength. 

## 2023-12-05 NOTE — Assessment & Plan Note (Signed)
Blood sugars are well controlled with mounjaro.  Recommend continuation at 15mg  daily.

## 2023-12-05 NOTE — Assessment & Plan Note (Addendum)
Feels like he is doing pretty well with current testosterone strength and dosing.

## 2023-12-11 ENCOUNTER — Other Ambulatory Visit: Payer: Self-pay

## 2023-12-11 ENCOUNTER — Encounter: Payer: Self-pay | Admitting: Family Medicine

## 2023-12-11 ENCOUNTER — Encounter (HOSPITAL_BASED_OUTPATIENT_CLINIC_OR_DEPARTMENT_OTHER): Payer: Self-pay

## 2023-12-11 ENCOUNTER — Emergency Department (HOSPITAL_BASED_OUTPATIENT_CLINIC_OR_DEPARTMENT_OTHER)
Admission: EM | Admit: 2023-12-11 | Discharge: 2023-12-11 | Disposition: A | Discharge status: Home / Self Care | Payer: BC Managed Care – PPO | Attending: Emergency Medicine | Admitting: Emergency Medicine

## 2023-12-11 DIAGNOSIS — Z20822 Contact with and (suspected) exposure to covid-19: Secondary | ICD-10-CM | POA: Insufficient documentation

## 2023-12-11 DIAGNOSIS — E119 Type 2 diabetes mellitus without complications: Secondary | ICD-10-CM | POA: Insufficient documentation

## 2023-12-11 DIAGNOSIS — Z7984 Long term (current) use of oral hypoglycemic drugs: Secondary | ICD-10-CM | POA: Diagnosis not present

## 2023-12-11 DIAGNOSIS — R42 Dizziness and giddiness: Secondary | ICD-10-CM | POA: Insufficient documentation

## 2023-12-11 DIAGNOSIS — I10 Essential (primary) hypertension: Secondary | ICD-10-CM | POA: Diagnosis not present

## 2023-12-11 DIAGNOSIS — Z79899 Other long term (current) drug therapy: Secondary | ICD-10-CM | POA: Diagnosis not present

## 2023-12-11 LAB — URINALYSIS, ROUTINE W REFLEX MICROSCOPIC
Bilirubin Urine: NEGATIVE
Glucose, UA: NEGATIVE mg/dL
Hgb urine dipstick: NEGATIVE
Ketones, ur: NEGATIVE mg/dL
Leukocytes,Ua: NEGATIVE
Nitrite: NEGATIVE
Protein, ur: NEGATIVE mg/dL
Specific Gravity, Urine: >=1.03 (ref 1.005–1.030)
pH: 5.5 (ref 5.0–8.0)

## 2023-12-11 LAB — CBC
HCT: 39 % (ref 39.0–52.0)
Hemoglobin: 13.8 g/dL (ref 13.0–17.0)
MCH: 32.5 pg (ref 26.0–34.0)
MCHC: 35.4 g/dL (ref 30.0–36.0)
MCV: 92 fL (ref 80.0–100.0)
Platelets: 199 10*3/uL (ref 150–400)
RBC: 4.24 MIL/uL (ref 4.22–5.81)
RDW: 13.8 % (ref 11.5–15.5)
WBC: 4.3 10*3/uL (ref 4.0–10.5)
nRBC: 0 % (ref 0.0–0.2)

## 2023-12-11 LAB — BASIC METABOLIC PANEL
Anion gap: 10 (ref 5–15)
BUN: 18 mg/dL (ref 6–20)
CO2: 26 mmol/L (ref 22–32)
Calcium: 8.5 mg/dL — ABNORMAL LOW (ref 8.9–10.3)
Chloride: 98 mmol/L (ref 98–111)
Creatinine, Ser: 1.17 mg/dL (ref 0.61–1.24)
GFR, Estimated: >60 mL/min (ref >60)
Glucose, Bld: 167 mg/dL — ABNORMAL HIGH (ref 70–99)
Potassium: 4.3 mmol/L (ref 3.5–5.1)
Sodium: 134 mmol/L — ABNORMAL LOW (ref 135–145)

## 2023-12-11 LAB — CBG MONITORING, ED: Glucose-Capillary: 156 mg/dL — ABNORMAL HIGH (ref 70–99)

## 2023-12-11 LAB — SARS CORONAVIRUS 2 BY RT PCR: SARS Coronavirus 2 by RT PCR: NEGATIVE

## 2023-12-11 MED ORDER — SODIUM CHLORIDE 0.9 % IV BOLUS
1000.0000 mL | Freq: Once | INTRAVENOUS | Status: AC
  Administered 2023-12-11: 1000 mL via INTRAVENOUS

## 2023-12-11 NOTE — ED Triage Notes (Addendum)
Pt reports dizziness since Tuesday and got up at 0300 for work and felt very dizzy. Pt endorses diarrhea and increased urinary frequency. Pre-diabetic on Metformin. Pt reports "it feels like I am in a dream" and does not feel right.

## 2023-12-11 NOTE — ED Provider Notes (Signed)
[place] [facility] Provider Note   CSN: [id number] Arrival date & time: 12/11/23  0430     History  Chief Complaint  Patient presents with   Dizziness    Daiven Winterrowd. is a 47 y.o. male.  Patient is a 47 year old male with past medical history of hypertension, obstructive sleep apnea, diabetes, hyperlipidemia.  Patient presenting today for evaluation of dizziness.  He has experienced episodes of dizziness over the past 3 days, then became worse this morning.  He describes a lightheaded sensation when he bends over, then stands back up.  He feels as though he is going to pass out.  He denies any headache, weakness, or numbness.  He denies any chest pain or palpitations.  The history is provided by the patient.       Home Medications Prior to Admission medications   Medication Sig Start Date End Date Taking? Authorizing Provider  Alcohol Swabs (ALCOHOL PREP) PADS Use to clean skin and vials for testosterone injections. 11/12/17   Rodolph Bong, MD  AMBULATORY NON FORMULARY MEDICATION Please set CPAP to auto-titration from 10-20.  \  Adjust mask fit.  Send 30 day compliance report 30 days after change is made.   Send to [facility] 07/30/19   Rodolph Bong, MD  atorvastatin (LIPITOR) 80 MG tablet Take 1 tablet (80 mg total) by mouth daily. 12/05/23   Everrett Coombe, DO  escitalopram (LEXAPRO) 10 MG tablet TAKE 1 TABLET BY MOUTH EVERY DAY. Needs appointment. 12/05/23   Everrett Coombe, DO  hydrOXYzine (VISTARIL) 25 MG capsule Take 1 capsule (25 mg total) by mouth every 8 (eight) hours as needed for anxiety. 12/05/23   Everrett Coombe, DO  losartan (COZAAR) 50 MG tablet Take 1 tablet (50 mg total) by mouth daily. 12/05/23   Everrett Coombe, DO  metFORMIN (GLUCOPHAGE-XR) 500 MG 24 hr tablet Take 1 tablet (500 mg total) by mouth daily with breakfast. 12/05/23   Everrett Coombe, DO  NEEDLE, DISP, 18 G 18G X 1" MISC Inject testosterone once weekly.  11/21/20   Everrett Coombe, DO  Syringe/Needle, Disp, (SYRINGE 3CC/22GX1-1/2") 22G X 1-1/2" 3 ML MISC Inject testosterone once weekly. 11/21/20   Everrett Coombe, DO  testosterone cypionate (DEPOTESTOSTERONE CYPIONATE) 200 MG/ML injection INJECT 0.5 ML INTO THE MUSCLE ONCE A WEEK. 12/05/23   Everrett Coombe, DO  tirzepatide [facility]) 12.5 MG/0.5ML Pen INJECT 12.5 MG SUBCUTANEOUSLY ONE TIME PER WEEK 03/05/23   Everrett Coombe, DO  tirzepatide [facility]) 15 MG/0.5ML Pen Inject 15 mg into the skin once a week. 12/05/23   Everrett Coombe, DO  tiZANidine (ZANAFLEX) 4 MG tablet Take 1 tablet (4 mg total) by mouth every 6 (six) hours as needed for muscle spasms. 04/21/23   Everrett Coombe, DO      Allergies    Amlodipine    Review of Systems   Review of Systems  All other systems reviewed and are negative.   Physical Exam Updated Vital Signs BP (!) 134/90 (BP Location: Right Arm)   Pulse (!) 103   Temp 97.6 F (36.4 C) (Oral)   Resp 18   Ht 6' (1.829 m)   Wt 123 kg   SpO2 100%   BMI 36.78 kg/m  Physical Exam Vitals and nursing note reviewed.  Constitutional:      General: He is not in acute distress.    Appearance: He is well-developed. He is not diaphoretic.  HENT:     Head: Normocephalic and atraumatic.  Mouth/Throat:     Mouth: Mucous membranes are moist.  Eyes:     Extraocular Movements: Extraocular movements intact.     Pupils: Pupils are equal, round, and reactive to light.  Cardiovascular:     Rate and Rhythm: Normal rate and regular rhythm.     Heart sounds: No murmur heard.    No friction rub.  Pulmonary:     Effort: Pulmonary effort is normal. No respiratory distress.     Breath sounds: Normal breath sounds. No wheezing or rales.  Abdominal:     General: Bowel sounds are normal. There is no distension.     Palpations: Abdomen is soft.     Tenderness: There is no abdominal tenderness.  Musculoskeletal:        General: Normal range of motion.     Cervical back: Normal  range of motion and neck supple.  Skin:    General: Skin is warm and dry.  Neurological:     General: No focal deficit present.     Mental Status: He is alert and oriented to person, place, and time.     Cranial Nerves: No cranial nerve deficit.     Coordination: Coordination normal.     ED Results / Procedures / Treatments   Labs (all labs ordered are listed, but only abnormal results are displayed) Labs Reviewed  CBG MONITORING, ED - Abnormal; Notable for the following components:      Result Value   Glucose-Capillary 156 (*)    All other components within normal limits  SARS CORONAVIRUS 2 BY RT PCR  BASIC METABOLIC PANEL  CBC  URINALYSIS, ROUTINE W REFLEX MICROSCOPIC    EKG EKG Interpretation Date/Time:  Thursday December 11 2023 04:46:17 EST Ventricular Rate:  94 PR Interval:  156 QRS Duration:  84 QT Interval:  336 QTC Calculation: 421 R Axis:   63  Text Interpretation: Sinus rhythm Nonspecific repol abnormality, inferior leads Confirmed by Geoffery Lyons ([id number]) on 12/11/2023 4:53:35 AM  Radiology No results found.  Procedures Procedures    Medications Ordered in ED Medications - No data to display  ED Course/ Medical Decision Making/ A&P  Patient is a 47 year old male presenting with complaints of dizziness as described in the HPI.  Patient arrives with stable vital signs and is afebrile.  Physical examination is unremarkable and he is neurologically intact.  Laboratory studies obtained including CBC, metabolic panel, both of which are unremarkable.  Also obtained a urine sample showing no evidence for infection or other abnormality.  COVID swab is negative.  At this point, cause of his dizziness is somewhat unclear, but nothing appears emergent.  He describes a near syncopal sensation as opposed to a spinning sensation.  This does not sound like vertigo.  Patient seems appropriate for discharge.  He does report having some diarrhea recently, perhaps he is  mildly dehydrated.  He was given 1 L of normal saline and seems to be doing well.    Final Clinical Impression(s) / ED Diagnoses Final diagnoses:  None    Rx / DC Orders ED Discharge Orders     None         Geoffery Lyons, MD 12/11/23 [phone]

## 2023-12-11 NOTE — Discharge Instructions (Signed)
 Drink plenty of fluids and get plenty of rest.  Return to the ER if your symptoms significantly worsen or change.

## 2023-12-24 ENCOUNTER — Encounter: Payer: Self-pay | Admitting: Family Medicine

## 2023-12-31 ENCOUNTER — Telehealth: Payer: Self-pay

## 2023-12-31 NOTE — Telephone Encounter (Signed)
 Copied from CRM [id number]. Topic: Clinical - Medication Question >> Dec 31, 2023  9:16 AM Hilton Lucky wrote: Reason for CRM: Patient is calling to request that a Prior Authorization for his testosterone  be submitted ASAP, as he has been out of his medicine for a month.

## 2024-02-01 ENCOUNTER — Other Ambulatory Visit: Payer: Self-pay

## 2024-02-01 ENCOUNTER — Emergency Department (HOSPITAL_BASED_OUTPATIENT_CLINIC_OR_DEPARTMENT_OTHER)
Admission: EM | Admit: 2024-02-01 | Discharge: 2024-02-01 | Disposition: A | Discharge status: Home / Self Care | Payer: BC Managed Care – PPO | Attending: Emergency Medicine | Admitting: Emergency Medicine

## 2024-02-01 ENCOUNTER — Encounter (HOSPITAL_BASED_OUTPATIENT_CLINIC_OR_DEPARTMENT_OTHER): Payer: Self-pay | Admitting: Urology

## 2024-02-01 DIAGNOSIS — E669 Obesity, unspecified: Secondary | ICD-10-CM | POA: Diagnosis not present

## 2024-02-01 DIAGNOSIS — E119 Type 2 diabetes mellitus without complications: Secondary | ICD-10-CM | POA: Insufficient documentation

## 2024-02-01 DIAGNOSIS — Z79899 Other long term (current) drug therapy: Secondary | ICD-10-CM | POA: Insufficient documentation

## 2024-02-01 DIAGNOSIS — R000 Tachycardia, unspecified: Secondary | ICD-10-CM | POA: Insufficient documentation

## 2024-02-01 DIAGNOSIS — Z7984 Long term (current) use of oral hypoglycemic drugs: Secondary | ICD-10-CM | POA: Insufficient documentation

## 2024-02-01 DIAGNOSIS — I10 Essential (primary) hypertension: Secondary | ICD-10-CM | POA: Insufficient documentation

## 2024-02-01 DIAGNOSIS — J210 Acute bronchiolitis due to respiratory syncytial virus: Secondary | ICD-10-CM | POA: Diagnosis not present

## 2024-02-01 DIAGNOSIS — Z6836 Body mass index (BMI) 36.0-36.9, adult: Secondary | ICD-10-CM | POA: Insufficient documentation

## 2024-02-01 LAB — RESP PANEL BY RT-PCR (RSV, FLU A&B, COVID)  RVPGX2
Influenza A by PCR: NEGATIVE
Influenza B by PCR: NEGATIVE
Resp Syncytial Virus by PCR: POSITIVE — AB
SARS Coronavirus 2 by RT PCR: NEGATIVE

## 2024-02-01 MED ORDER — ACETAMINOPHEN 500 MG PO TABS
1000.0000 mg | ORAL_TABLET | Freq: Once | ORAL | Status: AC
  Administered 2024-02-01: 1000 mg via ORAL
  Filled 2024-02-01: qty 2

## 2024-02-01 MED ORDER — GUAIFENESIN-DM 100-10 MG/5ML PO SYRP: 5.0000 mL | ORAL_SOLUTION | ORAL | 0 refills | Status: DC | PRN

## 2024-02-01 MED ORDER — ACETAMINOPHEN 325 MG PO TABS: 650.0000 mg | ORAL_TABLET | Freq: Four times a day (QID) | ORAL | 0 refills | Status: AC | PRN

## 2024-02-01 MED ORDER — OXYMETAZOLINE HCL 0.05 % NA SOLN: 1.0000 | Freq: Two times a day (BID) | NASAL | 0 refills | Status: AC

## 2024-02-01 NOTE — ED Triage Notes (Signed)
 Pt states cough, chills, fever since yesterday  Denies any pain  Wants to be tested for covid and flu

## 2024-02-01 NOTE — Discharge Instructions (Addendum)
  You have been seen in the Emergency Department (ED) today for a likely viral illness.  Please drink plenty of clear fluids (water, Gatorade, chicken broth, etc).  You may use Tylenol and/or Motrin according to label instructions.  You can alternate between the two without any side effects.  Recommend use a humidifier in your bedroom.  Drink lots of fluids, get plenty of rest.  Please follow up with your doctor as listed above.  Call your doctor or return to the Emergency Department (ED) if you are unable to tolerate fluids due to vomiting, have worsening trouble breathing, become extremely tired or difficult to awaken, or if you develop any other symptoms that concern you.

## 2024-02-01 NOTE — ED Notes (Signed)
 ED Provider at bedside.

## 2024-02-01 NOTE — ED Provider Notes (Signed)
  [facility] DEPARTMENT AT [facility] Provider Note  CSN: [id number] Arrival date & time: 02/01/24 4098  Chief Complaint(s) Flu like symptoms   HPI Dylan Munoz. is a 48 y.o. male with past medical history as below, significant for HLD, HTN, obesity, OSA, type II DM, GAD who presents to the ED with complaint of cough, URI symptoms  Onset on Thursday, nonproductive cough, postnasal drip, clear rhinorrhea, body aches, no fevers or chills.  No vomiting, no change in bowel or bladder function.  Taking his regular medications, does not take his medications this morning.  Worried that he has the flu.  Past Medical History Past Medical History:  Diagnosis Date   High cholesterol    Hypertension    Morbid obesity (HCC) 04/17/2016   Severe obstructive sleep apnea 04/17/2016   AHI 14.9 with desats <88% for >90 mins on SNAP report 04/02/18. See scanned document.   Testosterone deficiency 04/19/2016   Vitamin D deficiency 04/19/2016   Patient Active Problem List   Diagnosis Date Noted   Type 2 diabetes mellitus with hyperglycemia (HCC) 12/05/2023   Generalized anxiety disorder 12/25/2022   Palpitations 04/04/2020   Sinus tachycardia 04/03/2020   Alcohol abuse 07/04/2019   B12 deficiency 11/09/2018   Constipation 06/26/2018   Cramping of hands 05/28/2016   Hyperlipidemia 04/19/2016   Prediabetes 04/19/2016   Testosterone deficiency 04/19/2016   Vitamin D deficiency 04/19/2016   HTN (hypertension) 04/17/2016   Morbid obesity (HCC) 04/17/2016   Severe obstructive sleep apnea 04/17/2016   Left knee pain 04/17/2016   Paresthesia and pain of both upper extremities 04/17/2016   Home Medication(s) Prior to Admission medications   Medication Sig Start Date End Date Taking? Authorizing Provider  acetaminophen (TYLENOL) 325 MG tablet Take 2 tablets (650 mg total) by mouth every 6 (six) hours as needed. 02/01/24  Yes Tanda Rockers A, DO  guaiFENesin-dextromethorphan (ROBITUSSIN  DM) 100-10 MG/5ML syrup Take 5 mLs by mouth every 4 (four) hours as needed for cough. 02/01/24  Yes Sloan Leiter, DO  oxymetazoline (AFRIN NASAL SPRAY) 0.05 % nasal spray Place 1 spray into both nostrils 2 (two) times daily for 3 days. 02/01/24 02/04/24 Yes Sloan Leiter, DO  Alcohol Swabs (ALCOHOL PREP) PADS Use to clean skin and vials for testosterone injections. 11/12/17   Rodolph Bong, MD  AMBULATORY NON FORMULARY MEDICATION Please set CPAP to auto-titration from 10-20.  \  Adjust mask fit.  Send 30 day compliance report 30 days after change is made.   Send to [facility] 07/30/19   Rodolph Bong, MD  atorvastatin (LIPITOR) 80 MG tablet Take 1 tablet (80 mg total) by mouth daily. 12/05/23   Everrett Coombe, DO  escitalopram (LEXAPRO) 10 MG tablet TAKE 1 TABLET BY MOUTH EVERY DAY. Needs appointment. 12/05/23   Everrett Coombe, DO  hydrOXYzine (VISTARIL) 25 MG capsule Take 1 capsule (25 mg total) by mouth every 8 (eight) hours as needed for anxiety. 12/05/23   Everrett Coombe, DO  losartan (COZAAR) 50 MG tablet Take 1 tablet (50 mg total) by mouth daily. 12/05/23   Everrett Coombe, DO  metFORMIN (GLUCOPHAGE-XR) 500 MG 24 hr tablet Take 1 tablet (500 mg total) by mouth daily with breakfast. 12/05/23   Everrett Coombe, DO  NEEDLE, DISP, 18 G 18G X 1" MISC Inject testosterone once weekly. 11/21/20   Everrett Coombe, DO  Syringe/Needle, Disp, (SYRINGE 3CC/22GX1-1/2") 22G X 1-1/2" 3 ML MISC Inject testosterone once weekly. 11/21/20   Everrett Coombe, DO  testosterone cypionate (DEPOTESTOSTERONE CYPIONATE) 200 MG/ML injection INJECT 0.5 ML INTO THE MUSCLE ONCE A WEEK. 12/05/23   Everrett Coombe, DO  tirzepatide [facility]) 12.5 MG/0.5ML Pen INJECT 12.5 MG SUBCUTANEOUSLY ONE TIME PER WEEK 03/05/23   Everrett Coombe, DO  tirzepatide [facility]) 15 MG/0.5ML Pen Inject 15 mg into the skin once a week. 12/05/23   Everrett Coombe, DO  tiZANidine (ZANAFLEX) 4 MG tablet Take 1 tablet (4 mg total) by mouth every 6 (six) hours as  needed for muscle spasms. 04/21/23   Everrett Coombe, DO                                                                                                                                    Past Surgical History Past Surgical History:  Procedure Laterality Date   KNEE ARTHROSCOPY W/ ACL RECONSTRUCTION     Family History Family History  Problem Relation Age of Onset   Heart disease Father    Diabetes Paternal Uncle    Colon cancer Neg Hx    Esophageal cancer Neg Hx     Social History Social History   Tobacco Use   Smoking status: Never   Smokeless tobacco: Current    Types: Snuff  Vaping Use   Vaping status: Never Used  Substance Use Topics   Alcohol use: Not Currently    Comment: weekly   Drug use: No   Allergies Amlodipine  Review of Systems A thorough review of systems was obtained and all systems are negative except as noted in the HPI and PMH.   Physical Exam Vital Signs  I have reviewed the triage vital signs BP 123/86   Pulse (!) 113   Temp 99.8 F (37.7 C) (Oral)   Resp 18   Ht 6' (1.829 m)   Wt 123 kg   SpO2 98%   BMI 36.78 kg/m  Physical Exam Vitals and nursing note reviewed.  Constitutional:      General: He is not in acute distress.    Appearance: He is well-developed. He is obese.  HENT:     Head: Normocephalic and atraumatic.     Right Ear: External ear normal.     Left Ear: External ear normal.     Mouth/Throat:     Mouth: Mucous membranes are moist.  Eyes:     General: No scleral icterus. Cardiovascular:     Rate and Rhythm: Regular rhythm. Tachycardia present.     Pulses: Normal pulses.     Heart sounds: Normal heart sounds. No murmur heard. Pulmonary:     Effort: Pulmonary effort is normal. No respiratory distress.     Breath sounds: Normal breath sounds. No wheezing.  Abdominal:     General: Abdomen is flat.     Palpations: Abdomen is soft.     Tenderness: There is no abdominal tenderness.  Musculoskeletal:     Cervical back: No  rigidity.     Right  lower leg: No edema.     Left lower leg: No edema.  Skin:    General: Skin is warm and dry.     Capillary Refill: Capillary refill takes less than 2 seconds.  Neurological:     Mental Status: He is alert.  Psychiatric:        Mood and Affect: Mood normal.        Behavior: Behavior normal.     ED Results and Treatments Labs (all labs ordered are listed, but only abnormal results are displayed) Labs Reviewed  RESP PANEL BY RT-PCR (RSV, FLU A&B, COVID)  RVPGX2 - Abnormal; Notable for the following components:      Result Value   Resp Syncytial Virus by PCR POSITIVE (*)    All other components within normal limits                                                                                                                          Radiology No results found.  Pertinent labs & imaging results that were available during my care of the patient were reviewed by me and considered in my medical decision making (see MDM for details).  Medications Ordered in ED Medications  acetaminophen (TYLENOL) tablet 1,000 mg (has no administration in time range)                                                                                                                                     Procedures Procedures  (including critical care time)  Medical Decision Making / ED Course    Medical Decision Making:    Elison Worrel. is a 48 y.o. male  with past medical history as below, significant for HLD, HTN, obesity, OSA, type II DM, GAD who presents to the ED with complaint of cough, URI symptoms. The complaint involves an extensive differential diagnosis and also carries with it a high risk of complications and morbidity.  Serious etiology was considered. Ddx includes but is not limited to: Influenza, flu, RSV, viral, pneumonia, etc.  Complete initial physical exam performed, notably the patient was in no distress, tachycardia was noted..    Reviewed and confirmed  nursing documentation for past medical history, family history, social history.  Vital signs reviewed.    Clinical Course as of 02/01/24 1051  Sun Feb 01, 2024  1023 Respiratory Syncytial Virus by PCR(!): POSITIVE [SG]  Clinical Course User Index [SG] Sloan Leiter, DO    Brief summary: 48 year old male with history above here with URI symptoms.  Concerned he has the flu.  Swab was positive for RSV.  He has no significant hypoxia, no significant dyspnea.  Lungs clear, no dib, low suspicion for pneumonia.  He has low-grade temperature, give Tylenol.  Does have chronic elevated heart rate, did not take his antihypertensive this morning.  Also has RSV.  Likely provoking his elevated heart rate.  He has no palpitations or chest pain or dyspnea.  Rate improved during stay in the ER.  Discussed supportive care at home, follow-up PCP  The patient improved significantly and was discharged in stable condition. Detailed discussions were had with the patient/guardian regarding current findings, and need for close f/u with PCP or on call doctor. The patient/guardian has been instructed to return immediately if the symptoms worsen in any way for re-evaluation. Patient/guardian verbalized understanding and is in agreement with current care plan. All questions answered prior to discharge.                Additional history obtained: -Additional history obtained from na -External records from outside source obtained and reviewed including: Chart review including previous notes, labs, imaging, consultation notes including  Home medications, prior vital signs   Lab Tests: -I ordered, reviewed, and interpreted labs.   The pertinent results include:   Labs Reviewed  RESP PANEL BY RT-PCR (RSV, FLU A&B, COVID)  RVPGX2 - Abnormal; Notable for the following components:      Result Value   Resp Syncytial Virus by PCR POSITIVE (*)    All other components within normal limits    Notable for  RSV  EKG   EKG Interpretation Date/Time:    Ventricular Rate:    PR Interval:    QRS Duration:    QT Interval:    QTC Calculation:   R Axis:      Text Interpretation:           Imaging Studies ordered: na  Medicines ordered and prescription drug management: Meds ordered this encounter  Medications   acetaminophen (TYLENOL) tablet 1,000 mg   acetaminophen (TYLENOL) 325 MG tablet    Sig: Take 2 tablets (650 mg total) by mouth every 6 (six) hours as needed.    Dispense:  36 tablet    Refill:  0   guaiFENesin-dextromethorphan (ROBITUSSIN DM) 100-10 MG/5ML syrup    Sig: Take 5 mLs by mouth every 4 (four) hours as needed for cough.    Dispense:  118 mL    Refill:  0   oxymetazoline (AFRIN NASAL SPRAY) 0.05 % nasal spray    Sig: Place 1 spray into both nostrils 2 (two) times daily for 3 days.    Dispense:  15 mL    Refill:  0    -I have reviewed the patients home medicines and have made adjustments as needed   Consultations Obtained: na   Cardiac Monitoring: The patient was maintained on a cardiac monitor.  I personally viewed and interpreted the cardiac monitored which showed an underlying rhythm of: sinus tachy > improved Continuous pulse oximetry interpreted by myself, 99% on RA.    Social Determinants of Health:  Diagnosis or treatment significantly limited by social determinants of health: obesity   Reevaluation: After the interventions noted above, I reevaluated the patient and found that they have improved  Co morbidities that complicate the patient evaluation  Past Medical History:  Diagnosis  Date   High cholesterol    Hypertension    Morbid obesity (HCC) 04/17/2016   Severe obstructive sleep apnea 04/17/2016   AHI 14.9 with desats <88% for >90 mins on SNAP report 04/02/18. See scanned document.   Testosterone deficiency 04/19/2016   Vitamin D deficiency 04/19/2016      Dispostion: Disposition decision including need for hospitalization was considered,  and patient discharged from emergency department.    Final Clinical Impression(s) / ED Diagnoses Final diagnoses:  RSV (acute bronchiolitis due to respiratory syncytial virus)  Sinus tachycardia        Sloan Leiter, DO 02/01/24 1051

## 2024-02-19 NOTE — Progress Notes (Signed)
 New patient visit   Patient: Dylan Munoz.   DOB: Dec 23, 1975   48 y.o. Male  MRN: [id number] Visit Date: 02/20/2024  Today's healthcare provider: Alfredia Ferguson, PA-C   Cc. New pt establish care  Subjective    Dylan Munoz. is a 48 y.o. male who presents today as a new patient to establish care.  Discussed the use of AI scribe software for clinical note transcription with the patient, who gave verbal consent to proceed.  History of Present Illness   The patient, with a history of diabetes, hypertension, hyperlipidemia, and low vitamin D, presents for a routine visit to establish care and refill medications. The patient also has a history of testosterone replacement therapy, which he has been off for about three weeks.   He reports hand cramping, which has occurred before and was improved with vitamin D supplementation. He also reports fatigue and suspects low iron levels.   The patient also presents with a cyst on his left brow that has been present for about a month and has previously drained. He requests for the cyst to be drained during the visit.      Past Medical History:  Diagnosis Date   High cholesterol    Hypertension    Morbid obesity (HCC) 04/17/2016   Severe obstructive sleep apnea 04/17/2016   AHI 14.9 with desats <88% for >90 mins on SNAP report 04/02/18. See scanned document.   Testosterone deficiency 04/19/2016   Vitamin D deficiency 04/19/2016   Past Surgical History:  Procedure Laterality Date   KNEE ARTHROSCOPY W/ ACL RECONSTRUCTION     Family Status  Relation Name Status   Father  (Not Specified)   Oneal Grout  (Not Specified)   Neg Hx  (Not Specified)  No partnership data on file   Family History  Problem Relation Age of Onset   Heart disease Father    Diabetes Paternal Uncle    Colon cancer Neg Hx    Esophageal cancer Neg Hx    Social History   Socioeconomic History   Marital status: Single    Spouse name: Not on file   Number of  children: Not on file   Years of education: Not on file   Highest education level: Not on file  Occupational History   Not on file  Tobacco Use   Smoking status: Never   Smokeless tobacco: Current    Types: Snuff  Vaping Use   Vaping status: Never Used  Substance and Sexual Activity   Alcohol use: Not Currently    Comment: weekly   Drug use: No   Sexual activity: Yes  Other Topics Concern   Not on file  Social History Narrative   Not on file   Social Drivers of Health   Financial Resource Strain: Not on file  Food Insecurity: Not on file  Transportation Needs: Not on file  Physical Activity: Not on file  Stress: Not on file  Social Connections: Not on file   Outpatient Medications Prior to Visit  Medication Sig   acetaminophen (TYLENOL) 325 MG tablet Take 2 tablets (650 mg total) by mouth every 6 (six) hours as needed.   Alcohol Swabs (ALCOHOL PREP) PADS Use to clean skin and vials for testosterone injections.   AMBULATORY NON FORMULARY MEDICATION Please set CPAP to auto-titration from 10-20.  \  Adjust mask fit.  Send 30 day compliance report 30 days after change is made.   Send to [facility]   atorvastatin (LIPITOR)  80 MG tablet Take 1 tablet (80 mg total) by mouth daily.   escitalopram (LEXAPRO) 10 MG tablet TAKE 1 TABLET BY MOUTH EVERY DAY. Needs appointment.   guaiFENesin-dextromethorphan (ROBITUSSIN DM) 100-10 MG/5ML syrup Take 5 mLs by mouth every 4 (four) hours as needed for cough.   hydrOXYzine (VISTARIL) 25 MG capsule Take 1 capsule (25 mg total) by mouth every 8 (eight) hours as needed for anxiety.   losartan (COZAAR) 50 MG tablet Take 1 tablet (50 mg total) by mouth daily.   metFORMIN (GLUCOPHAGE-XR) 500 MG 24 hr tablet Take 1 tablet (500 mg total) by mouth daily with breakfast.   NEEDLE, DISP, 18 G 18G X 1" MISC Inject testosterone once weekly.   Syringe/Needle, Disp, (SYRINGE 3CC/22GX1-1/2") 22G X 1-1/2" 3 ML MISC Inject testosterone once weekly.    testosterone cypionate (DEPOTESTOSTERONE CYPIONATE) 200 MG/ML injection INJECT 0.5 ML INTO THE MUSCLE ONCE A WEEK.   tiZANidine (ZANAFLEX) 4 MG tablet Take 1 tablet (4 mg total) by mouth every 6 (six) hours as needed for muscle spasms.   [DISCONTINUED] tirzepatide (MOUNJARO) 12.5 MG/0.5ML Pen INJECT 12.5 MG SUBCUTANEOUSLY ONE TIME PER WEEK   [DISCONTINUED] tirzepatide (MOUNJARO) 15 MG/0.5ML Pen Inject 15 mg into the skin once a week.   No facility-administered medications prior to visit.   Allergies  Allergen Reactions   Amlodipine Other (See Comments)    Constipation    Immunization History  Administered Date(s) Administered   Influenza, Seasonal, Injecte, Preservative Fre 12/05/2023   Influenza,inj,Quad PF,6+ Mos 08/12/2017, 09/25/2018, 07/30/2019, 09/22/2020, 09/04/2021, 12/25/2022   Moderna Sars-Covid-2 Vaccination 07/15/2020, 08/11/2020   Pfizer(Comirnaty)Fall Seasonal Vaccine 12 years and older 12/25/2022   Tdap 04/17/2016    Health Maintenance  Topic Date Due   Pneumococcal Vaccine 39-59 Years old (1 of 2 - PCV) Never done   FOOT EXAM  Never done   HIV Screening  Never done   Diabetic kidney evaluation - Urine ACR  Never done   Hepatitis C Screening  Never done   OPHTHALMOLOGY EXAM  05/22/2023   COVID-19 Vaccine (4 - 2024-25 season) 08/17/2023   HEMOGLOBIN A1C  06/04/2024   Diabetic kidney evaluation - eGFR measurement  12/10/2024   DTaP/Tdap/Td (2 - Td or Tdap) 04/17/2026   Colonoscopy  10/07/2029   INFLUENZA VACCINE  Completed   HPV VACCINES  Aged Out    Patient Care Team: Alfredia Ferguson, PA-C as PCP - General (Physician Assistant)  Review of Systems  Constitutional:  Negative for fatigue and fever.  Respiratory:  Negative for cough and shortness of breath.   Cardiovascular:  Negative for chest pain, palpitations and leg swelling.  Neurological:  Negative for dizziness and headaches.        Objective    BP 93/68   Pulse (!) 116   Ht 6' (1.829 m)   Wt  263 lb 3.2 oz (119.4 kg)   BMI 35.70 kg/m     Physical Exam Constitutional:      General: He is awake.     Appearance: He is well-developed.  HENT:     Head: Normocephalic.  Eyes:     Conjunctiva/sclera: Conjunctivae normal.  Cardiovascular:     Rate and Rhythm: Normal rate and regular rhythm.     Heart sounds: Normal heart sounds.  Pulmonary:     Effort: Pulmonary effort is normal.     Breath sounds: Normal breath sounds.  Skin:    General: Skin is warm.     Comments: L browbone there is a <  1 cm well circumscribed mass  Neurological:     Mental Status: He is alert and oriented to person, place, and time.  Psychiatric:        Attention and Perception: Attention normal.        Mood and Affect: Mood normal.        Speech: Speech normal.        Behavior: Behavior is cooperative.     Depression Screen    02/20/2024    1:41 PM 12/05/2023   10:59 AM 04/18/2023   11:04 AM 12/25/2022    3:10 PM  PHQ 2/9 Scores  PHQ - 2 Score 0 0 1 1  PHQ- 9 Score   3    Results for orders placed or performed in visit on 02/20/24  HM COLONOSCOPY  Result Value Ref Range   HM Colonoscopy See Report (in chart) See Report (in chart), Patient Reported    Assessment & Plan     Type 2 diabetes mellitus with hyperglycemia, without long-term current use of insulin (HCC) Assessment & Plan: Ordering A1c today Manages with metformin 500 mg XR and mounjaro 15 mg . Ordering uacr  On statin, arb.  Will need foot exam next visit F/u 4-6 mo pending a1c  Orders: -     Lipid panel -     Hemoglobin A1c -     Mounjaro; Inject 15 mg into the skin once a week.  Dispense: 6 mL; Refill: 1 -     CBC with Differential/Platelet -     Microalbumin / creatinine urine ratio  Primary hypertension Assessment & Plan: BP low today-- pt reports he is fasting.  Will monitor, manages with losartan 50 mg. May need to pull back on dosing. Ordered cmp F/u 6 mo  Orders: -     Comprehensive metabolic panel  Low  testosterone Assessment & Plan: Pt has been without testosterone supplementation x 3 weeks. Advised I do not typically prescribe, referring to urology  Orders: -     Testosterone,Free and Total -     Ambulatory referral to Urology  Vitamin D deficiency Assessment & Plan: Historically. Not supplementing at this time Repeat vit d level  Orders: -     VITAMIN D 25 Hydroxy (Vit-D Deficiency, Fractures)  Hand cramps Assessment & Plan: Will check cmp, mag level  Orders: -     Iron, TIBC and Ferritin Panel -     Magnesium  Sebaceous cyst of left eyelid Assessment & Plan: Per pt request, drained today. See I&D note. Advised pt for full resolution would need removal. Referring to derm.    Orders: -     Ambulatory referral to Dermatology -     I & D  Encounter for screening for HIV -     HIV Antibody (routine testing w rflx)  Encounter for hepatitis C screening test for low risk patient -     Hepatitis C antibody   I & D  Date/Time: 02/20/2024 4:41 PM  Performed by: Alfredia Ferguson, PA-C Authorized by: Alfredia Ferguson, PA-C   Consent:    Consent obtained:  Verbal   Consent given by:  Patient   Risks, benefits, and alternatives were discussed: yes     Risks discussed:  Bleeding, incomplete drainage and infection   Alternatives discussed:  No treatment Location:    Type:  Cyst   Location:  Head   Head location:  Face Pre-procedure details:    Skin preparation:  Povidone-iodine Sedation:    Sedation  type:  None Anesthesia:    Anesthesia method:  None Procedure type:    Complexity:  Simple Procedure details:    Needle aspiration: no     Incision types:  Stab incision   Scalpel blade:  10   Drainage:  Purulent   Drainage amount:  Moderate   Packing materials:  None Post-procedure details:    Procedure completion:  Tolerated well, no immediate complications Comments:     Area was bandaged. Pt advised to use warm compresses to fully drain.     Return if  symptoms worsen or fail to improve, for f/u pending labs.      Alfredia Ferguson, PA-C  [facility] [phone] (phone) [phone] (fax)  [facility]

## 2024-02-20 ENCOUNTER — Encounter: Payer: Self-pay | Admitting: Physician Assistant

## 2024-02-20 ENCOUNTER — Ambulatory Visit: Payer: BC Managed Care – PPO | Admitting: Physician Assistant

## 2024-02-20 VITALS — BP 93/68 | HR 116 | Ht 72.0 in | Wt 263.2 lb

## 2024-02-20 DIAGNOSIS — H02826 Cysts of left eye, unspecified eyelid: Secondary | ICD-10-CM | POA: Diagnosis not present

## 2024-02-20 DIAGNOSIS — Z7985 Long-term (current) use of injectable non-insulin antidiabetic drugs: Secondary | ICD-10-CM

## 2024-02-20 DIAGNOSIS — E1165 Type 2 diabetes mellitus with hyperglycemia: Secondary | ICD-10-CM | POA: Diagnosis not present

## 2024-02-20 DIAGNOSIS — Z1159 Encounter for screening for other viral diseases: Secondary | ICD-10-CM

## 2024-02-20 DIAGNOSIS — R252 Cramp and spasm: Secondary | ICD-10-CM | POA: Diagnosis not present

## 2024-02-20 DIAGNOSIS — Z7984 Long term (current) use of oral hypoglycemic drugs: Secondary | ICD-10-CM

## 2024-02-20 DIAGNOSIS — Z114 Encounter for screening for human immunodeficiency virus [HIV]: Secondary | ICD-10-CM

## 2024-02-20 DIAGNOSIS — I10 Essential (primary) hypertension: Secondary | ICD-10-CM

## 2024-02-20 DIAGNOSIS — E559 Vitamin D deficiency, unspecified: Secondary | ICD-10-CM | POA: Diagnosis not present

## 2024-02-20 DIAGNOSIS — R7989 Other specified abnormal findings of blood chemistry: Secondary | ICD-10-CM | POA: Diagnosis not present

## 2024-02-20 LAB — LIPID PANEL
Cholesterol: 148 (ref 0–200)
HDL: 58 (ref 35–70)
LDL Cholesterol: 68
LDl/HDL Ratio: 2.6

## 2024-02-20 LAB — HEPATIC FUNCTION PANEL: Bilirubin, Total: 1.6

## 2024-02-20 LAB — COMPREHENSIVE METABOLIC PANEL
Albumin: 4 (ref 3.5–5.0)
Globulin: 2.9

## 2024-02-20 LAB — CBC AND DIFFERENTIAL
HCT: 36 — AB (ref 41–53)
Hemoglobin: 12 — AB (ref 13.5–17.5)
Platelets: 275 10*3/uL (ref 150–400)

## 2024-02-20 LAB — BASIC METABOLIC PANEL: Glucose: 82

## 2024-02-20 LAB — CBC: RBC: 3.56 — AB (ref 3.87–5.11)

## 2024-02-20 MED ORDER — MOUNJARO 15 MG/0.5ML ~~LOC~~ SOAJ: 15.0000 mg | SUBCUTANEOUS | 1 refills | Status: DC

## 2024-02-20 NOTE — Assessment & Plan Note (Signed)
 Ordering A1c today Manages with metformin 500 mg XR and mounjaro 15 mg . Ordering uacr  On statin, arb.  Will need foot exam next visit F/u 4-6 mo pending a1c

## 2024-02-20 NOTE — Assessment & Plan Note (Signed)
 Pt has been without testosterone supplementation x 3 weeks. Advised I do not typically prescribe, referring to urology

## 2024-02-20 NOTE — Assessment & Plan Note (Addendum)
 BP low today-- pt reports he is fasting.  Will monitor, manages with losartan 50 mg. May need to pull back on dosing. Ordered cmp F/u 6 mo

## 2024-02-20 NOTE — Assessment & Plan Note (Signed)
 Will check cmp, mag level

## 2024-02-20 NOTE — Assessment & Plan Note (Signed)
 Per pt request, drained today. See I&D note. Advised pt for full resolution would need removal. Referring to derm.

## 2024-02-20 NOTE — Assessment & Plan Note (Signed)
 Historically. Not supplementing at this time Repeat vit d level

## 2024-02-21 LAB — HEPATITIS C ANTIBODY: Hepatitis C Ab: NONREACTIVE

## 2024-02-21 LAB — IRON,TIBC AND FERRITIN PANEL
%SAT: 44 % (ref 20–48)
Ferritin: 837 ng/mL — ABNORMAL HIGH (ref 38–380)
Iron: 115 ug/dL (ref 50–180)
TIBC: 260 ug/dL (ref 250–425)

## 2024-02-21 LAB — HIV ANTIBODY (ROUTINE TESTING W REFLEX): HIV 1&2 Ab, 4th Generation: NONREACTIVE

## 2024-02-23 ENCOUNTER — Encounter: Payer: Self-pay | Admitting: Physician Assistant

## 2024-02-24 ENCOUNTER — Encounter: Payer: Self-pay | Admitting: Physician Assistant

## 2024-02-24 ENCOUNTER — Other Ambulatory Visit: Payer: Self-pay | Admitting: Physician Assistant

## 2024-02-24 DIAGNOSIS — D649 Anemia, unspecified: Secondary | ICD-10-CM

## 2024-02-24 DIAGNOSIS — E559 Vitamin D deficiency, unspecified: Secondary | ICD-10-CM

## 2024-02-24 MED ORDER — MAGNESIUM CHLORIDE 64 MG PO TABS: 5.0000 | ORAL_TABLET | Freq: Every day | ORAL | 0 refills | Status: DC

## 2024-02-24 MED ORDER — CHOLECALCIFEROL 1.25 MG (50000 UT) PO TABS
1.0000 | ORAL_TABLET | ORAL | 0 refills | Status: DC
Start: 2024-02-24 — End: 2024-03-01

## 2024-02-24 NOTE — Telephone Encounter (Signed)
 See results management

## 2024-02-25 NOTE — Progress Notes (Signed)
 Patient reviewed results and notes. He reported he will come in Friday for labs. Was put on lab schedule.

## 2024-02-26 LAB — CBC WITH DIFFERENTIAL/PLATELET
Basophils Absolute: 0 10*3/uL (ref 0.0–0.2)
Basos: 1 %
EOS (ABSOLUTE): 0.1 10*3/uL (ref 0.0–0.4)
Eos: 1 %
Hematocrit: 35.9 % — ABNORMAL LOW (ref 37.5–51.0)
Hemoglobin: 12 g/dL — ABNORMAL LOW (ref 13.0–17.7)
Immature Grans (Abs): 0 10*3/uL (ref 0.0–0.1)
Immature Granulocytes: 0 %
Lymphocytes Absolute: 1.3 10*3/uL (ref 0.7–3.1)
Lymphs: 24 %
MCH: 33.7 pg — ABNORMAL HIGH (ref 26.6–33.0)
MCHC: 33.4 g/dL (ref 31.5–35.7)
MCV: 101 fL — ABNORMAL HIGH (ref 79–97)
Monocytes Absolute: 0.4 10*3/uL (ref 0.1–0.9)
Monocytes: 7 %
Neutrophils Absolute: 3.8 10*3/uL (ref 1.4–7.0)
Neutrophils: 67 %
Platelets: 275 10*3/uL (ref 150–450)
RBC: 3.56 x10E6/uL — ABNORMAL LOW (ref 4.14–5.80)
RDW: 17 % — ABNORMAL HIGH (ref 11.6–15.4)
WBC: 5.7 10*3/uL (ref 3.4–10.8)

## 2024-02-26 LAB — COMPREHENSIVE METABOLIC PANEL
ALT: 21 IU/L (ref 0–44)
AST: 32 IU/L (ref 0–40)
Albumin: 4 g/dL — ABNORMAL LOW (ref 4.1–5.1)
Alkaline Phosphatase: 100 IU/L (ref 44–121)
BUN/Creatinine Ratio: 10 (ref 9–20)
BUN: 11 mg/dL (ref 6–24)
Bilirubin Total: 1.6 mg/dL — ABNORMAL HIGH (ref 0.0–1.2)
CO2: 21 mmol/L (ref 20–29)
Calcium: 8.1 mg/dL — ABNORMAL LOW (ref 8.7–10.2)
Chloride: 101 mmol/L (ref 96–106)
Creatinine, Ser: 1.08 mg/dL (ref 0.76–1.27)
Globulin, Total: 2.9 g/dL (ref 1.5–4.5)
Glucose: 82 mg/dL (ref 70–99)
Potassium: 4.9 mmol/L (ref 3.5–5.2)
Sodium: 142 mmol/L (ref 134–144)
Total Protein: 6.9 g/dL (ref 6.0–8.5)
eGFR: 85 mL/min/{1.73_m2} (ref >59)

## 2024-02-26 LAB — LIPID PANEL
Chol/HDL Ratio: 2.6 ratio (ref 0.0–5.0)
Cholesterol, Total: 148 mg/dL (ref 100–199)
HDL: 58 mg/dL (ref >39)
LDL Chol Calc (NIH): 68 mg/dL (ref 0–99)
Triglycerides: 123 mg/dL (ref 0–149)
VLDL Cholesterol Cal: 22 mg/dL (ref 5–40)

## 2024-02-26 LAB — HEMOGLOBIN A1C
Est. average glucose Bld gHb Est-mCnc: 105 mg/dL
Hgb A1c MFr Bld: 5.3 % (ref 4.8–5.6)

## 2024-02-26 LAB — MAGNESIUM: Magnesium: 1.3 mg/dL — ABNORMAL LOW (ref 1.6–2.3)

## 2024-02-26 LAB — VITAMIN D 25 HYDROXY (VIT D DEFICIENCY, FRACTURES): Vit D, 25-Hydroxy: 17.3 ng/mL — ABNORMAL LOW (ref 30.0–100.0)

## 2024-02-26 LAB — TESTOSTERONE,FREE AND TOTAL
Testosterone, Free: 2.5 pg/mL — ABNORMAL LOW (ref 6.8–21.5)
Testosterone: 95 ng/dL — ABNORMAL LOW (ref 264–916)

## 2024-02-27 ENCOUNTER — Other Ambulatory Visit: Payer: Self-pay | Admitting: Physician Assistant

## 2024-02-27 ENCOUNTER — Other Ambulatory Visit (INDEPENDENT_AMBULATORY_CARE_PROVIDER_SITE_OTHER)

## 2024-02-27 DIAGNOSIS — D649 Anemia, unspecified: Secondary | ICD-10-CM

## 2024-02-27 DIAGNOSIS — E1165 Type 2 diabetes mellitus with hyperglycemia: Secondary | ICD-10-CM | POA: Diagnosis not present

## 2024-02-27 NOTE — Addendum Note (Signed)
 Addended by: Mervin Kung A on: 02/27/2024 01:43 PM   Modules accepted: Orders

## 2024-02-27 NOTE — Addendum Note (Signed)
 Addended by: Mervin Kung A on: 02/27/2024 01:50 PM   Modules accepted: Orders

## 2024-02-28 LAB — PTH, INTACT AND CALCIUM
Calcium: 8.4 mg/dL — ABNORMAL LOW (ref 8.6–10.3)
PTH: 84 pg/mL — ABNORMAL HIGH (ref 16–77)

## 2024-02-28 LAB — MAGNESIUM: Magnesium: 1.5 mg/dL (ref 1.5–2.5)

## 2024-02-28 LAB — MICROALBUMIN / CREATININE URINE RATIO
Creatinine, Urine: 185 mg/dL (ref 20–320)
Microalb Creat Ratio: 3 mg/g{creat} (ref <30)
Microalb, Ur: 0.5 mg/dL

## 2024-02-28 LAB — VITAMIN B12: Vitamin B-12: 188 pg/mL — ABNORMAL LOW (ref 200–1100)

## 2024-02-28 LAB — FOLATE: Folate: 6.6 ng/mL

## 2024-03-01 ENCOUNTER — Other Ambulatory Visit: Payer: Self-pay | Admitting: Physician Assistant

## 2024-03-01 ENCOUNTER — Telehealth: Payer: Self-pay

## 2024-03-01 DIAGNOSIS — E559 Vitamin D deficiency, unspecified: Secondary | ICD-10-CM

## 2024-03-01 DIAGNOSIS — R7989 Other specified abnormal findings of blood chemistry: Secondary | ICD-10-CM

## 2024-03-01 DIAGNOSIS — D649 Anemia, unspecified: Secondary | ICD-10-CM

## 2024-03-01 DIAGNOSIS — E538 Deficiency of other specified B group vitamins: Secondary | ICD-10-CM

## 2024-03-01 MED ORDER — MAGNESIUM CHLORIDE 64 MG PO TABS: 5.0000 | ORAL_TABLET | Freq: Every day | ORAL | 0 refills | Status: DC

## 2024-03-01 NOTE — Telephone Encounter (Signed)
 Copied from CRM [id number]. Topic: Clinical - Prescription Issue >> Feb 27, 2024  2:24 PM Kathryne Eriksson wrote: Reason for CRM: Magnesium Chloride 64 MG TABS >> Feb 27, 2024  2:26 PM Kathryne Eriksson wrote: Patient states the medication isn't covered by his insurance, therefor he's needing Drubel, Lillia Abed, PA-C to reach out to them and explain why is it the patient needs this medication.

## 2024-03-01 NOTE — Telephone Encounter (Signed)
 See result note.

## 2024-03-02 MED ORDER — CHOLECALCIFEROL 1.25 MG (50000 UT) PO TABS: 1.0000 | ORAL_TABLET | ORAL | 0 refills | Status: DC

## 2024-03-02 NOTE — Telephone Encounter (Signed)
 Prior auth for: TESTOSTERONE Determination: Pending Auth #: [id number] Valid from: n/a

## 2024-03-03 ENCOUNTER — Encounter: Payer: Self-pay | Admitting: *Deleted

## 2024-03-10 ENCOUNTER — Telehealth: Payer: Self-pay

## 2024-03-10 ENCOUNTER — Telehealth: Payer: Self-pay | Admitting: Pharmacy Technician

## 2024-03-10 ENCOUNTER — Other Ambulatory Visit (HOSPITAL_COMMUNITY): Payer: Self-pay

## 2024-03-10 NOTE — Telephone Encounter (Signed)
 Pharmacy Patient Advocate Encounter   Received notification from Onbase that prior authorization for Testosterone Cypionate 200MG /ML intramuscular solution is required/requested.   Insurance verification completed.   The patient is insured through [facility] .   Per test claim: PA required; PA submitted to above mentioned insurance via Fax Key/confirmation #/EOC [id number] Status is pending  This was a request for additional information with a PA form in Onbase that was already requested by the office. Form completed and faxed to [facility] today.

## 2024-03-10 NOTE — Telephone Encounter (Signed)
 Pharmacy Patient Advocate Encounter   Received notification from Onbase that prior authorization for Testosterone Cyp 200 mg/ml injection is required/requested.   Insurance verification completed.   The patient is insured through [facility] .   Per test claim: PA required; PA submitted to above mentioned insurance via Fax Key/confirmation #/EOC -- Status is pending

## 2024-03-12 ENCOUNTER — Other Ambulatory Visit (HOSPITAL_COMMUNITY): Payer: Self-pay

## 2024-03-12 NOTE — Telephone Encounter (Signed)
 Pharmacy Patient Advocate Encounter  Received notification from [facility] that Prior Authorization for Testosterone Cypionate 200MG /ML intramuscular solution  has been APPROVED from 03/02/2024 to 03/02/2025. Ran test claim, Copay is $10.00. This test claim was processed through [facility]- copay amounts may vary at other pharmacies due to pharmacy/plan contracts, or as the patient moves through the different stages of their insurance plan.   PA #/Case ID/Reference #:  [id number]

## 2024-03-22 ENCOUNTER — Other Ambulatory Visit (HOSPITAL_COMMUNITY): Payer: Self-pay

## 2024-03-22 NOTE — Telephone Encounter (Signed)
 Pharmacy Patient Advocate Encounter  Received notification from [facility] that Prior Authorization forTestosterone Cyp 200 mg/ml injection  has been APPROVED from 03/22/24 to 09/21/24. Ran test claim, Copay is $10. This test claim was processed through [facility]- copay amounts may vary at other pharmacies due to pharmacy/plan contracts, or as the patient moves through the different stages of their insurance plan.   PA #/Case ID/Reference #: --

## 2024-03-26 ENCOUNTER — Encounter: Payer: Self-pay | Admitting: Urology

## 2024-03-26 ENCOUNTER — Ambulatory Visit (INDEPENDENT_AMBULATORY_CARE_PROVIDER_SITE_OTHER): Admitting: Urology

## 2024-03-26 VITALS — BP 126/88 | HR 139 | Ht 71.0 in | Wt 261.0 lb

## 2024-03-26 DIAGNOSIS — E291 Testicular hypofunction: Secondary | ICD-10-CM

## 2024-03-26 NOTE — Progress Notes (Signed)
 Assessment: 1. Hypogonadism in male     Plan: I personally reviewed the patient's chart including provider notes, and lab results. Labs today: Prolactin, LH, estradiol, PSA I I discussed options for management of low testosterone including short acting injections (IM, Polo), topical therapy, oral therapy, long-acting injections, and subcutaneous pellets.  Risk of testosterone replacement therapy discussed with the patient. Information provided to the patient. He is interested in proceeding with Aveed.  Will get insurance authorization and schedule appointment for injection.  Chief Complaint:  Chief Complaint  Patient presents with   Hypogonadism    History of Present Illness:  Dylan Helle. is a 48 y.o. male who is seen in consultation from Alfredia Ferguson, PA-C for evaluation of hypogonadism. He has a history of hypogonadism.  He has been managed with IM testosterone injections for a number of years.  Review of his chart would indicate this was started in 2017.  He was using a dose of 100 mg weekly.  Testosterone level: 8/20 459 3/21 628 7/23 895 5/24 464 3/25 95  H&H 3/25 12/35.9 PSA 7/23 0.7  He has not received any testosterone injections for approximately 6 months.  He reports symptoms of decreased energy, decreased libido, and erectile dysfunction as well as loss of muscle mass. He is not having any lower urinary tract symptoms.  He is not interested in maintaining his fertility.  ADAM =  8/10 (no to 4,10)  Past Medical History:  Past Medical History:  Diagnosis Date   High cholesterol    Hypertension    Morbid obesity (HCC) 04/17/2016   Severe obstructive sleep apnea 04/17/2016   AHI 14.9 with desats <88% for >90 mins on SNAP report 04/02/18. See scanned document.   Testosterone deficiency 04/19/2016   Vitamin D deficiency 04/19/2016    Past Surgical History:  Past Surgical History:  Procedure Laterality Date   KNEE ARTHROSCOPY W/ ACL RECONSTRUCTION       Allergies:  Allergies  Allergen Reactions   Amlodipine Other (See Comments)    Constipation    Family History:  Family History  Problem Relation Age of Onset   Heart disease Father    Diabetes Paternal Uncle    Colon cancer Neg Hx    Esophageal cancer Neg Hx     Social History:  Social History   Tobacco Use   Smoking status: Never   Smokeless tobacco: Current    Types: Snuff  Vaping Use   Vaping status: Never Used  Substance Use Topics   Alcohol use: Not Currently    Comment: weekly   Drug use: No    Review of symptoms:  Constitutional:  Negative for unexplained weight loss, night sweats, fever, chills ENT:  Negative for nose bleeds, sinus pain, painful swallowing CV:  Negative for chest pain, shortness of breath, exercise intolerance, palpitations, loss of consciousness Resp:  Negative for cough, wheezing, shortness of breath GI:  Negative for nausea, vomiting, diarrhea, bloody stools GU:  Positives noted in HPI; otherwise negative for gross hematuria, dysuria, urinary incontinence Neuro:  Negative for seizures, poor balance, limb weakness, slurred speech Psych:  Negative for lack of energy, depression, anxiety Endocrine:  Negative for polydipsia, polyuria, symptoms of hypoglycemia (dizziness, hunger, sweating) Hematologic:  Negative for anemia, purpura, petechia, prolonged or excessive bleeding, use of anticoagulants  Allergic:  Negative for difficulty breathing or choking as a result of exposure to anything; no shellfish allergy; no allergic response (rash/itch) to materials, foods  Physical exam: BP 126/88  Pulse (!) 139   Ht 5\' 11"  (1.803 m)   Wt 261 lb (118.4 kg)   BMI 36.40 kg/m  GENERAL APPEARANCE:  Well appearing, well developed, well nourished, NAD HEENT: Atraumatic, Normocephalic, oropharynx clear. NECK: Supple without lymphadenopathy or thyromegaly. LUNGS: Clear to auscultation bilaterally. HEART: Regular Rate and Rhythm without murmurs,  gallops, or rubs. ABDOMEN: Soft, non-tender, No Masses. EXTREMITIES: Moves all extremities well.  Without clubbing, cyanosis, or edema. NEUROLOGIC:  Alert and oriented x 3, normal gait, CN II-XII grossly intact.  MENTAL STATUS:  Appropriate. BACK:  Non-tender to palpation.  No CVAT SKIN:  Warm, dry and intact.   GU: Penis:  circumcised Meatus: Normal Scrotum: normal, no masses Testis: small bilaterally, no mass or tenderness   Results: None

## 2024-03-27 LAB — ESTRADIOL: Estradiol: 86.4 pg/mL — ABNORMAL HIGH (ref 7.6–42.6)

## 2024-03-27 LAB — PROLACTIN: Prolactin: 12.9 ng/mL (ref 3.9–22.7)

## 2024-03-27 LAB — PSA: Prostate Specific Ag, Serum: 1 ng/mL (ref 0.0–4.0)

## 2024-03-27 LAB — LUTEINIZING HORMONE: LH: 0.6 m[IU]/mL — ABNORMAL LOW (ref 1.7–8.6)

## 2024-04-01 ENCOUNTER — Other Ambulatory Visit: Payer: Self-pay | Admitting: Physician Assistant

## 2024-04-01 ENCOUNTER — Encounter: Payer: Self-pay | Admitting: Urology

## 2024-04-01 ENCOUNTER — Encounter: Payer: Self-pay | Admitting: Physician Assistant

## 2024-04-01 ENCOUNTER — Ambulatory Visit (INDEPENDENT_AMBULATORY_CARE_PROVIDER_SITE_OTHER): Admitting: Physician Assistant

## 2024-04-01 VITALS — BP 131/84 | HR 112 | Ht 71.0 in | Wt 261.2 lb

## 2024-04-01 DIAGNOSIS — R7989 Other specified abnormal findings of blood chemistry: Secondary | ICD-10-CM

## 2024-04-01 DIAGNOSIS — D649 Anemia, unspecified: Secondary | ICD-10-CM | POA: Diagnosis not present

## 2024-04-01 DIAGNOSIS — K529 Noninfective gastroenteritis and colitis, unspecified: Secondary | ICD-10-CM | POA: Insufficient documentation

## 2024-04-01 DIAGNOSIS — E559 Vitamin D deficiency, unspecified: Secondary | ICD-10-CM | POA: Diagnosis not present

## 2024-04-01 DIAGNOSIS — E441 Mild protein-calorie malnutrition: Secondary | ICD-10-CM

## 2024-04-01 DIAGNOSIS — E538 Deficiency of other specified B group vitamins: Secondary | ICD-10-CM

## 2024-04-01 LAB — CBC WITH DIFFERENTIAL/PLATELET
Basophils Absolute: 0 10*3/uL (ref 0.0–0.1)
Basophils Relative: 0.3 % (ref 0.0–3.0)
Eosinophils Absolute: 0.1 10*3/uL (ref 0.0–0.7)
Eosinophils Relative: 1.2 % (ref 0.0–5.0)
HCT: 37.9 % — ABNORMAL LOW (ref 39.0–52.0)
Hemoglobin: 12.7 g/dL — ABNORMAL LOW (ref 13.0–17.0)
Lymphocytes Relative: 19.3 % (ref 12.0–46.0)
Lymphs Abs: 1 10*3/uL (ref 0.7–4.0)
MCHC: 33.5 g/dL (ref 30.0–36.0)
MCV: 104.1 fl — ABNORMAL HIGH (ref 78.0–100.0)
Monocytes Absolute: 0.6 10*3/uL (ref 0.1–1.0)
Monocytes Relative: 11.3 % (ref 3.0–12.0)
Neutro Abs: 3.6 10*3/uL (ref 1.4–7.7)
Neutrophils Relative %: 67.9 % (ref 43.0–77.0)
Platelets: 188 10*3/uL (ref 150.0–400.0)
RBC: 3.64 Mil/uL — ABNORMAL LOW (ref 4.22–5.81)
RDW: 15.1 % (ref 11.5–15.5)
WBC: 5.3 10*3/uL (ref 4.0–10.5)

## 2024-04-01 LAB — IBC + FERRITIN
Ferritin: 398.5 ng/mL — ABNORMAL HIGH (ref 22.0–322.0)
Iron: 56 ug/dL (ref 42–165)
Saturation Ratios: 22.1 % (ref 20.0–50.0)
TIBC: 253.4 ug/dL (ref 250.0–450.0)
Transferrin: 181 mg/dL — ABNORMAL LOW (ref 212.0–360.0)

## 2024-04-01 LAB — COMPREHENSIVE METABOLIC PANEL WITH GFR
ALT: 26 U/L (ref 0–53)
AST: 25 U/L (ref 0–37)
Albumin: 4 g/dL (ref 3.5–5.2)
Alkaline Phosphatase: 109 U/L (ref 39–117)
BUN: 12 mg/dL (ref 6–23)
CO2: 29 meq/L (ref 19–32)
Calcium: 9.1 mg/dL (ref 8.4–10.5)
Chloride: 105 meq/L (ref 96–112)
Creatinine, Ser: 1.03 mg/dL (ref 0.40–1.50)
GFR: 86.21 mL/min (ref >60.00)
Glucose, Bld: 90 mg/dL (ref 70–99)
Potassium: 4.4 meq/L (ref 3.5–5.1)
Sodium: 142 meq/L (ref 135–145)
Total Bilirubin: 0.5 mg/dL (ref 0.2–1.2)
Total Protein: 6.6 g/dL (ref 6.0–8.3)

## 2024-04-01 LAB — PHOSPHORUS: Phosphorus: 3.5 mg/dL (ref 2.3–4.6)

## 2024-04-01 LAB — MAGNESIUM: Magnesium: 1.1 mg/dL — ABNORMAL LOW (ref 1.5–2.5)

## 2024-04-01 LAB — VITAMIN B12: Vitamin B-12: 594 pg/mL (ref 211–911)

## 2024-04-01 LAB — VITAMIN D 25 HYDROXY (VIT D DEFICIENCY, FRACTURES): VITD: 62.39 ng/mL (ref 30.00–100.00)

## 2024-04-01 NOTE — Assessment & Plan Note (Signed)
 He reports long-standing gastrointestinal symptoms, including urgent bowel movements postprandially, leading to a restricted eating pattern and potential vitamin deficiencies. - Refer to gastroenterology for evaluation, including possible colonoscopy

## 2024-04-01 NOTE — Assessment & Plan Note (Signed)
 Low albumin, 2/2 to poor diet

## 2024-04-01 NOTE — Assessment & Plan Note (Addendum)
 Repeat level ,pt is supplementing. Possible cause of low calcium

## 2024-04-01 NOTE — Progress Notes (Signed)
 Established patient visit   Patient: Dylan Munoz.   DOB: [birth year]   48 y.o. Male  MRN: [id number] Visit Date: 04/01/2024  Today's healthcare provider: Alfredia Ferguson, PA-C   Cc. F/u   Subjective    Discussed the use of AI scribe software for clinical note transcription with the patient, who gave verbal consent to proceed.  History of Present Illness   The patient, with a history of anemia and multiple vitamin deficiencies, presents for a recheck after starting supplements. He was found to have low vitamin B12, vitamin D, calcium, and magnesium levels. The patient has been taking supplements to correct these deficiencies and is here for a recheck of his levels.  The patient reports eating only once a day, usually a meal of chicken or rice, due to a long-standing issue with bowel urgency following meals. 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 He reports no pain or blood in his stools, but does experience constipation at times.  The patient also mentions occasional hand cramping, particularly in his left hand which he uses for steering while driving. He also notes feeling lightheaded when bending down and standing up.       Medications: Outpatient Medications Prior to Visit  Medication Sig   acetaminophen (TYLENOL) 325 MG tablet Take 2 tablets (650 mg total) by mouth every 6 (six) hours as needed.   Alcohol Swabs (ALCOHOL PREP) PADS Use to clean skin and vials for testosterone injections.   AMBULATORY NON FORMULARY MEDICATION Please set CPAP to auto-titration from 10-20.  \  Adjust mask fit.  Send 30 day compliance report 30 days after change is made.   Send to [facility]   atorvastatin (LIPITOR) 80 MG tablet Take 1 tablet (80 mg total) by mouth daily.   Cholecalciferol 1.25 MG (50000 UT) TABS Take 1 tablet by mouth once a week.   escitalopram (LEXAPRO) 10 MG tablet TAKE 1 TABLET BY MOUTH EVERY  DAY. Needs appointment.   hydrOXYzine (VISTARIL) 25 MG capsule Take 1 capsule (25 mg total) by mouth every 8 (eight) hours as needed for anxiety.   losartan (COZAAR) 50 MG tablet Take 1 tablet (50 mg total) by mouth daily.   Magnesium Chloride 64 MG TABS Take 5 tablets by mouth daily. (Patient taking differently: Take 1 tablet by mouth daily.)   metFORMIN (GLUCOPHAGE-XR) 500 MG 24 hr tablet Take 1 tablet (500 mg total) by mouth daily with breakfast.   tirzepatide (MOUNJARO) 15 MG/0.5ML Pen Inject 15 mg into the skin once a week.   tiZANidine (ZANAFLEX) 4 MG tablet Take 1 tablet (4 mg total) by mouth every 6 (six) hours as needed for muscle spasms.   [DISCONTINUED] guaiFENesin-dextromethorphan (ROBITUSSIN DM) 100-10 MG/5ML syrup Take 5 mLs by mouth every 4 (four) hours as needed for cough.   No facility-administered medications prior to visit.    Review of Systems  Constitutional:  Negative for fatigue and fever.  Respiratory:  Negative for cough and shortness of breath.   Cardiovascular:  Negative for chest pain, palpitations and leg swelling.  Gastrointestinal:  Positive for diarrhea.  Musculoskeletal:  Positive for myalgias.  Neurological:  Positive for light-headedness. Negative for dizziness and headaches.       Objective    BP 131/84   Pulse (!) 112   Ht 5\' 11"  (1.803 m)   Wt 261 lb 3.2 oz (118.5 kg)   SpO2  99%   BMI 36.43 kg/m    Physical Exam Vitals reviewed.  Constitutional:      Appearance: He is not ill-appearing.  HENT:     Head: Normocephalic.  Eyes:     Conjunctiva/sclera: Conjunctivae normal.  Cardiovascular:     Rate and Rhythm: Normal rate.  Pulmonary:     Effort: Pulmonary effort is normal. No respiratory distress.  Neurological:     Mental Status: He is alert and oriented to person, place, and time.  Psychiatric:        Mood and Affect: Mood normal.        Behavior: Behavior normal.      No results found for any visits on 04/01/24.  Assessment &  Plan    Malnutrition of mild degree (HCC) Assessment & Plan: Low albumin, 2/2 to poor diet  Orders: -     Ambulatory referral to Gastroenterology  Chronic diarrhea Assessment & Plan: He reports long-standing gastrointestinal symptoms, including urgent bowel movements postprandially, leading to a restricted eating pattern and potential vitamin deficiencies. - Refer to gastroenterology for evaluation, including possible colonoscopy  Orders: -     Ambulatory referral to Gastroenterology  Hypomagnesemia Repeat level -     Magnesium -     Phosphorus  Vitamin D deficiency Assessment & Plan: Repeat level ,pt is supplementing. Possible cause of low calcium  Orders: -     Parathyroid hormone, intact (no Ca) -     VITAMIN D 25 Hydroxy (Vit-D Deficiency, Fractures)  Hypocalcemia -     Parathyroid hormone, intact (no Ca) -     Comprehensive metabolic panel with GFR -     Phosphorus -     PSA  Anemia, unspecified type Likely vitamin B12 def -     CBC with Differential/Platelet  Elevated ferritin -     IBC + Ferritin  B12 deficiency -     Vitamin B12 -     CBC with Differential/Platelet    Return if symptoms worsen or fail to improve.       Trenton Frock, PA-C  [facility] at [facility] [phone] (phone) [phone] (fax)  [facility]

## 2024-04-02 LAB — PARATHYROID HORMONE, INTACT (NO CA): PTH: 17 pg/mL (ref 16–77)

## 2024-04-06 ENCOUNTER — Encounter: Payer: Self-pay | Admitting: Urology

## 2024-04-06 MED ORDER — ANASTROZOLE 1 MG PO TABS: 1.0000 mg | ORAL_TABLET | ORAL | 11 refills | Status: DC

## 2024-04-06 NOTE — Addendum Note (Signed)
 Addended by: Mellie Sprinkle on: 04/06/2024 10:36 PM   Modules accepted: Orders

## 2024-04-08 ENCOUNTER — Encounter: Payer: Self-pay | Admitting: Physician Assistant

## 2024-04-09 ENCOUNTER — Encounter: Payer: Self-pay | Admitting: Gastroenterology

## 2024-04-13 ENCOUNTER — Other Ambulatory Visit: Payer: Self-pay | Admitting: Physician Assistant

## 2024-04-14 NOTE — Telephone Encounter (Signed)
 This request has been handled. No further action is required. Please review other telephone encounters for additional information.

## 2024-05-12 ENCOUNTER — Other Ambulatory Visit: Payer: Self-pay | Admitting: Family Medicine

## 2024-05-12 ENCOUNTER — Encounter: Payer: Self-pay | Admitting: Physician Assistant

## 2024-05-12 MED ORDER — HYDROXYZINE PAMOATE 25 MG PO CAPS: 25.0000 mg | ORAL_CAPSULE | Freq: Three times a day (TID) | ORAL | 3 refills | Status: DC | PRN

## 2024-05-18 ENCOUNTER — Telehealth: Payer: Self-pay | Admitting: Urology

## 2024-05-18 NOTE — Telephone Encounter (Signed)
 Pt called and lvm to schedule apt on 05/18/24. I called pt lvm , as well as sending a MyChart msg.

## 2024-05-19 ENCOUNTER — Other Ambulatory Visit: Payer: Self-pay | Admitting: *Deleted

## 2024-05-19 MED ORDER — MAG64 64 MG PO TBEC: 1.0000 | DELAYED_RELEASE_TABLET | Freq: Every day | ORAL | 0 refills | Status: DC

## 2024-06-04 ENCOUNTER — Ambulatory Visit: Admitting: Gastroenterology

## 2024-06-04 ENCOUNTER — Ambulatory Visit: Admitting: Urology

## 2024-06-04 ENCOUNTER — Encounter: Payer: Self-pay | Admitting: Pediatrics

## 2024-06-04 ENCOUNTER — Encounter: Payer: Self-pay | Admitting: Urology

## 2024-06-04 ENCOUNTER — Encounter: Payer: Self-pay | Admitting: Family Medicine

## 2024-06-04 ENCOUNTER — Ambulatory Visit: Payer: BC Managed Care – PPO | Admitting: Family Medicine

## 2024-06-04 VITALS — BP 118/83 | HR 108 | Ht 71.0 in | Wt 261.0 lb

## 2024-06-04 DIAGNOSIS — E291 Testicular hypofunction: Secondary | ICD-10-CM | POA: Diagnosis not present

## 2024-06-04 MED ORDER — TESTOSTERONE UNDECANOATE 750 MG/3ML IM SOLN
750.0000 mg | Freq: Once | INTRAMUSCULAR | Status: AC
Start: 2024-06-04 — End: 2024-06-04
  Administered 2024-06-04: 750 mg via INTRAMUSCULAR

## 2024-06-04 NOTE — Progress Notes (Signed)
   Assessment: 1. Hypogonadism in male      Plan: Aveed given today. Continue anastrozole  1 mg weekly. Return to office in 4 weeks for next Aveed injection.  Chief Complaint:  Chief Complaint  Patient presents with   Hypogonadism    History of Present Illness:  Dylan Munoz. is a 48 y.o. male who is seen for continued evaluation of hypogonadism. He has a history of hypogonadism.  He has been managed with IM testosterone  injections for a number of years.  Review of his chart would indicate this was started in 2017.  He was using a dose of 100 mg weekly.  Testosterone  level: 8/20 459 3/21 628 7/23 895 5/24 464 3/25 95  H&H 3/25 12/35.9 PSA 7/23 0.7  He has not received any testosterone  injections for approximately 6 months.  He reports symptoms of decreased energy, decreased libido, and erectile dysfunction as well as loss of muscle mass. He is not having any lower urinary tract symptoms.  He is not interested in maintaining his fertility.  ADAM =  8/10 (no to 4,10)  Labs from 4/25: PSA  1.0 Prolactin 12.9 LH  0.6 Estradiol  86.4  He was started on anastrozole  1 mg weekly in April 2025. He presents today to initiate testosterone  replacement therapy with Aveed.  Portions of the above documentation were copied from a prior visit for review purposes only.   Past Medical History:  Past Medical History:  Diagnosis Date   Anal fissure    Anemia    B12 deficiency    Diabetes (HCC)    type 2   H. pylori infection    High cholesterol    Hypertension    Hypomagnesemia    Morbid obesity (HCC) 04/17/2016   RSV (respiratory syncytial virus infection)    Severe obstructive sleep apnea 04/17/2016   AHI 14.9 with desats <88% for >90 mins on [facility] report 04/02/18. See scanned document.   Testosterone  deficiency 04/19/2016   Vitamin D  deficiency 04/19/2016    Past Surgical History:  Past Surgical History:  Procedure Laterality Date   KNEE ARTHROSCOPY W/ ACL  RECONSTRUCTION      Allergies:  Allergies  Allergen Reactions   Amlodipine  Other (See Comments)    Constipation    Family History:  Family History  Problem Relation Age of Onset   Heart disease Father    Diabetes Paternal Uncle    Colon cancer Neg Hx    Esophageal cancer Neg Hx     Social History:  Social History   Tobacco Use   Smoking status: Never   Smokeless tobacco: Current    Types: Snuff  Vaping Use   Vaping status: Never Used  Substance Use Topics   Alcohol  use: Not Currently    Comment: weekly   Drug use: No    ROS: Constitutional:  Negative for fever, chills, weight loss CV: Negative for chest pain, previous MI, hypertension Respiratory:  Negative for shortness of breath, wheezing, sleep apnea, frequent cough GI:  Negative for nausea, vomiting, bloody stool, GERD  Physical exam: BP 118/83   Pulse (!) 108   Ht 5' 11 (1.803 m)   Wt 261 lb (118.4 kg)   BMI 36.40 kg/m  GENERAL APPEARANCE:  Well appearing, well developed, well nourished, NAD   Results: None

## 2024-06-04 NOTE — Progress Notes (Deleted)
 Chief Complaint: Primary GI MD:  HPI:  *** is a  ***  who was referred to me by Trenton Frock, PA-C for a complaint of *** .    06/2019: History of acute pancreatitis with admission to [facility] and CT scan showing hypodense nodularity along the lateral wall of the cecum.  He underwent EGD 06/2019 which revealed mild erythema in antrum and small hiatal hernia with pathology showing intestinal metaplasia and positive H. pylori.  He declined colonoscopy.  He was prescribed Pylera but did not take it since it was unavailable at the pharmacy.  PCP gave him quadruple therapy 07/2019  He then underwent colonoscopy 08/2019 with recommendation to repeat in 10 years, we do not have report available.  Unclear if he had an H. pylori eradication study  Discussed the use of AI scribe software for clinical note transcription with the patient, who gave verbal consent to proceed.  History of Present Illness      PREVIOUS GI WORKUP     Past Medical History:  Diagnosis Date   Anal fissure    Anemia    B12 deficiency    Diabetes (HCC)    type 2   H. pylori infection    High cholesterol    Hypertension    Hypomagnesemia    Morbid obesity (HCC) 04/17/2016   RSV (respiratory syncytial virus infection)    Severe obstructive sleep apnea 04/17/2016   AHI 14.9 with desats <88% for >90 mins on SNAP report 04/02/18. See scanned document.   Testosterone  deficiency 04/19/2016   Vitamin D  deficiency 04/19/2016    Past Surgical History:  Procedure Laterality Date   KNEE ARTHROSCOPY W/ ACL RECONSTRUCTION      Current Outpatient Medications  Medication Sig Dispense Refill   acetaminophen  (TYLENOL ) 325 MG tablet Take 2 tablets (650 mg total) by mouth every 6 (six) hours as needed. 36 tablet 0   Alcohol  Swabs (ALCOHOL  PREP) PADS Use to clean skin and vials for testosterone  injections. 100 each prn   AMBULATORY NON FORMULARY MEDICATION Please set CPAP to auto-titration from 10-20.  \   Adjust mask fit.  Send 30 day compliance report 30 days after change is made.   Send to [facility] 1 each 0   anastrozole  (ARIMIDEX ) 1 MG tablet Take 1 tablet (1 mg total) by mouth once a week. 4 tablet 11   atorvastatin  (LIPITOR) 80 MG tablet Take 1 tablet (80 mg total) by mouth daily. 90 tablet 1   Cholecalciferol  1.25 MG (50000 UT) TABS Take 1 tablet by mouth once a week. 12 tablet 0   escitalopram  (LEXAPRO ) 10 MG tablet TAKE 1 TABLET BY MOUTH EVERY DAY. Needs appointment. 90 tablet 1   hydrOXYzine  (VISTARIL ) 25 MG capsule Take 1 capsule (25 mg total) by mouth every 8 (eight) hours as needed for anxiety. 30 capsule 3   losartan  (COZAAR ) 50 MG tablet Take 1 tablet (50 mg total) by mouth daily. 90 tablet 2   Magnesium  Chloride (MAG64) 64 MG TBEC Take 1 tablet (64 mg total) by mouth daily. 90 tablet 0   metFORMIN  (GLUCOPHAGE -XR) 500 MG 24 hr tablet Take 1 tablet (500 mg total) by mouth daily with breakfast. 90 tablet 2   tirzepatide  (MOUNJARO ) 15 MG/0.5ML Pen Inject 15 mg into the skin once a week. 6 mL 1   tiZANidine  (ZANAFLEX ) 4 MG tablet Take 1 tablet (4 mg total) by mouth every 6 (six) hours as needed for muscle spasms. 30 tablet 3  No current facility-administered medications for this visit.    Allergies as of 06/04/2024 - Review Complete 04/01/2024  Allergen Reaction Noted   Amlodipine  Other (See Comments) 04/22/2019    Family History  Problem Relation Age of Onset   Heart disease Father    Diabetes Paternal Uncle    Colon cancer Neg Hx    Esophageal cancer Neg Hx     Social History   Socioeconomic History   Marital status: Single    Spouse name: Not on file   Number of children: Not on file   Years of education: Not on file   Highest education level: Not on file  Occupational History   Not on file  Tobacco Use   Smoking status: Never   Smokeless tobacco: Current    Types: Snuff  Vaping Use   Vaping status: Never Used  Substance and Sexual Activity   Alcohol  use:  Not Currently    Comment: weekly   Drug use: No   Sexual activity: Yes  Other Topics Concern   Not on file  Social History Narrative   Not on file   Social Drivers of Health   Financial Resource Strain: Not on file  Food Insecurity: Not on file  Transportation Needs: Not on file  Physical Activity: Not on file  Stress: Not on file  Social Connections: Not on file  Intimate Partner Violence: Not on file    Review of Systems:    Constitutional: No weight loss, fever, chills, weakness or fatigue HEENT: Eyes: No change in vision               Ears, Nose, Throat:  No change in hearing or congestion Skin: No rash or itching Cardiovascular: No chest pain, chest pressure or palpitations   Respiratory: No SOB or cough Gastrointestinal: See HPI and otherwise negative Genitourinary: No dysuria or change in urinary frequency Neurological: No headache, dizziness or syncope Musculoskeletal: No new muscle or joint pain Hematologic: No bleeding or bruising Psychiatric: No history of depression or anxiety    Physical Exam:  Vital signs: There were no vitals taken for this visit.  Constitutional: NAD, alert and cooperative Head:  Normocephalic and atraumatic. Eyes:   PEERL, EOMI. No icterus. Conjunctiva pink. Respiratory: Respirations even and unlabored. Lungs clear to auscultation bilaterally.   No wheezes, crackles, or rhonchi.  Cardiovascular:  Regular rate and rhythm. No peripheral edema, cyanosis or pallor.  Gastrointestinal:  Soft, nondistended, nontender. No rebound or guarding. Normal bowel sounds. No appreciable masses or hepatomegaly. Rectal:  Declines Msk:  Symmetrical without gross deformities. Without edema, no deformity or joint abnormality.  Neurologic:  Alert and  oriented x4;  grossly normal neurologically.  Skin:   Dry and intact without significant lesions or rashes. Psychiatric: Oriented to person, place and time. Demonstrates good judgement and reason without  abnormal affect or behaviors.  Physical Exam    RELEVANT LABS AND IMAGING: CBC    Component Value Date/Time   WBC 5.3 04/01/2024 0918   RBC 3.64 (L) 04/01/2024 0918   HGB 12.7 (L) 04/01/2024 0918   HGB 12.0 (L) 02/20/2024 1409   HCT 37.9 (L) 04/01/2024 0918   HCT 35.9 (L) 02/20/2024 1409   PLT 188.0 04/01/2024 0918   PLT 275 02/20/2024 1409   MCV 104.1 (H) 04/01/2024 0918   MCV 101 (H) 02/20/2024 1409   MCH 33.7 (H) 02/20/2024 1409   MCH 32.5 12/11/2023 0448   MCHC 33.5 04/01/2024 0918   RDW 15.1 04/01/2024 0918  RDW 17.0 (H) 02/20/2024 1409   LYMPHSABS 1.0 04/01/2024 0918   LYMPHSABS 1.3 02/20/2024 1409   MONOABS 0.6 04/01/2024 0918   EOSABS 0.1 04/01/2024 0918   EOSABS 0.1 02/20/2024 1409   BASOSABS 0.0 04/01/2024 0918   BASOSABS 0.0 02/20/2024 1409    CMP     Component Value Date/Time   NA 142 04/01/2024 0918   NA 142 02/20/2024 1409   K 4.4 04/01/2024 0918   CL 105 04/01/2024 0918   CO2 29 04/01/2024 0918   GLUCOSE 90 04/01/2024 0918   BUN 12 04/01/2024 0918   BUN 11 02/20/2024 1409   CREATININE 1.03 04/01/2024 0918   CREATININE 1.09 04/18/2023 1110   CALCIUM  9.1 04/01/2024 0918   PROT 6.6 04/01/2024 0918   PROT 6.9 02/20/2024 1409   ALBUMIN 4.0 04/01/2024 0918   ALBUMIN 4.0 (L) 02/20/2024 1409   AST 25 04/01/2024 0918   ALT 26 04/01/2024 0918   ALKPHOS 109 04/01/2024 0918   BILITOT 0.5 04/01/2024 0918   BILITOT 1.6 (H) 02/20/2024 1409   GFRNONAA >60 12/11/2023 0448   GFRNONAA 95 03/31/2020 1359   GFRAA 110 03/31/2020 1359     Assessment/Plan:   Assessment and Plan Assessment & Plan     History of H pylori History of H pylori via EGD 07/2019 with [facility] treated with quadruple therapy and no evidence of eradication study. Also possible intestinal metaplasia on EGD -- repeat EGD with biopsies  Colon cancer screening Colonoscopy 08/2019 completed for CT scan showing nodularity in cecum. No report available but notation of benign biopsies and  recall of 10 years.   Suzanna Erp, PA-C [facility] 06/04/2024, 7:58 AM  Cc: Trenton Frock, PA-C

## 2024-06-04 NOTE — Progress Notes (Signed)
 Aveed Injection  Due to Hypogonadism patient is present today for a Aveed Injection.  Medication: Aveed Dose: 750mg / 3ml [id number] Exp:12/2027 Location: left upper outer buttocks Injection of medication was given over a 60 second period of time.  Patient tolerated well, no complications were noted Patient remained in the office for 30 minutes post injection for monitoring and will report any side effects.  Performed by: C. Candee Cha, LPN

## 2024-07-02 ENCOUNTER — Encounter: Payer: Self-pay | Admitting: Urology

## 2024-07-02 ENCOUNTER — Ambulatory Visit: Admitting: Urology

## 2024-07-02 VITALS — BP 134/91 | HR 92 | Ht 71.0 in | Wt 261.0 lb

## 2024-07-02 DIAGNOSIS — E291 Testicular hypofunction: Secondary | ICD-10-CM

## 2024-07-02 MED ORDER — TESTOSTERONE UNDECANOATE 750 MG/3ML IM SOLN: Freq: Once | INTRAMUSCULAR | Status: AC

## 2024-07-02 NOTE — Progress Notes (Signed)
 Aveed  Injection  Due to Hypogonadism patient is present today for a Aveed  Injection.  Medication: Aveed  Dose: 750mg / 3ml [id number] Exp:12/2027 Location: right upper outer buttocks Injection of medication was given over a 60 second period of time.  Patient tolerated well, no complications were noted Patient remained in the office for 30 minutes post injection for monitoring and will report any side effects.  Performed by: Salome Hautala CMA

## 2024-07-02 NOTE — Progress Notes (Signed)
   Assessment: 1. Hypogonadism in male     Plan: Aveed  #2 given today. Continue anastrozole  1 mg weekly. Return to office in 10 weeks for next Aveed  injection. Will check labs 5 weeks after next injection (testosterone , CBC)  Chief Complaint:  Chief Complaint  Patient presents with   Hypogonadism    History of Present Illness:  Dylan Munoz. is a 49 y.o. male who is seen for continued evaluation of hypogonadism. He has a history of hypogonadism.  He has been managed with IM testosterone  injections for a number of years.  Review of his chart would indicate this was started in 2017.  He was using a dose of 100 mg weekly.  Testosterone  level: 8/20 459 3/21 628 7/23 895 5/24 464 3/25 95  H&H 3/25 12/35.9 PSA 7/23 0.7  He has not received any testosterone  injections for approximately 6 months.  He reports symptoms of decreased energy, decreased libido, and erectile dysfunction as well as loss of muscle mass. He is not having any lower urinary tract symptoms.  He is not interested in maintaining his fertility.  ADAM =  8/10 (no to 4,10)  Labs from 4/25: PSA  1.0 Prolactin 12.9 LH  0.6 Estradiol  86.4  He was started on anastrozole  1 mg weekly in April 2025. He received his first dose of Aveed  on 06/04/24. He presents for continued testosterone  replacement therapy with Aveed . No problems following his last injection.  He has noted some improvement in his symptoms already.  Portions of the above documentation were copied from a prior visit for review purposes only.   Past Medical History:  Past Medical History:  Diagnosis Date   Anal fissure    Anemia    B12 deficiency    Diabetes (HCC)    type 2   H. pylori infection    High cholesterol    Hypertension    Hypomagnesemia    Morbid obesity (HCC) 04/17/2016   RSV (respiratory syncytial virus infection)    Severe obstructive sleep apnea 04/17/2016   AHI 14.9 with desats <88% for >90 mins on SNAP report  04/02/18. See scanned document.   Testosterone  deficiency 04/19/2016   Vitamin D  deficiency 04/19/2016    Past Surgical History:  Past Surgical History:  Procedure Laterality Date   KNEE ARTHROSCOPY W/ ACL RECONSTRUCTION      Allergies:  Allergies  Allergen Reactions   Amlodipine  Other (See Comments)    Constipation    Family History:  Family History  Problem Relation Age of Onset   Heart disease Father    Diabetes Paternal Uncle    Colon cancer Neg Hx    Esophageal cancer Neg Hx     Social History:  Social History   Tobacco Use   Smoking status: Never   Smokeless tobacco: Current    Types: Snuff  Vaping Use   Vaping status: Never Used  Substance Use Topics   Alcohol  use: Not Currently    Comment: weekly   Drug use: No    ROS: Constitutional:  Negative for fever, chills, weight loss CV: Negative for chest pain, previous MI, hypertension Respiratory:  Negative for shortness of breath, wheezing, sleep apnea, frequent cough GI:  Negative for nausea, vomiting, bloody stool, GERD  Physical exam: BP (!) 134/91   Pulse 92   Ht 5' 11 (1.803 m)   Wt 261 lb (118.4 kg)   BMI 36.40 kg/m  GENERAL APPEARANCE:  Well appearing, well developed, well nourished, NAD   Results: None

## 2024-07-09 ENCOUNTER — Ambulatory Visit: Payer: Self-pay

## 2024-07-09 ENCOUNTER — Ambulatory Visit: Admitting: Physician Assistant

## 2024-07-09 NOTE — Telephone Encounter (Signed)
 Pt canceled appt with pcp for this issue.

## 2024-07-09 NOTE — Telephone Encounter (Signed)
 Routing to office as patient is denying Triage.       Message from [place] J sent at 07/09/2024 10:21 AM EDT  Patient is denying nurse triage. He is having neck and shoulder pain. He mentioned the current pain after I had scheduled the appointment.

## 2024-07-23 ENCOUNTER — Ambulatory Visit: Admitting: Physician Assistant

## 2024-07-23 ENCOUNTER — Encounter: Payer: Self-pay | Admitting: Physician Assistant

## 2024-07-23 ENCOUNTER — Ambulatory Visit: Payer: Self-pay | Admitting: Physician Assistant

## 2024-07-23 VITALS — BP 158/96 | HR 111 | Ht 71.0 in | Wt 253.8 lb

## 2024-07-23 DIAGNOSIS — E559 Vitamin D deficiency, unspecified: Secondary | ICD-10-CM

## 2024-07-23 DIAGNOSIS — R413 Other amnesia: Secondary | ICD-10-CM

## 2024-07-23 DIAGNOSIS — E538 Deficiency of other specified B group vitamins: Secondary | ICD-10-CM

## 2024-07-23 DIAGNOSIS — D649 Anemia, unspecified: Secondary | ICD-10-CM

## 2024-07-23 DIAGNOSIS — E611 Iron deficiency: Secondary | ICD-10-CM

## 2024-07-23 DIAGNOSIS — S161XXA Strain of muscle, fascia and tendon at neck level, initial encounter: Secondary | ICD-10-CM

## 2024-07-23 LAB — IBC + FERRITIN
Ferritin: 463.4 ng/mL — ABNORMAL HIGH (ref 22.0–322.0)
Iron: 140 ug/dL (ref 42–165)
Saturation Ratios: 47.6 % (ref 20.0–50.0)
TIBC: 294 ug/dL (ref 250.0–450.0)
Transferrin: 210 mg/dL — ABNORMAL LOW (ref 212.0–360.0)

## 2024-07-23 LAB — CBC WITH DIFFERENTIAL/PLATELET
Basophils Absolute: 0 K/uL (ref 0.0–0.1)
Basophils Relative: 0.3 % (ref 0.0–3.0)
Eosinophils Absolute: 0.1 K/uL (ref 0.0–0.7)
Eosinophils Relative: 2.7 % (ref 0.0–5.0)
HCT: 36.3 % — ABNORMAL LOW (ref 39.0–52.0)
Hemoglobin: 12.2 g/dL — ABNORMAL LOW (ref 13.0–17.0)
Lymphocytes Relative: 36.3 % (ref 12.0–46.0)
Lymphs Abs: 1.4 K/uL (ref 0.7–4.0)
MCHC: 33.6 g/dL (ref 30.0–36.0)
MCV: 98.2 fl (ref 78.0–100.0)
Monocytes Absolute: 0.4 K/uL (ref 0.1–1.0)
Monocytes Relative: 9.5 % (ref 3.0–12.0)
Neutro Abs: 1.9 K/uL (ref 1.4–7.7)
Neutrophils Relative %: 51.2 % (ref 43.0–77.0)
Platelets: 247 K/uL (ref 150.0–400.0)
RBC: 3.7 Mil/uL — ABNORMAL LOW (ref 4.22–5.81)
RDW: 14.4 % (ref 11.5–15.5)
WBC: 3.7 K/uL — ABNORMAL LOW (ref 4.0–10.5)

## 2024-07-23 LAB — MAGNESIUM: Magnesium: 1.1 mg/dL — ABNORMAL LOW (ref 1.5–2.5)

## 2024-07-23 LAB — FOLATE: Folate: 6.3 ng/mL (ref >5.9)

## 2024-07-23 LAB — VITAMIN D 25 HYDROXY (VIT D DEFICIENCY, FRACTURES): VITD: 96.59 ng/mL (ref 30.00–100.00)

## 2024-07-23 LAB — VITAMIN B12: Vitamin B-12: 526 pg/mL (ref 211–911)

## 2024-07-23 MED ORDER — DICLOFENAC SODIUM 1 % EX GEL: CUTANEOUS | 0 refills | Status: DC

## 2024-07-23 MED ORDER — BACLOFEN 10 MG PO TABS: 10.0000 mg | ORAL_TABLET | Freq: Three times a day (TID) | ORAL | 0 refills | Status: DC

## 2024-07-23 MED ORDER — MAG64 64 MG PO TBEC: 1.0000 | DELAYED_RELEASE_TABLET | Freq: Three times a day (TID) | ORAL | 0 refills | Status: DC

## 2024-07-23 MED ORDER — MELOXICAM 15 MG PO TABS: 15.0000 mg | ORAL_TABLET | Freq: Every day | ORAL | 0 refills | Status: DC

## 2024-07-23 NOTE — Progress Notes (Signed)
 Established patient visit   Patient: Dylan Munoz.   DOB: April 07, 1976   48 y.o. Male  MRN: [id number] Visit Date: 07/23/2024  Today's healthcare provider: Manuelita Flatness, PA-C   Cc. Left sided neck/shoulder pain  Subjective     Discussed the use of AI scribe software for clinical note transcription with the patient, who gave verbal consent to proceed.  History of Present Illness   Dylan Blahnik. is a 48 year old male who presents with left-sided neck pain radiating down the arm for two months.  The neck pain began after lifting heavy objects at work, with an incident where he felt like he 'pulled something.' He describes the neck as stiff, with pain extending to the mid-arm and back, causing difficulty lifting objects like a 20-pound weight. There is no numbness or tingling in the left arm. Using topical diclofenac   He also experiences memory issues, describing his memory as 'foggy' and sometimes forgetting conversations, though the memories return later. There are no issues with getting lost while driving, but he admits to frequent daydreaming and distraction. Denies trouble word finding.      Medications: Outpatient Medications Prior to Visit  Medication Sig   acetaminophen  (TYLENOL ) 325 MG tablet Take 2 tablets (650 mg total) by mouth every 6 (six) hours as needed.   Alcohol  Swabs (ALCOHOL  PREP) PADS Use to clean skin and vials for testosterone  injections.   AMBULATORY NON FORMULARY MEDICATION Please set CPAP to auto-titration from 10-20.  \  Adjust mask fit.  Send 30 day compliance report 30 days after change is made.   Send to [facility]   anastrozole  (ARIMIDEX ) 1 MG tablet Take 1 tablet (1 mg total) by mouth once a week.   atorvastatin  (LIPITOR) 80 MG tablet Take 1 tablet (80 mg total) by mouth daily.   Cholecalciferol  1.25 MG (50000 UT) TABS Take 1 tablet by mouth once a week.   escitalopram  (LEXAPRO ) 10 MG tablet TAKE 1 TABLET BY MOUTH EVERY DAY. Needs  appointment.   hydrOXYzine  (VISTARIL ) 25 MG capsule Take 1 capsule (25 mg total) by mouth every 8 (eight) hours as needed for anxiety.   losartan  (COZAAR ) 50 MG tablet Take 1 tablet (50 mg total) by mouth daily.   Magnesium  Chloride (MAG64) 64 MG TBEC Take 1 tablet (64 mg total) by mouth daily.   metFORMIN  (GLUCOPHAGE -XR) 500 MG 24 hr tablet Take 1 tablet (500 mg total) by mouth daily with breakfast.   tirzepatide  (MOUNJARO ) 15 MG/0.5ML Pen Inject 15 mg into the skin once a week.   [DISCONTINUED] tiZANidine  (ZANAFLEX ) 4 MG tablet Take 1 tablet (4 mg total) by mouth every 6 (six) hours as needed for muscle spasms.   No facility-administered medications prior to visit.    Review of Systems  Constitutional:  Negative for fatigue and fever.  Respiratory:  Negative for cough and shortness of breath.   Cardiovascular:  Negative for chest pain, palpitations and leg swelling.  Musculoskeletal:  Positive for neck pain.  Neurological:  Negative for dizziness and headaches.       Objective    BP (!) 158/96   Pulse (!) 111   Ht 5' 11 (1.803 m)   Wt 253 lb 12.8 oz (115.1 kg)   BMI 35.40 kg/m    Physical Exam Vitals reviewed.  Constitutional:      Appearance: He is not ill-appearing.  HENT:     Head: Normocephalic.  Eyes:     Conjunctiva/sclera: Conjunctivae normal.  Neck:     Comments: Limited extension and rotation 2/2 to pain.  Tender along L upper trap.   Cardiovascular:     Rate and Rhythm: Normal rate.  Pulmonary:     Effort: Pulmonary effort is normal. No respiratory distress.  Neurological:     Mental Status: He is alert and oriented to person, place, and time.  Psychiatric:        Mood and Affect: Mood normal.        Behavior: Behavior normal.      No results found for any visits on 07/23/24.  Assessment & Plan    Strain of neck muscle, initial encounter - Advise use of Tylenol  for additional pain relief, rx mobic  daily, avoid other nsaid use. Ok for bid prn  topical voltaren . Rx baclofen  up to TID cautioned drowsiness.  - Recommend alternating heat and ice application to affected area. - Advise rest and avoidance of activities that exacerbate pain. - Instruct to contact if no improvement in a few days for potential imaging (x-ray) to assess cervical spine. -     Baclofen ; Take 1 tablet (10 mg total) by mouth 3 (three) times daily.  Dispense: 30 each; Refill: 0 -     Meloxicam ; Take 1 tablet (15 mg total) by mouth daily.  Dispense: 30 tablet; Refill: 0 -     Diclofenac  Sodium; Apply a small amount as needed up to twice daily to area of concern  Dispense: 150 g; Refill: 0  Hypomagnesemia -     Magnesium   Vitamin D  deficiency -     VITAMIN D  25 Hydroxy (Vit-D Deficiency, Fractures)  Iron deficiency -     IBC + Ferritin -     CBC with Differential/Platelet  Folate deficiency -     Folate  Vitamin B12 deficiency -     Vitamin B12  Memory impairment  - Discuss potential referral to neurology for further evaluation if symptoms persist or worsen.   Return if symptoms worsen or fail to improve.       Manuelita Flatness, PA-C  [facility] at [facility] [phone] (phone) [phone] (fax)  [facility]

## 2024-07-30 ENCOUNTER — Ambulatory Visit: Admitting: Pediatrics

## 2024-08-02 ENCOUNTER — Other Ambulatory Visit: Payer: Self-pay | Admitting: Physician Assistant

## 2024-08-02 DIAGNOSIS — E1165 Type 2 diabetes mellitus with hyperglycemia: Secondary | ICD-10-CM

## 2024-08-05 NOTE — Progress Notes (Deleted)
 08/05/2024 Dylan Munoz [id number] 05-Apr-1976  Referring provider: Cyndi Shaver, PA-C Primary GI doctor: {[id number]}  ASSESSMENT AND PLAN:  Chronic diarrhea 01/04/2019 CT AP W mild diverticula change in colon without diverticulitis fatty liver, normal gallbladder pancreas and spleen 08/2019 colonoscopy at [facility] unremarkable recall 10 years unable to see report  B12 deficiency, continuing normocytic anemia, normal iron, elevated ferritin likely inflammatory reaction 07/23/2024  HGB 12.2 MCV 98.2 Platelets 247.0 07/23/2024 Iron 140 Ferritin 463.4 B12 526 Recent Labs    12/11/23 0448 02/20/24 0000 02/20/24 1409 04/01/24 0918 07/23/24 0831  HGB 13.8 12.0* 12.0* 12.7* 12.2*   Fatty liver Seen on CTAP W1/20/2020    Latest Ref Rng & Units 04/01/2024    9:18 AM 02/20/2024    2:09 PM 02/20/2024   12:00 AM  Hepatic Function  Total Protein 6.0 - 8.3 g/dL 6.6  6.9    Albumin 3.5 - 5.2 g/dL 4.0  4.0  4.0      AST 0 - 37 U/L 25  32    ALT 0 - 53 U/L 26  21    Alk Phosphatase 39 - 117 U/L 109  100    Total Bilirubin 0.2 - 1.2 mg/dL 0.5  1.6       This result is from an external source.   Platelets 247.0  FIB 4 *** Patients less than 1.45% are low risk, counsel and monitor.  Patients above 3.25% need referral for evaluation.  Patients between 1.45 and 3.25 can get fibroscan/evaluate.  - Check liver enzymes - If enzymes elevated, may consider additional serologic work-up to rule out concomitant liver disease - Depending on above evaluation and FIB-4, can consider ultrasound elastography to follow-up  - need LFTs and CBC monitored every 6 months, - evaluation with imaging every 2-3 years.  - Encouraged diet/exercise for modest 10% body weight loss as treatment for hepatic steatosis -Continue to work on risk factor modification including diet exercise and control of risk factors including blood sugars. - cessation of alcohol   GERD EGD on 07/08/2019 which revealed mild  erythema in the antrum and a small HH. Path showed benign stomach inflammation, intestinal metaplasia and was positive for H Pylori  09/13/2019 H. pylori eradication study negative  History of acute pancreatitis 2020 in setting of alcohol  use    Patient Care Team: Cyndi Shaver RIGGERS as PCP - General (Physician Assistant)  HISTORY OF PRESENT ILLNESS: 48 y.o. male with a past medical history of OSA, type 2 diabetes, obesity, hyperlipidemia, anxiety, B12 deficiency, alcohol  abuse, H. pylori infection listed below presents for evaluation of chronic diarrhea.   *** Discussed the use of AI scribe software for clinical note transcription with the patient, who gave verbal consent to proceed.  History of Present Illness            He  reports that he has never smoked. His smokeless tobacco use includes snuff. He reports that he does not currently use alcohol . He reports that he does not use drugs.  RELEVANT GI HISTORY, IMAGING AND LABS: Results          CBC    Component Value Date/Time   WBC 3.7 (L) 07/23/2024 0831   RBC 3.70 (L) 07/23/2024 0831   HGB 12.2 (L) 07/23/2024 0831   HGB 12.0 (L) 02/20/2024 1409   HCT 36.3 (L) 07/23/2024 0831   HCT 35.9 (L) 02/20/2024 1409   PLT 247.0 07/23/2024 0831   PLT 275 02/20/2024 1409   MCV 98.2 07/23/2024  0831   MCV 101 (H) 02/20/2024 1409   MCH 33.7 (H) 02/20/2024 1409   MCH 32.5 12/11/2023 0448   MCHC 33.6 07/23/2024 0831   RDW 14.4 07/23/2024 0831   RDW 17.0 (H) 02/20/2024 1409   LYMPHSABS 1.4 07/23/2024 0831   LYMPHSABS 1.3 02/20/2024 1409   MONOABS 0.4 07/23/2024 0831   EOSABS 0.1 07/23/2024 0831   EOSABS 0.1 02/20/2024 1409   BASOSABS 0.0 07/23/2024 0831   BASOSABS 0.0 02/20/2024 1409   Recent Labs    12/11/23 0448 02/20/24 0000 02/20/24 1409 04/01/24 0918 07/23/24 0831  HGB 13.8 12.0* 12.0* 12.7* 12.2*    CMP     Component Value Date/Time   NA 142 04/01/2024 0918   NA 142 02/20/2024 1409   K 4.4 04/01/2024 0918    CL 105 04/01/2024 0918   CO2 29 04/01/2024 0918   GLUCOSE 90 04/01/2024 0918   BUN 12 04/01/2024 0918   BUN 11 02/20/2024 1409   CREATININE 1.03 04/01/2024 0918   CREATININE 1.09 04/18/2023 1110   CALCIUM  9.1 04/01/2024 0918   PROT 6.6 04/01/2024 0918   PROT 6.9 02/20/2024 1409   ALBUMIN 4.0 04/01/2024 0918   ALBUMIN 4.0 (L) 02/20/2024 1409   AST 25 04/01/2024 0918   ALT 26 04/01/2024 0918   ALKPHOS 109 04/01/2024 0918   BILITOT 0.5 04/01/2024 0918   BILITOT 1.6 (H) 02/20/2024 1409   GFRNONAA >60 12/11/2023 0448   GFRNONAA 95 03/31/2020 1359   GFRAA 110 03/31/2020 1359      Latest Ref Rng & Units 04/01/2024    9:18 AM 02/20/2024    2:09 PM 02/20/2024   12:00 AM  Hepatic Function  Total Protein 6.0 - 8.3 g/dL 6.6  6.9    Albumin 3.5 - 5.2 g/dL 4.0  4.0  4.0      AST 0 - 37 U/L 25  32    ALT 0 - 53 U/L 26  21    Alk Phosphatase 39 - 117 U/L 109  100    Total Bilirubin 0.2 - 1.2 mg/dL 0.5  1.6       This result is from an external source.      Current Medications:   Current Outpatient Medications (Endocrine & Metabolic):    metFORMIN  (GLUCOPHAGE -XR) 500 MG 24 hr tablet, Take 1 tablet (500 mg total) by mouth daily with breakfast.   tirzepatide  (MOUNJARO ) 15 MG/0.5ML Pen, INJECT 15 MG INTO THE SKIN ONCE A WEEK.  Current Outpatient Medications (Cardiovascular):    atorvastatin  (LIPITOR) 80 MG tablet, Take 1 tablet (80 mg total) by mouth daily.   losartan  (COZAAR ) 50 MG tablet, Take 1 tablet (50 mg total) by mouth daily.   Current Outpatient Medications (Analgesics):    acetaminophen  (TYLENOL ) 325 MG tablet, Take 2 tablets (650 mg total) by mouth every 6 (six) hours as needed.   meloxicam  (MOBIC ) 15 MG tablet, Take 1 tablet (15 mg total) by mouth daily.   Current Outpatient Medications (Other):    Alcohol  Swabs (ALCOHOL  PREP) PADS, Use to clean skin and vials for testosterone  injections.   AMBULATORY NON FORMULARY MEDICATION, Please set CPAP to auto-titration from  10-20.  \  Adjust mask fit.  Send 30 day compliance report 30 days after change is made.   Send to [facility]   anastrozole  (ARIMIDEX ) 1 MG tablet, Take 1 tablet (1 mg total) by mouth once a week.   baclofen  (LIORESAL ) 10 MG tablet, Take 1 tablet (10 mg total) by mouth 3 (  three) times daily.   Cholecalciferol  1.25 MG (50000 UT) TABS, Take 1 tablet by mouth once a week.   diclofenac  Sodium (VOLTAREN ) 1 % GEL, Apply a small amount as needed up to twice daily to area of concern   escitalopram  (LEXAPRO ) 10 MG tablet, TAKE 1 TABLET BY MOUTH EVERY DAY. Needs appointment.   hydrOXYzine  (VISTARIL ) 25 MG capsule, Take 1 capsule (25 mg total) by mouth every 8 (eight) hours as needed for anxiety.   Magnesium  Chloride (MAG64) 64 MG TBEC, Take 1 tablet (64 mg total) by mouth 3 (three) times daily.  Medical History:  Past Medical History:  Diagnosis Date   Anal fissure    Anemia    B12 deficiency    Diabetes (HCC)    type 2   H. pylori infection    High cholesterol    Hypertension    Hypomagnesemia    Morbid obesity (HCC) 04/17/2016   RSV (respiratory syncytial virus infection)    Severe obstructive sleep apnea 04/17/2016   AHI 14.9 with desats <88% for >90 mins on SNAP report 04/02/18. See scanned document.   Testosterone  deficiency 04/19/2016   Vitamin D  deficiency 04/19/2016   Allergies:  Allergies  Allergen Reactions   Amlodipine  Other (See Comments)    Constipation     Surgical History:  He  has a past surgical history that includes Knee arthroscopy w/ ACL reconstruction. Family History:  His family history includes Diabetes in his paternal uncle; Heart disease in his father.  REVIEW OF SYSTEMS  : All other systems reviewed and negative except where noted in the History of Present Illness.  PHYSICAL EXAM: There were no vitals taken for this visit. Physical Exam          Alan JONELLE Coombs, PA-C 8:23 AM

## 2024-08-06 ENCOUNTER — Ambulatory Visit: Admitting: Physician Assistant

## 2024-08-06 ENCOUNTER — Other Ambulatory Visit: Payer: Self-pay | Admitting: Physician Assistant

## 2024-08-06 DIAGNOSIS — E559 Vitamin D deficiency, unspecified: Secondary | ICD-10-CM

## 2024-08-25 ENCOUNTER — Other Ambulatory Visit: Payer: Self-pay | Admitting: Family

## 2024-08-25 DIAGNOSIS — S161XXA Strain of muscle, fascia and tendon at neck level, initial encounter: Secondary | ICD-10-CM

## 2024-08-25 MED ORDER — BACLOFEN 10 MG PO TABS: 10.0000 mg | ORAL_TABLET | Freq: Three times a day (TID) | ORAL | 1 refills | Status: DC

## 2024-08-25 MED ORDER — HYDROXYZINE PAMOATE 25 MG PO CAPS: 25.0000 mg | ORAL_CAPSULE | Freq: Three times a day (TID) | ORAL | 3 refills | Status: DC | PRN

## 2024-08-25 MED ORDER — MELOXICAM 15 MG PO TABS: 15.0000 mg | ORAL_TABLET | Freq: Every day | ORAL | 3 refills | Status: DC

## 2024-08-27 ENCOUNTER — Other Ambulatory Visit (INDEPENDENT_AMBULATORY_CARE_PROVIDER_SITE_OTHER)

## 2024-08-27 ENCOUNTER — Other Ambulatory Visit: Payer: Self-pay | Admitting: Family Medicine

## 2024-08-27 DIAGNOSIS — D649 Anemia, unspecified: Secondary | ICD-10-CM | POA: Diagnosis not present

## 2024-08-27 LAB — CBC WITH DIFFERENTIAL/PLATELET
Basophils Absolute: 0 K/uL (ref 0.0–0.1)
Basophils Relative: 0.5 % (ref 0.0–3.0)
Eosinophils Absolute: 0.1 K/uL (ref 0.0–0.7)
Eosinophils Relative: 2.1 % (ref 0.0–5.0)
HCT: 37 % — ABNORMAL LOW (ref 39.0–52.0)
Hemoglobin: 12.4 g/dL — ABNORMAL LOW (ref 13.0–17.0)
Lymphocytes Relative: 32 % (ref 12.0–46.0)
Lymphs Abs: 1.2 K/uL (ref 0.7–4.0)
MCHC: 33.6 g/dL (ref 30.0–36.0)
MCV: 98.9 fl (ref 78.0–100.0)
Monocytes Absolute: 0.4 K/uL (ref 0.1–1.0)
Monocytes Relative: 10.4 % (ref 3.0–12.0)
Neutro Abs: 2.1 K/uL (ref 1.4–7.7)
Neutrophils Relative %: 55 % (ref 43.0–77.0)
Platelets: 243 K/uL (ref 150.0–400.0)
RBC: 3.74 Mil/uL — ABNORMAL LOW (ref 4.22–5.81)
RDW: 14.4 % (ref 11.5–15.5)
WBC: 3.8 K/uL — ABNORMAL LOW (ref 4.0–10.5)

## 2024-08-27 LAB — MAGNESIUM: Magnesium: 1.3 mg/dL — ABNORMAL LOW (ref 1.5–2.5)

## 2024-08-30 ENCOUNTER — Other Ambulatory Visit: Payer: Self-pay

## 2024-08-30 ENCOUNTER — Encounter: Payer: Self-pay | Admitting: Student

## 2024-08-30 ENCOUNTER — Emergency Department (HOSPITAL_BASED_OUTPATIENT_CLINIC_OR_DEPARTMENT_OTHER)
Admission: EM | Admit: 2024-08-30 | Discharge: 2024-08-30 | Disposition: A | Discharge status: Home / Self Care | Attending: Emergency Medicine | Admitting: Emergency Medicine

## 2024-08-30 ENCOUNTER — Encounter (HOSPITAL_BASED_OUTPATIENT_CLINIC_OR_DEPARTMENT_OTHER): Payer: Self-pay | Admitting: Emergency Medicine

## 2024-08-30 ENCOUNTER — Emergency Department (HOSPITAL_BASED_OUTPATIENT_CLINIC_OR_DEPARTMENT_OTHER)

## 2024-08-30 DIAGNOSIS — S46812A Strain of other muscles, fascia and tendons at shoulder and upper arm level, left arm, initial encounter: Secondary | ICD-10-CM | POA: Insufficient documentation

## 2024-08-30 DIAGNOSIS — M25512 Pain in left shoulder: Secondary | ICD-10-CM | POA: Diagnosis not present

## 2024-08-30 DIAGNOSIS — Y990 Civilian activity done for income or pay: Secondary | ICD-10-CM | POA: Insufficient documentation

## 2024-08-30 DIAGNOSIS — S46912A Strain of unspecified muscle, fascia and tendon at shoulder and upper arm level, left arm, initial encounter: Secondary | ICD-10-CM | POA: Diagnosis not present

## 2024-08-30 DIAGNOSIS — S161XXA Strain of muscle, fascia and tendon at neck level, initial encounter: Secondary | ICD-10-CM

## 2024-08-30 DIAGNOSIS — X500XXA Overexertion from strenuous movement or load, initial encounter: Secondary | ICD-10-CM | POA: Insufficient documentation

## 2024-08-30 MED ORDER — ATORVASTATIN CALCIUM 80 MG PO TABS: 80.0000 mg | ORAL_TABLET | Freq: Every day | ORAL | 1 refills | Status: DC

## 2024-08-30 MED ORDER — MELOXICAM 15 MG PO TABS: 15.0000 mg | ORAL_TABLET | Freq: Every day | ORAL | 3 refills | Status: DC

## 2024-08-30 MED ORDER — ACETAMINOPHEN 500 MG PO TABS
1000.0000 mg | ORAL_TABLET | Freq: Once | ORAL | Status: AC
  Administered 2024-08-30: 1000 mg via ORAL
  Filled 2024-08-30: qty 2

## 2024-08-30 MED ORDER — HYDROXYZINE PAMOATE 25 MG PO CAPS: 25.0000 mg | ORAL_CAPSULE | Freq: Three times a day (TID) | ORAL | 3 refills | Status: AC | PRN

## 2024-08-30 NOTE — Discharge Instructions (Addendum)
 Work note provided for few days.  Follow-up with your Worker's Comp. physician and I also provided a sports medicine doctor.  You may need physical therapy or in the future and MRI.   Return for chest pain, shortness of breath, weakness or numbness in your arms or legs, fevers or new concerns.   Use Tylenol  every 4 hours and if tolerated ibuprofen every 6 as needed for pain. Heat pad as needed.

## 2024-08-30 NOTE — ED Provider Notes (Signed)
 [facility]PARTMENT AT [facility] Provider Note   CSN: [id number] Arrival date & time: 08/30/24  9250     Patient presents with: Shoulder Injury   Dylan Munoz. is a 48 y.o. male.   Patient presents with intermittent left shoulder discomfort achy sensation for the last 3 months since lifting heavy object at work.  No direct trauma.  Pain comes and goes and sometimes hurts when he sleeps.  No chest pain or shortness of breath.  No neck pain or headache no fevers chills or vomiting.  No weakness or numbness in arms or legs.  Pain stays in left trapezius area.  No radiation down arms.  The history is provided by the patient.  Shoulder Injury Pertinent negatives include no chest pain, no abdominal pain, no headaches and no shortness of breath.       Prior to Admission medications   Medication Sig Start Date End Date Taking? Authorizing Provider  acetaminophen  (TYLENOL ) 325 MG tablet Take 2 tablets (650 mg total) by mouth every 6 (six) hours as needed. 02/01/24   Elnor Jayson LABOR, DO  Alcohol  Swabs (ALCOHOL  PREP) PADS Use to clean skin and vials for testosterone  injections. 11/12/17   Joane Artist RAMAN, MD  AMBULATORY NON FORMULARY MEDICATION Please set CPAP to auto-titration from 10-20.  \  Adjust mask fit.  Send 30 day compliance report 30 days after change is made.   Send to [facility] 07/30/19   Corey, Evan S, MD  anastrozole  (ARIMIDEX ) 1 MG tablet Take 1 tablet (1 mg total) by mouth once a week. 04/06/24   Stoneking, Adine PARAS., MD  atorvastatin  (LIPITOR) 80 MG tablet Take 1 tablet (80 mg total) by mouth daily. 12/05/23   Alvia Bring, DO  baclofen  (LIORESAL ) 10 MG tablet Take 1 tablet (10 mg total) by mouth 3 (three) times daily. 08/25/24   Webb, Padonda B, FNP  Cholecalciferol  1.25 MG (50000 UT) TABS Take 1 tablet by mouth once a week. 03/02/24   Cyndi Shaver, PA-C  diclofenac  Sodium (VOLTAREN ) 1 % GEL Apply a small amount as needed up to twice daily to area  of concern 07/23/24   Drubel, Shaver, PA-C  escitalopram  (LEXAPRO ) 10 MG tablet TAKE 1 TABLET BY MOUTH EVERY DAY. Needs appointment. 12/05/23   Alvia Bring, DO  hydrOXYzine  (VISTARIL ) 25 MG capsule Take 1 capsule (25 mg total) by mouth every 8 (eight) hours as needed for anxiety. 08/25/24   Webb, Padonda B, FNP  losartan  (COZAAR ) 50 MG tablet Take 1 tablet (50 mg total) by mouth daily. 12/05/23   Alvia Bring, DO  Magnesium  Chloride (MAG64) 64 MG TBEC Take 1 tablet (64 mg total) by mouth 3 (three) times daily. 07/23/24   Cyndi Shaver, PA-C  meloxicam  (MOBIC ) 15 MG tablet Take 1 tablet (15 mg total) by mouth daily. 08/25/24   Webb, Padonda B, FNP  metFORMIN  (GLUCOPHAGE -XR) 500 MG 24 hr tablet Take 1 tablet (500 mg total) by mouth daily with breakfast. 12/05/23   Alvia Bring, DO  tirzepatide  (MOUNJARO ) 15 MG/0.5ML Pen INJECT 15 MG INTO THE SKIN ONCE A WEEK. 08/02/24   Cyndi Shaver, PA-C    Allergies: Amlodipine     Review of Systems  Constitutional:  Negative for chills and fever.  HENT:  Negative for congestion.   Eyes:  Negative for visual disturbance.  Respiratory:  Negative for shortness of breath.   Cardiovascular:  Negative for chest pain.  Gastrointestinal:  Negative for abdominal pain and vomiting.  Genitourinary:  Negative for dysuria and flank pain.  Musculoskeletal:  Negative for back pain, joint swelling, neck pain and neck stiffness.  Skin:  Negative for rash.  Neurological:  Negative for light-headedness and headaches.    Updated Vital Signs BP (!) 136/98 (BP Location: Right Arm)   Pulse (!) 113   Temp 98.2 F (36.8 C) (Oral)   Resp 16   Ht 5' 11 (1.803 m)   Wt 115.7 kg   SpO2 100%   BMI 35.57 kg/m   Physical Exam Vitals and nursing note reviewed.  Constitutional:      General: He is not in acute distress.    Appearance: He is well-developed.  HENT:     Head: Normocephalic and atraumatic.     Mouth/Throat:     Mouth: Mucous membranes are moist.  Eyes:      General:        Right eye: No discharge.        Left eye: No discharge.     Conjunctiva/sclera: Conjunctivae normal.  Neck:     Trachea: No tracheal deviation.  Cardiovascular:     Rate and Rhythm: Regular rhythm. Tachycardia present.  Pulmonary:     Effort: Pulmonary effort is normal.     Breath sounds: Normal breath sounds.  Abdominal:     General: There is no distension.     Palpations: Abdomen is soft.     Tenderness: There is no abdominal tenderness. There is no guarding.  Musculoskeletal:        General: Tenderness present. No swelling.     Cervical back: Normal range of motion and neck supple. No rigidity.     Comments: Patient has mild tenderness to palpation and contraction of left trapezius tight musculature.  Patient has normal strength with flexion extension of major joints in upper extremities sensation intact palpation of major nerves in upper extremities.  No midline cervical tenderness.  Patient has full range of motion of left shoulder without significant discomfort.  Equal pulses 2+ distal arms.  Skin:    General: Skin is warm.     Capillary Refill: Capillary refill takes less than 2 seconds.     Findings: No rash.  Neurological:     General: No focal deficit present.     Mental Status: He is alert.     Cranial Nerves: No cranial nerve deficit.  Psychiatric:        Mood and Affect: Mood normal.     (all labs ordered are listed, but only abnormal results are displayed) Labs Reviewed - No data to display  EKG: None  Radiology: DG Shoulder Left Result Date: 08/30/2024 CLINICAL DATA:  pain. EXAM: LEFT SHOULDER - 2+ VIEW COMPARISON:  None Available. FINDINGS: No acute fracture or dislocation. No aggressive osseous lesion. Glenohumeral and acromioclavicular joints are normal in alignment. No significant arthritis. No soft tissue swelling. No radiopaque foreign bodies. IMPRESSION: *No acute osseous abnormality of the left shoulder. Electronically Signed   By:  Ree Molt M.D.   On: 08/30/2024 08:25     Procedures   Medications Ordered in the ED  acetaminophen  (TYLENOL ) tablet 1,000 mg (has no administration in time range)                                    Medical Decision Making Amount and/or Complexity of Data Reviewed Radiology: ordered.  Risk OTC drugs.   Patient presents with clinical concern  for trapezius strain unfortunately been going on for almost 3 months.  Patient says started after an injury at work.  Discussed follow-up with Worker's Comp., primary doctor and sports medicine/orthopedic details provided.  Tylenol  or for pain.  Discomfort has become chronic.  Patient has no evidence of other significant joint involvement no fevers.  Mild tachycardia likely secondary to discomfort.  Patient has no shortness of breath or chest pain to warrant further evaluation for cardiac etiology at this time.  No indication for emergent MRI.  Patient comfortable plan work note given.     Final diagnoses:  Left shoulder strain, initial encounter  Trapezius strain, left, initial encounter    ED Discharge Orders     None          Tonia Chew, MD 08/30/24 [id number]

## 2024-08-30 NOTE — ED Triage Notes (Signed)
 States injured left shoulder  3 months ago on the job and  still not better is lifiting heavy ojects  sharp pain pain comes and goes  hurts  even when he sleeps

## 2024-08-31 MED ORDER — ATORVASTATIN CALCIUM 80 MG PO TABS: 80.0000 mg | ORAL_TABLET | Freq: Every day | ORAL | 0 refills | Status: AC

## 2024-09-09 ENCOUNTER — Ambulatory Visit: Admitting: Urology

## 2024-09-09 ENCOUNTER — Encounter: Payer: Self-pay | Admitting: Urology

## 2024-09-09 VITALS — BP 131/81 | HR 109 | Ht 71.0 in | Wt 255.0 lb

## 2024-09-09 DIAGNOSIS — E291 Testicular hypofunction: Secondary | ICD-10-CM | POA: Diagnosis not present

## 2024-09-09 MED ORDER — TESTOSTERONE UNDECANOATE 750 MG/3ML IM SOLN: Freq: Once | INTRAMUSCULAR | Status: AC

## 2024-09-09 NOTE — Progress Notes (Signed)
 Assessment: 1. Hypogonadism in male     Plan: Aveed  #3 given today. Continue anastrozole  1 mg weekly. Return to office in 10 weeks for next Aveed  injection. Labs in 5 weeks: testosterone , CBC, estradiol   Chief Complaint:  Chief Complaint  Patient presents with   Hypogonadism    History of Present Illness:  Dylan Munoz. is a 48 y.o. male who is seen for continued evaluation of hypogonadism. He has a history of hypogonadism.  He has been managed with IM testosterone  injections for a number of years.  Review of his chart would indicate this was started in 2017.  He was using a dose of 100 mg weekly.  Testosterone  level: 8/20 459 3/21 628 7/23 895 5/24 464 3/25 95  H&H 3/25 12/35.9 PSA 7/23 0.7  He has not received any testosterone  injections for approximately 6 months.  He reports symptoms of decreased energy, decreased libido, and erectile dysfunction as well as loss of muscle mass. He is not having any lower urinary tract symptoms.  He is not interested in maintaining his fertility.  ADAM =  8/10 (no to 4,10)  Labs from 4/25: PSA  1.0 Prolactin 12.9 LH  0.6 Estradiol  86.4  He was started on anastrozole  1 mg weekly in April 2025. He received his first dose of Aveed  on 06/04/24. He presents for continued testosterone  replacement therapy with Aveed . No problems following his last injection.  He noted some improvement in his symptoms already. He presents today for injection #3. He continues to note symptomatic improvement.  No side effects.  Portions of the above documentation were copied from a prior visit for review purposes only.   Past Medical History:  Past Medical History:  Diagnosis Date   Anal fissure    Anemia    B12 deficiency    Diabetes (HCC)    type 2   H. pylori infection    High cholesterol    Hypertension    Hypomagnesemia    Morbid obesity (HCC) 04/17/2016   RSV (respiratory syncytial virus infection)    Severe obstructive sleep  apnea 04/17/2016   AHI 14.9 with desats <88% for >90 mins on SNAP report 04/02/18. See scanned document.   Testosterone  deficiency 04/19/2016   Vitamin D  deficiency 04/19/2016    Past Surgical History:  Past Surgical History:  Procedure Laterality Date   KNEE ARTHROSCOPY W/ ACL RECONSTRUCTION      Allergies:  Allergies  Allergen Reactions   Amlodipine  Other (See Comments)    Constipation    Family History:  Family History  Problem Relation Age of Onset   Heart disease Father    Diabetes Paternal Uncle    Colon cancer Neg Hx    Esophageal cancer Neg Hx     Social History:  Social History   Tobacco Use   Smoking status: Never   Smokeless tobacco: Current    Types: Snuff  Vaping Use   Vaping status: Never Used  Substance Use Topics   Alcohol  use: Not Currently    Comment: weekly   Drug use: No    ROS: Constitutional:  Negative for fever, chills, weight loss CV: Negative for chest pain, previous MI, hypertension Respiratory:  Negative for shortness of breath, wheezing, sleep apnea, frequent cough GI:  Negative for nausea, vomiting, bloody stool, GERD  Physical exam: BP 131/81   Pulse (!) 109   Ht 5' 11 (1.803 m)   Wt 255 lb (115.7 kg)   BMI 35.57 kg/m  GENERAL APPEARANCE:  Well appearing, well developed, well nourished, NAD   Results: None

## 2024-09-09 NOTE — Progress Notes (Signed)
 Aveed  Injection  Due to Hypogonadism patient is present today for a Aveed  Injection.  Medication: Aveed  Dose: 750mg / 3ml NDC: Onu:[id number] Exp:12/2027 Location: left upper outer buttocks Injection of medication was given over a 60 second period of time.  Patient tolerated well, no complications were noted Patient remained in the office for 30 minutes post injection for monitoring and will report any side effects.  Performed by: C. Koleen, LPN   Additional notes/ Follow up: 5 weeks for labs and 10 weeks for injection

## 2024-09-15 ENCOUNTER — Encounter: Payer: Self-pay | Admitting: Student

## 2024-09-15 DIAGNOSIS — E669 Obesity, unspecified: Secondary | ICD-10-CM | POA: Insufficient documentation

## 2024-09-15 DIAGNOSIS — E119 Type 2 diabetes mellitus without complications: Secondary | ICD-10-CM | POA: Insufficient documentation

## 2024-09-15 NOTE — Progress Notes (Unsigned)
 Subjective:     Patient ID: Dylan Munoz., male    DOB: 1976-02-20, 48 y.o.   MRN: [id number]  No chief complaint on file.   HPI  Discussed the use of AI scribe software for clinical note transcription with the patient, who gave verbal consent to proceed.   History of Present Illness         Darey Hershberger. is a 48 year old male presents for [facility] and follow-up.  DM type II Mounjaro  15 mg weekly, metformin  500 mg daily   Health Maintenance Due  Topic Date Due   FOOT EXAM  Never done   Pneumococcal Vaccine (1 of 2 - PCV) Never done   Hepatitis B Vaccines 19-59 Average Risk (1 of 3 - 19+ 3-dose series) Never done   OPHTHALMOLOGY EXAM  06/30/2024   Influenza Vaccine  07/16/2024   COVID-19 Vaccine (4 - 2025-26 season) 08/16/2024   HEMOGLOBIN A1C  08/22/2024    Past Medical History:  Diagnosis Date   Anal fissure    Anemia    B12 deficiency    Diabetes (HCC)    type 2   H. pylori infection    High cholesterol    Hypertension    Hypomagnesemia    Morbid obesity (HCC) 04/17/2016   RSV (respiratory syncytial virus infection)    Severe obstructive sleep apnea 04/17/2016   AHI 14.9 with desats <88% for >90 mins on SNAP report 04/02/18. See scanned document.   Testosterone  deficiency 04/19/2016   Vitamin D  deficiency 04/19/2016    Past Surgical History:  Procedure Laterality Date   KNEE ARTHROSCOPY W/ ACL RECONSTRUCTION      Family History  Problem Relation Age of Onset   Heart disease Father    Diabetes Paternal Uncle    Colon cancer Neg Hx    Esophageal cancer Neg Hx     Social History   Socioeconomic History   Marital status: Single    Spouse name: Not on file   Number of children: Not on file   Years of education: Not on file   Highest education level: Not on file  Occupational History   Not on file  Tobacco Use   Smoking status: Never   Smokeless tobacco: Current    Types: Snuff  Vaping Use   Vaping status: Never Used  Substance and  Sexual Activity   Alcohol  use: Not Currently    Comment: weekly   Drug use: No   Sexual activity: Yes  Other Topics Concern   Not on file  Social History Narrative   Not on file   Social Drivers of Health   Financial Resource Strain: Not on file  Food Insecurity: Not on file  Transportation Needs: Not on file  Physical Activity: Not on file  Stress: Not on file  Social Connections: Not on file  Intimate Partner Violence: Not on file    Outpatient Medications Prior to Visit  Medication Sig Dispense Refill   acetaminophen  (TYLENOL ) 325 MG tablet Take 2 tablets (650 mg total) by mouth every 6 (six) hours as needed. 36 tablet 0   Alcohol  Swabs (ALCOHOL  PREP) PADS Use to clean skin and vials for testosterone  injections. 100 each prn   AMBULATORY NON FORMULARY MEDICATION Please set CPAP to auto-titration from 10-20.  \  Adjust mask fit.  Send 30 day compliance report 30 days after change is made.   Send to [facility] 1 each 0   anastrozole  (ARIMIDEX ) 1 MG tablet Take 1 tablet (1  mg total) by mouth once a week. 4 tablet 11   atorvastatin  (LIPITOR) 80 MG tablet Take 1 tablet (80 mg total) by mouth daily. 90 tablet 0   baclofen  (LIORESAL ) 10 MG tablet Take 1 tablet (10 mg total) by mouth 3 (three) times daily. 30 each 1   Cholecalciferol  1.25 MG (50000 UT) TABS Take 1 tablet by mouth once a week. 12 tablet 0   diclofenac  Sodium (VOLTAREN ) 1 % GEL Apply a small amount as needed up to twice daily to area of concern 150 g 0   escitalopram  (LEXAPRO ) 10 MG tablet TAKE 1 TABLET BY MOUTH EVERY DAY. Needs appointment. 90 tablet 1   hydrOXYzine  (VISTARIL ) 25 MG capsule Take 1 capsule (25 mg total) by mouth every 8 (eight) hours as needed for anxiety. 30 capsule 3   losartan  (COZAAR ) 50 MG tablet Take 1 tablet (50 mg total) by mouth daily. 90 tablet 2   Magnesium  Chloride (MAG64) 64 MG TBEC Take 1 tablet (64 mg total) by mouth 3 (three) times daily. 90 tablet 0   meloxicam  (MOBIC ) 15 MG tablet Take 1  tablet (15 mg total) by mouth daily. 30 tablet 3   metFORMIN  (GLUCOPHAGE -XR) 500 MG 24 hr tablet Take 1 tablet (500 mg total) by mouth daily with breakfast. 90 tablet 2   tirzepatide  (MOUNJARO ) 15 MG/0.5ML Pen INJECT 15 MG INTO THE SKIN ONCE A WEEK. 2 mL 1   No facility-administered medications prior to visit.    Allergies  Allergen Reactions   Amlodipine  Other (See Comments)    Constipation    ROS     Objective:    Physical Exam   There were no vitals taken for this visit. Wt Readings from Last 3 Encounters:  09/09/24 255 lb (115.7 kg)  08/30/24 255 lb (115.7 kg)  07/23/24 253 lb 12.8 oz (115.1 kg)       Assessment & Plan:   Problem List Items Addressed This Visit   None   I am having Dylan Munoz. maintain his Alcohol  Prep, AMBULATORY NON FORMULARY MEDICATION, escitalopram , losartan , metFORMIN , acetaminophen , Cholecalciferol , anastrozole , diclofenac  Sodium, Mag64, Mounjaro , baclofen , meloxicam , hydrOXYzine , and atorvastatin .  No orders of the defined types were placed in this encounter.

## 2024-09-17 ENCOUNTER — Encounter: Payer: Self-pay | Admitting: Student

## 2024-09-17 ENCOUNTER — Ambulatory Visit: Admitting: Sports Medicine

## 2024-09-17 ENCOUNTER — Encounter: Payer: Self-pay | Admitting: *Deleted

## 2024-09-17 ENCOUNTER — Ambulatory Visit (INDEPENDENT_AMBULATORY_CARE_PROVIDER_SITE_OTHER): Admitting: Student

## 2024-09-17 DIAGNOSIS — Z91199 Patient's noncompliance with other medical treatment and regimen due to unspecified reason: Secondary | ICD-10-CM

## 2024-09-17 DIAGNOSIS — I10 Essential (primary) hypertension: Secondary | ICD-10-CM

## 2024-09-17 DIAGNOSIS — E782 Mixed hyperlipidemia: Secondary | ICD-10-CM

## 2024-09-17 DIAGNOSIS — E669 Obesity, unspecified: Secondary | ICD-10-CM

## 2024-09-17 DIAGNOSIS — E1165 Type 2 diabetes mellitus with hyperglycemia: Secondary | ICD-10-CM

## 2024-09-17 NOTE — Progress Notes (Signed)
 Error.  Pt no show

## 2024-09-24 ENCOUNTER — Encounter: Payer: Self-pay | Admitting: Sports Medicine

## 2024-09-24 ENCOUNTER — Ambulatory Visit (INDEPENDENT_AMBULATORY_CARE_PROVIDER_SITE_OTHER): Admitting: Sports Medicine

## 2024-09-24 VITALS — BP 140/90 | Ht 71.0 in | Wt 255.0 lb

## 2024-09-24 DIAGNOSIS — M5010 Cervical disc disorder with radiculopathy, unspecified cervical region: Secondary | ICD-10-CM

## 2024-09-24 DIAGNOSIS — M542 Cervicalgia: Secondary | ICD-10-CM | POA: Diagnosis not present

## 2024-09-24 MED ORDER — DICLOFENAC SODIUM 75 MG PO TBEC: 75.0000 mg | DELAYED_RELEASE_TABLET | Freq: Two times a day (BID) | ORAL | 0 refills | Status: DC

## 2024-09-24 NOTE — Progress Notes (Signed)
   Subjective:    Patient ID: Dylan Gail Raddle., male    DOB: 02-Feb-1976, 48 y.o.   MRN: [id number]  HPI chief complaint: Left shoulder pain  Dylan Munoz is a very pleasant right-hand-dominant 48 year old male that presents today with left shoulder pain that has been present since April.  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  He describes a stabbing type of pressure that begins in the left side of his neck and will radiate into the left shoulder.  It is constantly present.  It is not affected by any sort of shoulder motion.  He was seen at a local urgent care in September where x-rays of the left shoulder were unremarkable.  He was prescribed meloxicam  but it has not been helpful.  He denies problems with his shoulder prior in April.  He does endorse some numbness and tingling.  Also pain at night.  Past medical history reviewed Medications reviewed Allergies reviewed  Review of Systems As above    Objective:   Physical Exam  Well-developed, well-nourished.  No acute distress  Cervical spine: Full cervical range of motion.  Positive Spurling's to the left.  He is tender to palpation along the left paraspinal musculature as well as into the left parascapular area.  No midline tenderness.  Left shoulder: Full painless range of motion.  Negative empty can.  Rotator cuff strength is 5/5 and does not reproduce pain.  Neurological exam: Strength is 5/5 in both upper extremities.  Reflexes are trace but equal at the biceps, triceps, and brachial radialis tendons.  No atrophy.      Assessment & Plan:   Left shoulder pain secondary to cervical radiculopathy  Voltaren  75 mg twice daily with food as needed for pain.  He will start physical therapy and will follow-up in 4 weeks.  If symptoms persist at follow-up consider imaging at that time.  This note was dictated using Dragon naturally speaking software and may contain errors in syntax, spelling, or  content which have not been identified prior to signing this note.

## 2024-09-27 ENCOUNTER — Other Ambulatory Visit: Payer: Self-pay | Admitting: *Deleted

## 2024-09-27 DIAGNOSIS — S161XXA Strain of muscle, fascia and tendon at neck level, initial encounter: Secondary | ICD-10-CM

## 2024-09-28 MED ORDER — DICLOFENAC SODIUM 1 % EX GEL
CUTANEOUS | 0 refills | Status: AC
Start: 2024-09-28 — End: ?

## 2024-10-01 DIAGNOSIS — M542 Cervicalgia: Secondary | ICD-10-CM | POA: Diagnosis not present

## 2024-10-15 ENCOUNTER — Other Ambulatory Visit

## 2024-10-15 ENCOUNTER — Encounter: Payer: Self-pay | Admitting: Student

## 2024-10-15 DIAGNOSIS — E291 Testicular hypofunction: Secondary | ICD-10-CM

## 2024-10-15 DIAGNOSIS — E1165 Type 2 diabetes mellitus with hyperglycemia: Secondary | ICD-10-CM

## 2024-10-16 ENCOUNTER — Other Ambulatory Visit: Payer: Self-pay | Admitting: Family

## 2024-10-16 DIAGNOSIS — E1165 Type 2 diabetes mellitus with hyperglycemia: Secondary | ICD-10-CM

## 2024-10-16 LAB — CBC
Hematocrit: 37.5 % (ref 37.5–51.0)
Hemoglobin: 12.4 g/dL — ABNORMAL LOW (ref 13.0–17.7)
MCH: 32.2 pg (ref 26.6–33.0)
MCHC: 33.1 g/dL (ref 31.5–35.7)
MCV: 97 fL (ref 79–97)
Platelets: 258 x10E3/uL (ref 150–450)
RBC: 3.85 x10E6/uL — ABNORMAL LOW (ref 4.14–5.80)
RDW: 14.2 % (ref 11.6–15.4)
WBC: 4.7 x10E3/uL (ref 3.4–10.8)

## 2024-10-16 LAB — TESTOSTERONE: Testosterone: 328 ng/dL (ref 264–916)

## 2024-10-16 LAB — ESTRADIOL: Estradiol: <5 pg/mL — ABNORMAL LOW (ref 7.6–42.6)

## 2024-10-16 MED ORDER — MOUNJARO 15 MG/0.5ML ~~LOC~~ SOAJ: 15.0000 mg | SUBCUTANEOUS | 1 refills | Status: DC

## 2024-10-19 ENCOUNTER — Ambulatory Visit: Payer: Self-pay | Admitting: Urology

## 2024-10-22 ENCOUNTER — Ambulatory Visit (INDEPENDENT_AMBULATORY_CARE_PROVIDER_SITE_OTHER)

## 2024-10-22 VITALS — BP 128/90 | Ht 71.0 in | Wt 255.0 lb

## 2024-10-22 DIAGNOSIS — R202 Paresthesia of skin: Secondary | ICD-10-CM | POA: Diagnosis not present

## 2024-10-22 DIAGNOSIS — R200 Anesthesia of skin: Secondary | ICD-10-CM

## 2024-10-22 DIAGNOSIS — M5010 Cervical disc disorder with radiculopathy, unspecified cervical region: Secondary | ICD-10-CM

## 2024-10-22 NOTE — Progress Notes (Signed)
   Subjective:    Patient ID: Dylan Gail Raddle., male    DOB: 48 y.o., 1976-11-12   MRN: [id number]  Chief Complaint: Cervical radiculopathy follow up (4 weeks)  Discussed the use of AI scribe software for clinical note transcription with the patient, who gave verbal consent to proceed.  History of Present Illness Dylan Munoz. is a 48 year old male who presents with bilateral arm numbness and tingling.  Bilateral upper extremity paresthesia - Numbness and tingling present in both arms, extending from the neck to the hands - Entire hand affected, including the pinky, thumb, and other fingers - Symptoms are numb rather than painful - Paresthesia occurs throughout the day, with increased severity in the mornings - No worsening of symptoms with physical activity or specific arm movements - No nocturnal symptoms; does not wake up at night needing to shake out his hands  Temporal relationship to medication use - Onset of symptoms occurred after starting oral Voltaren  - Voltaren  has not alleviated the tingling and may have contributed to its onset - Continues to take Voltaren  despite persistent symptoms  History of cervical discomfort - Similar flare of neck pain in 2019, but without extension of symptoms to the hands - No recollection of specific injury or event triggering the previous episode  Physical therapy and occupational factors - Attended three physical therapy sessions - 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  Cervical Spine -Inspection: no swelling or skin changes -Palpation: TTP - paraspinals, - midline -AROM/PROM: FROM in all planes of the neck -Strength: full neck flexion/extension (C1/C2), neck sidebending (C3), shoulder shrug (C4), shoulder abduction (C5), elbow flexion (C6), elbow extension (C7), thumb extension  (C8/radial), finger abduction (T1/ulnar), OK sign (median) -Sensation: intact sensation of the thumb (C6), 2nd/3rd digits (C7), 4th/5th digits (C8). -Reflexes: normal biceps (C5/6), brachioradialis (C5/6), triceps (C7/8) reflexes, equal bilaterally -Special tests: Equivocal tornado test     Assessment & Plan:   Assessment & Plan Bilateral upper extremity numbness and tingling   He experiences persistent numbness and tingling in both hands, extending from the neck, without associated pain. Symptoms worsen in the morning and are not affected by arm use. Thoracic outlet syndrome due to possible vascular compression is considered. Symptoms began after starting oral Voltaren , indicating a potential medication link. Discontinue oral Voltaren  and continue using Voltaren  gel. An ultrasound is ordered to assess for vascular compression in the thoracic outlet. Monitor for worsening pain after stopping oral Voltaren  and adjust pain management as needed.  Cervical disc disorder with radiculopathy   A previous flare occurred in 2019. Current symptoms do not match typical radiculopathy as there is no pain radiating from the neck to the hands, suggesting a vascular issue rather than nerve root compression. An ultrasound is ordered to assess for arterial vascular compression in the thoracic outlet. Continue physical therapy sessions.

## 2024-11-07 ENCOUNTER — Other Ambulatory Visit: Payer: Self-pay | Admitting: Family Medicine

## 2024-11-19 ENCOUNTER — Encounter: Payer: Self-pay | Admitting: Urology

## 2024-11-19 ENCOUNTER — Ambulatory Visit: Admitting: Urology

## 2024-11-19 VITALS — BP 147/92 | HR 116

## 2024-11-19 DIAGNOSIS — E291 Testicular hypofunction: Secondary | ICD-10-CM | POA: Diagnosis not present

## 2024-11-19 MED ORDER — TESTOSTERONE UNDECANOATE 750 MG/3ML IM SOLN
750.0000 mg | Freq: Once | INTRAMUSCULAR | Status: AC
  Administered 2024-11-19: 750 mg via INTRAMUSCULAR

## 2024-11-19 NOTE — Progress Notes (Signed)
 Aveed  Injection  Due to Hypogonadism patient is present today for a Aveed  Injection.  Medication: Aveed  Dose: 750mg / 3ml [phone] Onu:[id number] Exp:01/16/2028 Location: right upper outer buttocks Injection of medication was given over a 60 second period of time.  Patient tolerated well, no complications were noted Patient remained in the office for 30 minutes post injection for monitoring and will report any side effects.  Performed by: Donyea Beverlin CMA

## 2024-11-19 NOTE — Progress Notes (Signed)
 Assessment: 1. Hypogonadism in male     Plan: Aveed  #4 given today. Return to office in 10 weeks for next Aveed  injection. Labs in 5 weeks:  testosterone , estradiol , PSA  Chief Complaint:  Chief Complaint  Patient presents with   Hypogonadism    History of Present Illness:  Dylan Munoz. is a 48 y.o. male who is seen for continued evaluation of hypogonadism. He has a history of hypogonadism.  He has been managed with IM testosterone  injections for a number of years.  Review of his chart would indicate this was started in 2017.  He was using a dose of 100 mg weekly.  Testosterone  level: 8/20 459 3/21 628 7/23 895 5/24 464 3/25 95  H&H 3/25 12/35.9 PSA 7/23 0.7  He has not received any testosterone  injections for approximately 6 months.  He reports symptoms of decreased energy, decreased libido, and erectile dysfunction as well as loss of muscle mass. He is not having any lower urinary tract symptoms.  He is not interested in maintaining his fertility.  ADAM =  8/10 (no to 4,10)  Labs from 4/25: PSA  1.0 Prolactin 12.9 LH  0.6 Estradiol  86.4  He was started on anastrozole  1 mg weekly in April 2025. He received his first dose of Aveed  on 06/04/24. He presents for continued testosterone  replacement therapy with Aveed . No problems following his last injection.  He noted some improvement in his symptoms already. He received his third injection on 09/09/2024.    Labs from 10/15/2024: Testosterone  328 Estradiol  <5.0 H&H  12.4/37.5  The Arimidex  was discontinued. He presents today for Aveed  injection #4. He continues to have excellent symptomatic improvement.  No side effects from treatment.  Portions of the above documentation were copied from a prior visit for review purposes only.   Past Medical History:  Past Medical History:  Diagnosis Date   Anal fissure    Anemia    B12 deficiency    Diabetes (HCC)    type 2   H. pylori infection    High  cholesterol    Hypertension    Hypomagnesemia    Morbid obesity (HCC) 04/17/2016   RSV (respiratory syncytial virus infection)    Severe obstructive sleep apnea 04/17/2016   AHI 14.9 with desats <88% for >90 mins on SNAP report 04/02/18. See scanned document.   Testosterone  deficiency 04/19/2016   Vitamin D  deficiency 04/19/2016    Past Surgical History:  Past Surgical History:  Procedure Laterality Date   KNEE ARTHROSCOPY W/ ACL RECONSTRUCTION      Allergies:  Allergies  Allergen Reactions   Amlodipine  Other (See Comments)    Constipation    Family History:  Family History  Problem Relation Age of Onset   Heart disease Father    Diabetes Paternal Uncle    Colon cancer Neg Hx    Esophageal cancer Neg Hx     Social History:  Social History   Tobacco Use   Smoking status: Never   Smokeless tobacco: Current    Types: Snuff  Vaping Use   Vaping status: Never Used  Substance Use Topics   Alcohol  use: Not Currently    Comment: weekly   Drug use: No    ROS: Constitutional:  Negative for fever, chills, weight loss CV: Negative for chest pain, previous MI, hypertension Respiratory:  Negative for shortness of breath, wheezing, sleep apnea, frequent cough GI:  Negative for nausea, vomiting, bloody stool, GERD  Physical exam: BP (!) 147/92  Pulse (!) 116  GENERAL APPEARANCE:  Well appearing, well developed, well nourished, NAD   Results: None

## 2024-11-26 ENCOUNTER — Other Ambulatory Visit: Payer: Self-pay | Admitting: Family Medicine

## 2024-11-26 DIAGNOSIS — R7303 Prediabetes: Secondary | ICD-10-CM

## 2024-11-29 ENCOUNTER — Encounter (HOSPITAL_BASED_OUTPATIENT_CLINIC_OR_DEPARTMENT_OTHER): Payer: Self-pay | Admitting: Emergency Medicine

## 2024-11-29 ENCOUNTER — Emergency Department (HOSPITAL_BASED_OUTPATIENT_CLINIC_OR_DEPARTMENT_OTHER)
Admission: EM | Admit: 2024-11-29 | Discharge: 2024-11-29 | Disposition: A | Discharge status: Home / Self Care | Attending: Emergency Medicine | Admitting: Emergency Medicine

## 2024-11-29 ENCOUNTER — Other Ambulatory Visit: Payer: Self-pay

## 2024-11-29 DIAGNOSIS — R200 Anesthesia of skin: Secondary | ICD-10-CM

## 2024-11-29 DIAGNOSIS — X501XXA Overexertion from prolonged static or awkward postures, initial encounter: Secondary | ICD-10-CM | POA: Diagnosis not present

## 2024-11-29 DIAGNOSIS — S46812A Strain of other muscles, fascia and tendons at shoulder and upper arm level, left arm, initial encounter: Secondary | ICD-10-CM | POA: Diagnosis not present

## 2024-11-29 DIAGNOSIS — S4992XA Unspecified injury of left shoulder and upper arm, initial encounter: Secondary | ICD-10-CM | POA: Diagnosis not present

## 2024-11-29 MED ORDER — KETOROLAC TROMETHAMINE 15 MG/ML IJ SOLN
15.0000 mg | Freq: Once | INTRAMUSCULAR | Status: AC
  Administered 2024-11-29: 08:00:00 15 mg via INTRAMUSCULAR
  Filled 2024-11-29: qty 1

## 2024-11-29 MED ORDER — DICLOFENAC SODIUM 1 % EX GEL: 4.0000 g | Freq: Four times a day (QID) | CUTANEOUS | 0 refills | Status: AC

## 2024-11-29 MED ORDER — METHYLPREDNISOLONE 4 MG PO TBPK: ORAL_TABLET | ORAL | 0 refills | Status: DC

## 2024-11-29 MED ORDER — ACETAMINOPHEN 500 MG PO TABS
1000.0000 mg | ORAL_TABLET | Freq: Once | ORAL | Status: AC
  Administered 2024-11-29: 08:00:00 1000 mg via ORAL
  Filled 2024-11-29: qty 2

## 2024-11-29 NOTE — ED Triage Notes (Signed)
 Left arm pain and numbness , was seen for this and have plan to do MRI , just not scheduled yet . Reports numbness and tingling from neck to right  hand and fingers

## 2024-11-29 NOTE — Discharge Instructions (Addendum)
 Take the steroids as prescribed.  Use the gel as prescribed. Also take tylenol  1000mg (2 extra strength) four times a day.   Please call your doctor this morning and let them know about your symptoms.  See when they can see you in clinic.

## 2024-11-29 NOTE — Addendum Note (Signed)
 Addended by: CHARLES ROGUE A on: 11/29/2024 08:35 AM   Modules accepted: Orders

## 2024-11-29 NOTE — ED Provider Notes (Signed)
 [facility] Provider Note   CSN: [id number] Arrival date & time: 11/29/24  9280     Patient presents with: Arm Pain   Dylan Huy. is a 48 y.o. male.   48 yo M with a chief complaints of left arm pain.  This has been a problem for him off and on for some time.  He tells me maybe it is worse over the past week.  He has seen by a sports medicine physician.  Has tried physical therapy NSAIDs.  Denies recent injury.  Feels like the arm is tingly.  Pain is worse with movement twisting palpation.   Arm Pain       Prior to Admission medications  Medication Sig Start Date End Date Taking? Authorizing Provider  diclofenac  Sodium (VOLTAREN ) 1 % GEL Apply 4 g topically 4 (four) times daily. 11/29/24  Yes Emil Share, DO  methylPREDNISolone  (MEDROL  DOSEPAK) 4 MG TBPK tablet Day 1: 8mg  before breakfast, 4 mg after lunch, 4 mg after supper, and 8 mg at bedtime Day 2: 4 mg before breakfast, 4 mg after lunch, 4 mg  after supper, and 8 mg  at bedtime Day 3:  4 mg  before breakfast, 4 mg  after lunch, 4 mg after supper, and 4 mg  at bedtime Day 4: 4 mg  before breakfast, 4 mg  after lunch, and 4 mg at bedtime Day 5: 4 mg  before breakfast and 4 mg at bedtime Day 6: 4 mg  before breakfast 11/29/24  Yes Jiovanna Frei, DO  acetaminophen  (TYLENOL ) 325 MG tablet Take 2 tablets (650 mg total) by mouth every 6 (six) hours as needed. 02/01/24   Elnor Jayson LABOR, DO  Alcohol  Swabs (ALCOHOL  PREP) PADS Use to clean skin and vials for testosterone  injections. 11/12/17   Joane Artist RAMAN, MD  AMBULATORY NON FORMULARY MEDICATION Please set CPAP to auto-titration from 10-20.  \  Adjust mask fit.  Send 30 day compliance report 30 days after change is made.   Send to [facility] 07/30/19   Joane Artist RAMAN, MD  atorvastatin  (LIPITOR) 80 MG tablet Take 1 tablet (80 mg total) by mouth daily. 08/31/24   Antonio Cyndee Jamee JONELLE, DO  baclofen  (LIORESAL ) 10 MG tablet Take 1 tablet (10 mg  total) by mouth 3 (three) times daily. 08/25/24   Webb, Padonda B, FNP  Cholecalciferol  1.25 MG (50000 UT) TABS Take 1 tablet by mouth once a week. 03/02/24   Cyndi Shaver, PA-C  diclofenac  (VOLTAREN ) 75 MG EC tablet Take 1 tablet (75 mg total) by mouth 2 (two) times daily. Take with food. 09/24/24   Draper, Evalene R, DO  escitalopram  (LEXAPRO ) 10 MG tablet TAKE 1 TABLET BY MOUTH EVERY DAY. Needs appointment. 12/05/23   Alvia Bring, DO  hydrOXYzine  (VISTARIL ) 25 MG capsule Take 1 capsule (25 mg total) by mouth every 8 (eight) hours as needed for anxiety. 08/30/24   Webb, Padonda B, FNP  losartan  (COZAAR ) 50 MG tablet Take 1 tablet (50 mg total) by mouth daily. 12/05/23   Alvia Bring, DO  Magnesium  Chloride (MAG64) 64 MG TBEC Take 1 tablet (64 mg total) by mouth 3 (three) times daily. 07/23/24   Cyndi Shaver, PA-C  meloxicam  (MOBIC ) 15 MG tablet Take 1 tablet (15 mg total) by mouth daily. 08/30/24   Webb, Padonda B, FNP  metFORMIN  (GLUCOPHAGE -XR) 500 MG 24 hr tablet Take 1 tablet (500 mg total) by mouth daily with breakfast. 12/05/23  Alvia Bring, DO  tirzepatide  (MOUNJARO ) 15 MG/0.5ML Pen Inject 15 mg into the skin once a week. 10/16/24   Webb, Padonda B, FNP    Allergies: Amlodipine     Review of Systems  Updated Vital Signs BP 119/87 (BP Location: Right Arm)   Pulse (!) 122   Temp 98.4 F (36.9 C) (Oral)   Resp 20   Wt 113.4 kg   SpO2 100%   BMI 34.87 kg/m   Physical Exam Vitals and nursing note reviewed.  Constitutional:      Appearance: He is well-developed.  HENT:     Head: Normocephalic and atraumatic.  Eyes:     Pupils: Pupils are equal, round, and reactive to light.  Neck:     Vascular: No JVD.  Cardiovascular:     Rate and Rhythm: Normal rate and regular rhythm.     Heart sounds: No murmur heard.    No friction rub. No gallop.  Pulmonary:     Effort: No respiratory distress.     Breath sounds: No wheezing.  Abdominal:     General: There is no distension.      Tenderness: There is no abdominal tenderness. There is no guarding or rebound.  Musculoskeletal:        General: Normal range of motion.     Cervical back: Normal range of motion and neck supple.     Comments: Pulse motor and sensation are intact to the left upper extremity.  No significant weakness.  Pain and tenderness to the trapezius.  No obvious midline spinal tenderness step-offs or deformities.  He rotates his head freely without obvious discomfort.  Skin:    Coloration: Skin is not pale.     Findings: No rash.  Neurological:     Mental Status: He is alert and oriented to person, place, and time.  Psychiatric:        Behavior: Behavior normal.     (all labs ordered are listed, but only abnormal results are displayed) Labs Reviewed - No data to display  EKG: None  Radiology: No results found.   Procedures   Medications Ordered in the ED  ketorolac  (TORADOL ) 15 MG/ML injection 15 mg (has no administration in time range)  acetaminophen  (TYLENOL ) tablet 1,000 mg (has no administration in time range)                                    Medical Decision Making Risk OTC drugs. Prescription drug management.   48 yo M with a chief complaints of left arm pain.  This been an ongoing issue for him.  He feels like maybe it got worse over the past week.  No recent injury.  They are in the process of scheduling him an MRI.  Exam is reassuring here.  Trial of steroids.  Sports medicine follow-up.  Patient was documented to having a heart rate in the 120s.  On my exam on manual count patient's heart rate normal.  7:59 AM:  I have discussed the diagnosis/risks/treatment options with the patient.  Evaluation and diagnostic testing in the emergency department does not suggest an emergent condition requiring admission or immediate intervention beyond what has been performed at this time.  They will follow up with PCP. We also discussed returning to the ED immediately if new or  worsening sx occur. We discussed the sx which are most concerning (e.g., sudden worsening pain, fever, inability to tolerate  by mouth) that necessitate immediate return. Medications administered to the patient during their visit and any new prescriptions provided to the patient are listed below.  Medications given during this visit Medications  ketorolac  (TORADOL ) 15 MG/ML injection 15 mg (has no administration in time range)  acetaminophen  (TYLENOL ) tablet 1,000 mg (has no administration in time range)     The patient appears reasonably screen and/or stabilized for discharge and I doubt any other medical condition or other [facility] requiring further screening, evaluation, or treatment in the ED at this time prior to discharge.       Final diagnoses:  Trapezius strain, left, initial encounter    ED Discharge Orders          Ordered    methylPREDNISolone  (MEDROL  DOSEPAK) 4 MG TBPK tablet        11/29/24 0754    diclofenac  Sodium (VOLTAREN ) 1 % GEL  4 times daily        11/29/24 0754               Emil Share, DO 11/29/24 [phone]

## 2024-12-03 ENCOUNTER — Ambulatory Visit (HOSPITAL_COMMUNITY)

## 2024-12-03 ENCOUNTER — Ambulatory Visit (HOSPITAL_COMMUNITY)
Admission: RE | Admit: 2024-12-03 | Discharge: 2024-12-03 | Disposition: A | Discharge status: Home / Self Care | Source: Ambulatory Visit

## 2024-12-03 DIAGNOSIS — R200 Anesthesia of skin: Secondary | ICD-10-CM | POA: Diagnosis not present

## 2024-12-03 DIAGNOSIS — R202 Paresthesia of skin: Secondary | ICD-10-CM | POA: Insufficient documentation

## 2024-12-04 ENCOUNTER — Ambulatory Visit: Payer: Self-pay

## 2024-12-10 ENCOUNTER — Encounter: Admitting: Student

## 2024-12-24 ENCOUNTER — Encounter: Payer: Self-pay | Admitting: Student

## 2024-12-24 ENCOUNTER — Ambulatory Visit: Admitting: Student

## 2024-12-24 ENCOUNTER — Other Ambulatory Visit

## 2024-12-24 VITALS — BP 128/84 | HR 127 | Temp 98.8°F | Resp 16 | Ht 71.0 in | Wt 244.0 lb

## 2024-12-24 DIAGNOSIS — G4733 Obstructive sleep apnea (adult) (pediatric): Secondary | ICD-10-CM

## 2024-12-24 DIAGNOSIS — E291 Testicular hypofunction: Secondary | ICD-10-CM

## 2024-12-24 DIAGNOSIS — S161XXA Strain of muscle, fascia and tendon at neck level, initial encounter: Secondary | ICD-10-CM

## 2024-12-24 DIAGNOSIS — Z23 Encounter for immunization: Secondary | ICD-10-CM | POA: Diagnosis not present

## 2024-12-24 DIAGNOSIS — R200 Anesthesia of skin: Secondary | ICD-10-CM | POA: Diagnosis not present

## 2024-12-24 DIAGNOSIS — G8929 Other chronic pain: Secondary | ICD-10-CM | POA: Diagnosis not present

## 2024-12-24 DIAGNOSIS — I10 Essential (primary) hypertension: Secondary | ICD-10-CM | POA: Diagnosis not present

## 2024-12-24 DIAGNOSIS — R000 Tachycardia, unspecified: Secondary | ICD-10-CM | POA: Diagnosis not present

## 2024-12-24 DIAGNOSIS — E782 Mixed hyperlipidemia: Secondary | ICD-10-CM

## 2024-12-24 DIAGNOSIS — E1165 Type 2 diabetes mellitus with hyperglycemia: Secondary | ICD-10-CM

## 2024-12-24 DIAGNOSIS — R202 Paresthesia of skin: Secondary | ICD-10-CM

## 2024-12-24 DIAGNOSIS — M542 Cervicalgia: Secondary | ICD-10-CM | POA: Diagnosis not present

## 2024-12-24 DIAGNOSIS — E559 Vitamin D deficiency, unspecified: Secondary | ICD-10-CM | POA: Diagnosis not present

## 2024-12-24 MED ORDER — MELOXICAM 15 MG PO TABS: 15.0000 mg | ORAL_TABLET | Freq: Every day | ORAL | 0 refills | Status: AC | PRN

## 2024-12-24 MED ORDER — BACLOFEN 10 MG PO TABS: 10.0000 mg | ORAL_TABLET | Freq: Three times a day (TID) | ORAL | 1 refills | Status: AC

## 2024-12-24 MED ORDER — MOUNJARO 15 MG/0.5ML ~~LOC~~ SOAJ: 15.0000 mg | SUBCUTANEOUS | 2 refills | Status: AC

## 2024-12-24 MED ORDER — METHYLPREDNISOLONE 4 MG PO TBPK: ORAL_TABLET | ORAL | 0 refills | Status: AC

## 2024-12-24 NOTE — Assessment & Plan Note (Signed)
 hgba1c acceptable, minimize simple carbs. Increase exercise as tolerated. Continue current meds  On statin On arb

## 2024-12-24 NOTE — Assessment & Plan Note (Signed)
 Well controlled, no changes to meds. Encouraged heart healthy diet such as the DASH diet and exercise as tolerated.

## 2024-12-24 NOTE — Assessment & Plan Note (Signed)
 Supplement and monitor

## 2024-12-24 NOTE — Assessment & Plan Note (Signed)
He is using CPAP.  Recommend continuation.

## 2024-12-24 NOTE — Progress Notes (Signed)
 Chief Complaint  Patient presents with   Transitions Of Care    Patient is here to transfer care to the new provider   Neck Pain    Left side numbness, neck pain (for about 1 year) and elbow pain. Has tingling sensations throughout arm and hand on left side for about 5 weeks.        SUBJECTIVE: HPI: Dylan Munoz. is an 49 y.o.male who is being seen for [facility] The appointment Single parent, I adult son. 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  Following with urology.  He reports chronic neck pain for years that has recently become intolerable. Pain radiates from the neck into the left hand with numbness and tingling, and he can barely feel his left fingers. Last cervical spine imaging was in 2019. He denies recent falls or injuries.  He has type 2 diabetes treated with metformin  and Mounjaro . He does not check glucose at home.   He has anxiety treated with hydroxyzine  as needed, which he uses about three times per week. He has not used Lexapro  for several years.  He has vitamin D  deficiency and is not taking vitamin D  supplements.  He drinks alcohol  occasionally and uses dip tobacco. 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  Patient denies fever, chills, SOB, CP, palpitations, dyspnea, edema, HA, vision changes, N/V/D, abdominal pain, urinary symptoms, rash, weight changes, and recent illness or hospitalizations.   Diabetes: - Checking glucose at home: denies - Compliant with medications - Denies symptoms of hypoglycemia, polyuria, polydipsia, numbness extremities, foot ulcers/trauma, visual changes, wounds that are not healing, medication side effects   Wt Readings from Last 3 Encounters:  12/24/24 244 lb (110.7 kg)  11/29/24 250 lb (113.4 kg)  10/22/24 255 lb (115.7 kg)     Patient denies fever, chills, SOB, CP, palpitations, dyspnea, edema, HA, vision changes, N/V/D, abdominal pain, urinary  symptoms, rash, weight changes, and recent illness or hospitalizations.   Past Medical History:  Diagnosis Date   Anal fissure    Anemia    B12 deficiency    Diabetes (HCC)    type 2   H. pylori infection    High cholesterol    Hypertension    Hypomagnesemia    Morbid obesity (HCC) 04/17/2016   RSV (respiratory syncytial virus infection)    Severe obstructive sleep apnea 04/17/2016   AHI 14.9 with desats <88% for >90 mins on SNAP report 04/02/18. See scanned document.   Testosterone  deficiency 04/19/2016   Vitamin D  deficiency 04/19/2016   Past Surgical History:  Procedure Laterality Date   KNEE ARTHROSCOPY W/ ACL RECONSTRUCTION     Family History  Problem Relation Age of Onset   Heart disease Father    Diabetes Paternal Uncle    Colon cancer Neg Hx    Esophageal cancer Neg Hx    Prostate cancer Neg Hx    Allergies[1] Current Medications[2]  OBJECTIVE: BP 128/84 (BP Location: Right Arm, Patient Position: Sitting)   Pulse (!) 127   Temp 98.8 F (37.1 C) (Oral)   Resp 16   Ht 5' 11 (1.803 m)   Wt 244 lb (110.7 kg)   SpO2 97%   BMI 34.03 kg/m  General:  well developed, well nourished, in no apparent distress Skin:  no significant moles, warts, or growths Nose:  nares patent, septum midline, mucosa normal Throat/Pharynx:  lips and gingiva without lesion; tongue  and uvula midline; non-inflamed pharynx; no exudates or postnasal drainage Lungs:  clear to auscultation, breath sounds equal bilaterally, no respiratory distress Cardio:  regular rate and rhythm, no LE edema or bruits Musculoskeletal:  symmetrical muscle groups noted without atrophy or deformity Neuro:  gait normal Psych: well oriented with normal range of affect and appropriate judgment/insight  ASSESSMENT/PLAN: Type 2 diabetes mellitus with hyperglycemia, without long-term current use of insulin (HCC) - Plan: tirzepatide  (MOUNJARO ) 15 MG/0.5ML Pen  Primary hypertension  Mixed hyperlipidemia  Neck  pain, chronic - Plan: Ambulatory referral to Neurosurgery, MR CERVICAL SPINE W WO CONTRAST, Ambulatory referral to Pain Clinic  Cervicalgia - Plan: Ambulatory referral to Neurosurgery, MR CERVICAL SPINE W WO CONTRAST, Ambulatory referral to Pain Clinic  Numbness and tingling in left arm - Plan: Ambulatory referral to Neurosurgery, MR CERVICAL SPINE W WO CONTRAST, Ambulatory referral to Pain Clinic  Strain of neck muscle, initial encounter - Plan: Ambulatory referral to Neurosurgery, meloxicam  (MOBIC ) 15 MG tablet, baclofen  (LIORESAL ) 10 MG tablet, Ambulatory referral to Pain Clinic  Needs flu shot - Plan: Flu vaccine trivalent PF, 6mos and older(Flulaval,Afluria,Fluarix,Fluzone)  Vitamin D  deficiency  Severe obstructive sleep apnea  Sinus tachycardia   Chronic neck pain with left arm numbness and tingling Chronic neck pain with new left arm symptoms likely due to cervical spine issues. 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 Current symptoms suggest possible nerve involvement.  Previous treatments ineffective, Pt states he has done PT with no improvement in symptoms.  - Ordered MRI of cervical spine. - Referred to neurosurgery for evaluation. - Rx Medrol  Dosepak, Mobic  prn, fbaclofen as a muscle relaxant. - Referred to pain management for chronic pain evaluation. - Recommended Tylenol  as needed for pain.  Type 2 diabetes mellitus [id number] acceptable, minimize simple carbs. Increase exercise as tolerated. Continue current meds   Primary hypertension Well controlled, no changes to meds. Encouraged heart healthy diet such as the DASH diet and exercise as tolerated.    Mixed hyperlipidemia Managed with statin therapy. - Continue statin therapy.Encourage heart healthy diet such as MIND or DASH diet, increase exercise, avoid trans fats, simple carbohydrates and processed foods, consider a krill or fish or flaxseed oil cap daily.    Vitamin D  deficiency No current  supplementation reported. - Start vitamin D  1000 IU daily. - Will consider prescription vitamin D  if deficiency is severe.  Anxiety Managed with hydroxyzine  as needed. Lexapro  was previously prescribed but not currently taken.  - Continue hydroxyzine  as needed.  General Health Maintenance Routine health maintenance discussed, including vaccinations and screenings. - Administered flu vaccine. - Plan for physical exam and blood work in 4-6 weeks.  Patient should return in 4 weeks for CPE The patient voiced understanding and agreement to the plan.   Harlene LITTIE Jolly, DNP, AGNP-C 12/24/2024      [1]  Allergies Allergen Reactions   Amlodipine  Other (See Comments)    Constipation  [2]  Current Outpatient Medications:    acetaminophen  (TYLENOL ) 325 MG tablet, Take 2 tablets (650 mg total) by mouth every 6 (six) hours as needed., Disp: 36 tablet, Rfl: 0   Alcohol  Swabs (ALCOHOL  PREP) PADS, Use to clean skin and vials for testosterone  injections., Disp: 100 each, Rfl: prn   AMBULATORY NON FORMULARY MEDICATION, Please set CPAP to auto-titration from 10-20.  \  Adjust mask fit.  Send 30 day compliance report 30 days after change is made.   Send to [facility], Disp: 1 each, Rfl: 0   atorvastatin  (LIPITOR)  80 MG tablet, Take 1 tablet (80 mg total) by mouth daily., Disp: 90 tablet, Rfl: 0   diclofenac  Sodium (VOLTAREN ) 1 % GEL, Apply 4 g topically 4 (four) times daily., Disp: 100 g, Rfl: 0   hydrOXYzine  (VISTARIL ) 25 MG capsule, Take 1 capsule (25 mg total) by mouth every 8 (eight) hours as needed for anxiety., Disp: 30 capsule, Rfl: 3   losartan  (COZAAR ) 50 MG tablet, Take 1 tablet (50 mg total) by mouth daily., Disp: 90 tablet, Rfl: 2   metFORMIN  (GLUCOPHAGE -XR) 500 MG 24 hr tablet, Take 1 tablet (500 mg total) by mouth daily with breakfast., Disp: 90 tablet, Rfl: 2   baclofen  (LIORESAL ) 10 MG tablet, Take 1 tablet (10 mg total) by mouth 3 (three) times daily., Disp: 30 each, Rfl: 1    meloxicam  (MOBIC ) 15 MG tablet, Take 1 tablet (15 mg total) by mouth daily as needed for pain., Disp: 30 tablet, Rfl: 0   methylPREDNISolone  (MEDROL  DOSEPAK) 4 MG TBPK tablet, Day 1: 8mg  before breakfast, 4 mg after lunch, 4 mg after supper, and 8 mg at bedtime Day 2: 4 mg before breakfast, 4 mg after lunch, 4 mg  after supper, and 8 mg  at bedtime Day 3:  4 mg  before breakfast, 4 mg  after lunch, 4 mg after supper, and 4 mg  at bedtime Day 4: 4 mg  before breakfast, 4 mg  after lunch, and 4 mg at bedtime Day 5: 4 mg  before breakfast and 4 mg at bedtime Day 6: 4 mg  before breakfast, Disp: 1 each, Rfl: 0   tirzepatide  (MOUNJARO ) 15 MG/0.5ML Pen, Inject 15 mg into the skin once a week., Disp: 2 mL, Rfl: 2

## 2024-12-24 NOTE — Assessment & Plan Note (Signed)
 LDL goal <70 Tolerating Statin. Encourage heart healthy diet such as MIND or DASH diet, increase exercise, avoid trans fats, simple carbohydrates and processed foods, consider a krill or fish or flaxseed oil cap daily.

## 2024-12-24 NOTE — Assessment & Plan Note (Signed)
" °  Chronic neck pain with left arm numbness and tingling Chronic neck pain with new left arm symptoms likely due to cervical spine issues. 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 Current symptoms suggest possible nerve involvement.  Previous treatments ineffective, Pt states he has done PT with no improvement in symptoms.  - Ordered MRI of cervical spine. - Referred to neurosurgery for evaluation. - Rx Medrol  Dosepak, Mobic  prn, fbaclofen as a muscle relaxant. - Referred to pain management for chronic pain evaluation. - Recommended Tylenol  as needed for pain. "

## 2024-12-25 LAB — TESTOSTERONE: Testosterone: 412 ng/dL (ref 264–916)

## 2024-12-25 LAB — PSA: Prostate Specific Ag, Serum: 1.1 ng/mL (ref 0.0–4.0)

## 2024-12-25 LAB — ESTRADIOL: Estradiol: 45.7 pg/mL — ABNORMAL HIGH (ref 7.6–42.6)

## 2024-12-28 ENCOUNTER — Ambulatory Visit: Payer: Self-pay | Admitting: Urology

## 2025-01-03 ENCOUNTER — Other Ambulatory Visit: Payer: Self-pay | Admitting: Student

## 2025-01-18 ENCOUNTER — Ambulatory Visit (HOSPITAL_BASED_OUTPATIENT_CLINIC_OR_DEPARTMENT_OTHER)

## 2025-01-21 ENCOUNTER — Encounter: Admitting: Student

## 2025-01-28 ENCOUNTER — Ambulatory Visit: Admitting: Urology
